# Patient Record
Sex: Male | Born: 1935 | Race: White | Hispanic: No | Marital: Married | Smoking: Never smoker
Health system: Southern US, Community
[De-identification: ages and names within clinical notes are randomized; demographics above are authoritative.]

## PROBLEM LIST (undated history)

## (undated) DIAGNOSIS — M1990 Unspecified osteoarthritis, unspecified site: Secondary | ICD-10-CM

## (undated) DIAGNOSIS — I10 Essential (primary) hypertension: Secondary | ICD-10-CM

## (undated) DIAGNOSIS — E785 Hyperlipidemia, unspecified: Secondary | ICD-10-CM

## (undated) DIAGNOSIS — I639 Cerebral infarction, unspecified: Secondary | ICD-10-CM

## (undated) DIAGNOSIS — E119 Type 2 diabetes mellitus without complications: Secondary | ICD-10-CM

## (undated) DIAGNOSIS — Z87898 Personal history of other specified conditions: Secondary | ICD-10-CM

## (undated) HISTORY — DX: Cerebral infarction, unspecified: I63.9

## (undated) HISTORY — PX: COLONOSCOPY: SHX174

## (undated) HISTORY — DX: Unspecified osteoarthritis, unspecified site: M19.90

## (undated) HISTORY — DX: Hyperlipidemia, unspecified: E78.5

## (undated) HISTORY — DX: Essential (primary) hypertension: I10

## (undated) HISTORY — DX: Personal history of other specified conditions: Z87.898

---

## 2001-12-04 ENCOUNTER — Ambulatory Visit (HOSPITAL_COMMUNITY): Admission: RE | Admit: 2001-12-04 | Discharge: 2001-12-04 | Payer: Self-pay | Admitting: Internal Medicine

## 2002-07-03 ENCOUNTER — Other Ambulatory Visit: Admission: RE | Admit: 2002-07-03 | Discharge: 2002-07-03 | Payer: Self-pay | Admitting: Urology

## 2003-05-14 ENCOUNTER — Ambulatory Visit (HOSPITAL_COMMUNITY): Admission: RE | Admit: 2003-05-14 | Discharge: 2003-05-14 | Payer: Self-pay | Admitting: Family Medicine

## 2003-05-17 ENCOUNTER — Ambulatory Visit (HOSPITAL_COMMUNITY): Admission: RE | Admit: 2003-05-17 | Discharge: 2003-05-17 | Payer: Self-pay | Admitting: Family Medicine

## 2005-01-11 ENCOUNTER — Ambulatory Visit: Payer: Self-pay | Admitting: Internal Medicine

## 2005-01-11 ENCOUNTER — Ambulatory Visit (HOSPITAL_COMMUNITY): Admission: RE | Admit: 2005-01-11 | Discharge: 2005-01-11 | Payer: Self-pay | Admitting: Internal Medicine

## 2005-02-23 ENCOUNTER — Ambulatory Visit: Payer: Self-pay | Admitting: Unknown Physician Specialty

## 2005-03-23 ENCOUNTER — Ambulatory Visit: Payer: Self-pay | Admitting: Unknown Physician Specialty

## 2005-03-30 ENCOUNTER — Emergency Department: Payer: Self-pay | Admitting: Emergency Medicine

## 2005-03-30 ENCOUNTER — Other Ambulatory Visit: Payer: Self-pay

## 2007-02-17 ENCOUNTER — Ambulatory Visit (HOSPITAL_COMMUNITY): Admission: RE | Admit: 2007-02-17 | Discharge: 2007-02-17 | Payer: Self-pay | Admitting: Family Medicine

## 2007-04-03 ENCOUNTER — Ambulatory Visit: Payer: Self-pay | Admitting: Cardiovascular Disease

## 2007-04-04 ENCOUNTER — Ambulatory Visit (HOSPITAL_COMMUNITY): Admission: RE | Admit: 2007-04-04 | Discharge: 2007-04-04 | Payer: Self-pay | Admitting: Cardiovascular Disease

## 2007-04-28 ENCOUNTER — Ambulatory Visit: Payer: Self-pay | Admitting: Cardiology

## 2007-04-28 ENCOUNTER — Encounter (HOSPITAL_COMMUNITY): Admission: RE | Admit: 2007-04-28 | Discharge: 2007-05-28 | Payer: Self-pay | Admitting: Cardiovascular Disease

## 2007-06-27 ENCOUNTER — Ambulatory Visit (HOSPITAL_COMMUNITY): Admission: RE | Admit: 2007-06-27 | Discharge: 2007-06-27 | Payer: Self-pay | Admitting: Family Medicine

## 2009-01-05 DIAGNOSIS — R079 Chest pain, unspecified: Secondary | ICD-10-CM | POA: Insufficient documentation

## 2010-06-01 ENCOUNTER — Observation Stay (HOSPITAL_COMMUNITY)
Admission: RE | Admit: 2010-06-01 | Discharge: 2010-06-02 | Disposition: A | Discharge status: Home / Self Care | Payer: Medicare Other | Source: Ambulatory Visit | Attending: Internal Medicine | Admitting: Internal Medicine

## 2010-06-01 DIAGNOSIS — R5383 Other fatigue: Secondary | ICD-10-CM | POA: Insufficient documentation

## 2010-06-01 DIAGNOSIS — M542 Cervicalgia: Secondary | ICD-10-CM | POA: Insufficient documentation

## 2010-06-01 DIAGNOSIS — Z79899 Other long term (current) drug therapy: Secondary | ICD-10-CM | POA: Insufficient documentation

## 2010-06-01 DIAGNOSIS — R209 Unspecified disturbances of skin sensation: Principal | ICD-10-CM | POA: Insufficient documentation

## 2010-06-01 DIAGNOSIS — K3189 Other diseases of stomach and duodenum: Secondary | ICD-10-CM | POA: Insufficient documentation

## 2010-06-01 DIAGNOSIS — Z8673 Personal history of transient ischemic attack (TIA), and cerebral infarction without residual deficits: Secondary | ICD-10-CM | POA: Insufficient documentation

## 2010-06-01 DIAGNOSIS — M1990 Unspecified osteoarthritis, unspecified site: Secondary | ICD-10-CM | POA: Insufficient documentation

## 2010-06-01 DIAGNOSIS — I10 Essential (primary) hypertension: Secondary | ICD-10-CM | POA: Insufficient documentation

## 2010-06-01 DIAGNOSIS — R51 Headache: Secondary | ICD-10-CM | POA: Insufficient documentation

## 2010-06-01 DIAGNOSIS — R5381 Other malaise: Secondary | ICD-10-CM | POA: Insufficient documentation

## 2010-06-02 ENCOUNTER — Observation Stay (HOSPITAL_COMMUNITY): Payer: Medicare Other

## 2010-06-02 ENCOUNTER — Emergency Department (HOSPITAL_COMMUNITY): Payer: Medicare Other

## 2010-06-02 DIAGNOSIS — I059 Rheumatic mitral valve disease, unspecified: Secondary | ICD-10-CM

## 2010-06-02 LAB — COMPREHENSIVE METABOLIC PANEL
ALT: 21 U/L (ref 0–53)
ALT: 22 U/L (ref 0–53)
AST: 18 U/L (ref 0–37)
AST: 19 U/L (ref 0–37)
Albumin: 3.7 g/dL (ref 3.5–5.2)
Albumin: 3.9 g/dL (ref 3.5–5.2)
Alkaline Phosphatase: 39 U/L (ref 39–117)
Alkaline Phosphatase: 41 U/L (ref 39–117)
BUN: 15 mg/dL (ref 6–23)
BUN: 17 mg/dL (ref 6–23)
CO2: 23 mEq/L (ref 19–32)
CO2: 27 mEq/L (ref 19–32)
Calcium: 9 mg/dL (ref 8.4–10.5)
Calcium: 9 mg/dL (ref 8.4–10.5)
Chloride: 104 mEq/L (ref 96–112)
Chloride: 105 mEq/L (ref 96–112)
Creatinine, Ser: 1.18 mg/dL (ref 0.4–1.5)
Creatinine, Ser: 1.24 mg/dL (ref 0.4–1.5)
GFR calc Af Amer: >60 mL/min (ref >60)
GFR calc non Af Amer: 56 mL/min — ABNORMAL LOW (ref >60)
GFR calc non Af Amer: 60 mL/min — ABNORMAL LOW (ref >60)
Glucose, Bld: 121 mg/dL — ABNORMAL HIGH (ref 70–99)
Glucose, Bld: 126 mg/dL — ABNORMAL HIGH (ref 70–99)
Potassium: 3.6 mEq/L (ref 3.5–5.1)
Potassium: 3.7 mEq/L (ref 3.5–5.1)
Sodium: 137 mEq/L (ref 135–145)
Sodium: 141 mEq/L (ref 135–145)
Total Bilirubin: 0.5 mg/dL (ref 0.3–1.2)
Total Bilirubin: 0.6 mg/dL (ref 0.3–1.2)
Total Protein: 6.6 g/dL (ref 6.0–8.3)
Total Protein: 6.9 g/dL (ref 6.0–8.3)

## 2010-06-02 LAB — DIFFERENTIAL
Basophils Absolute: 0 10*3/uL (ref 0.0–0.1)
Eosinophils Relative: 3 % (ref 0–5)
Lymphocytes Relative: 26 % (ref 12–46)
Monocytes Absolute: 0.6 10*3/uL (ref 0.1–1.0)
Monocytes Relative: 8 % (ref 3–12)
Neutro Abs: 5 10*3/uL (ref 1.7–7.7)

## 2010-06-02 LAB — CBC
HCT: 38.6 % — ABNORMAL LOW (ref 39.0–52.0)
HCT: 41.2 % (ref 39.0–52.0)
Hemoglobin: 13.6 g/dL (ref 13.0–17.0)
Hemoglobin: 14.1 g/dL (ref 13.0–17.0)
MCH: 31.1 pg (ref 26.0–34.0)
MCH: 31.8 pg (ref 26.0–34.0)
MCHC: 34.2 g/dL (ref 30.0–36.0)
MCHC: 35.2 g/dL (ref 30.0–36.0)
MCV: 90.2 fL (ref 78.0–100.0)
Platelets: 276 10*3/uL (ref 150–400)
RBC: 4.28 MIL/uL (ref 4.22–5.81)
RDW: 13 % (ref 11.5–15.5)
WBC: 8.1 10*3/uL (ref 4.0–10.5)

## 2010-06-02 LAB — PROTIME-INR
INR: 0.92 (ref 0.00–1.49)
INR: 0.9 (ref 0.00–1.49)
Prothrombin Time: 12.6 seconds (ref 11.6–15.2)
Prothrombin Time: 12.4 seconds (ref 11.6–15.2)

## 2010-06-02 LAB — POCT CARDIAC MARKERS
CKMB, poc: <1 ng/mL — ABNORMAL LOW (ref 1.0–8.0)
Myoglobin, poc: 69.1 ng/mL (ref 12–200)
Troponin i, poc: <0.05 ng/mL (ref 0.00–0.09)
Troponin i, poc: <0.05 ng/mL (ref 0.00–0.09)

## 2010-06-02 LAB — CARDIAC PANEL(CRET KIN+CKTOT+MB+TROPI)
CK, MB: 2.3 ng/mL (ref 0.3–4.0)
Total CK: 114 U/L (ref 7–232)
Troponin I: 0.01 ng/mL (ref 0.00–0.06)

## 2010-06-02 LAB — URINALYSIS, ROUTINE W REFLEX MICROSCOPIC
Hgb urine dipstick: NEGATIVE
Ketones, ur: NEGATIVE mg/dL
Nitrite: NEGATIVE
Protein, ur: NEGATIVE mg/dL
Specific Gravity, Urine: 1.01 (ref 1.005–1.030)

## 2010-06-02 LAB — LIPID PANEL
Cholesterol: 219 mg/dL — ABNORMAL HIGH (ref 0–200)
HDL: 56 mg/dL (ref >39)
LDL Cholesterol: 146 mg/dL — ABNORMAL HIGH (ref 0–99)
Total CHOL/HDL Ratio: 3.9 RATIO
Triglycerides: 84 mg/dL (ref <150)
VLDL: 17 mg/dL (ref 0–40)

## 2010-06-11 NOTE — Discharge Summary (Signed)
NAMEKANIEL, Mark Brady             ACCOUNT NO.:  [id number]  MEDICAL RECORD NO.:  [id number]           PATIENT TYPE:  I  LOCATION:  A338                          FACILITY:  [facility]  PHYSICIAN:  Alaysiah Browder L. Lendell [name], MDDATE OF BIRTH:  Feb 29, 1930  DATE OF ADMISSION:  06/01/2010 DATE OF DISCHARGE:  02/17/2012LH                              DISCHARGE SUMMARY   DISCHARGE DIAGNOSES: 1. Transient left arm numbness, possible transient ischemic attack. 2. Untreated hypertension. 3. 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 4. Osteoarthritis.  DISCHARGE MEDICATIONS: 1. Aspirin 325 mg a day. 2. Hydrochlorothiazide 12.5 mg a day. 3. Multivitamin a day. 4. Tylenol 650 mg p.o. q.4 h. p.r.n. pain. 5. Stop Aleve.  CONDITION:  Stable.  FOLLOWUP:  Follow up with Dr. Lubertha South in 1-2 weeks.  His fasting lipid panel and echocardiogram are pending.  Please check basic metabolic panel and blood pressure.  ACTIVITY:  Ad lib.  DIET:  Should be heart healthy.  CONSULTATIONS:  None.  PROCEDURES:  None.  LABORATORY DATA:  CBC normal.  PT/PTT normal.  Complete metabolic panel unremarkable.  Cardiac enzymes negative.  Urinalysis negative.  DIAGNOSTICS:  EKG shows possible left atrial enlargement. Echocardiogram pending.  CT of the head without contrast showed nothing acute, chronic microvascular ischemic change.  Chest x-ray showed nothing acute.  MRI of the brain without contrast showed nothing acute, old infarct of affecting the left basal ganglia and centrum semiovale. MRA of the brain showed no major vessel occlusion.  Distal right vertebral artery diminutive.  MRI of the C-spine showed small focal central disk protrusion at C2-3, bilateral foraminal stenosis, right greater than left in C3-4, mild-to-moderate left foraminal stenosis at C4-5, diffuse bulging annulus and osteophytic ridging at C5-6, mild right foraminal encroachment, shallow broad-based disk protrusion at C6- 7  with mild left foraminal encroachment.  Carotid Dopplers showed less than 50% carotid stenosis.  HISTORY AND HOSPITAL COURSE:  Please see H and P for details.  Mark Brady is a 77 year old white male with untreated hypertension, who presented with about a half an hour of left arm numbness and diaphoresis.  His symptoms had resolved by the time he was evaluated by the hospitalist.  He was worked up for TIA and echocardiogram and fasting lipids at this time pending.  His blood pressure was high when he arrived and remained 160-180 systolic.  He is very resistant to taking any medications but agrees to try an aspirin a day and he will think about taking hydrochlorothiazide but at this point is unsure whether he will fill the prescription.  He is said on getting his blood pressure down with vitamins.  I told him that this would increase his risk of stroke.  He does agree to follow up with Dr. Gerda Diss, next week. If he does in fact take hydrochlorothiazide he will need a basic metabolic check and blood pressure monitoring.  He reported that he has nosebleeds with aspirin but would try it again.  He had some osteoarthritis and mild-to-moderate foraminal stenosis on MRI of the neck, but nothing to explain his symptoms.  He had no recurrence of his symptoms  and had a normal neurologic exam.  He is requesting discharge at this time and his results may not be back for a day or more.  Since he has been stable, I will have him follow up these results with his primary care physician.     Mark Brady L. Lendell [name], MD     CLS/MEDQ  D:  06/02/2010  T:  06/03/2010  Job:  [id number]  cc:   [name], M.D. Fax: [phone]  Electronically Signed by Crista Curb MD on 06/11/2010 09:25:50 PM

## 2010-07-02 NOTE — H&P (Signed)
Mark Brady, Mark Brady             ACCOUNT NO.:  [id number]  MEDICAL RECORD NO.:  [id number]           PATIENT TYPE:  LOCATION:                                 FACILITY:  PHYSICIAN:  [name], MD     DATE OF BIRTH:  Feb 04, 1930  DATE OF ADMISSION:  06/02/2010 DATE OF DISCHARGE:  LH                             HISTORY & PHYSICAL   PRIMARY CARE PHYSICIAN:  [name], MD  ADMISSION DIAGNOSES: 1. Left arm tingling, numbness with diaphoresis, possible transient     ischemic attack. 2. High blood pressure, not on medications. 3. Indigestion, possible gastroesophageal reflux disease.  CHIEF COMPLAINT:  Left arm numbness tonight.  HISTORY OF PRESENT ILLNESS:  The patient is a 77 year old Caucasian male who has a history of hypertension, but is not on any medications, he is otherwise in good state of health, who was in his usual state of health until about 10:15 last night when he went to bed and at about 10:30, he woke up, feeling hot, diaphoretic.  According to his wife, he was clammy, he felt weak.  He woke up with some burping and more importantly had left arm tingling and shoulder pain.  The tingling was, he said from the hands and the fingers up to the mid upper arm.  He also had left shoulder pain.  He said he was in lying on that arm.  He was actually lying on the right side.  He does sometimes get pain in the neck and some left hand tingling, but this one was more intense compared to his previous episodes.  He did not have any focal weakness or any seizure activity, however, felt a generalized weakness.  He walked around.  He went outside, opened the door to get some fresh air.  He then also felt some indigestion.  Denies any headaches, speech difficulty, or vision difficulties.  All these symptoms resolved after about half an hour.  He mentioned that he did not have any chest pain or shortness of breath. For his indigestion, he has been taking Tums with which he  has achieved relief of the complaint.  MEDICATIONS AT HOME:  He does not take anything scheduled.  He takes Tums as needed.  He took Aleve a couple of days this week for arthritis, otherwise denies any other medication.  He also took aspirin tonight.  ALLERGIES:  No known drug allergies.  PAST MEDICAL HISTORY:  Just history of hypertension for which he is not on any treatment at this time.  PAST SURGICAL HISTORY:  Vasectomy.  SOCIAL HISTORY:  Lives in [place] with his wife.  He is semi-retired. He works in a [employer] developer.  He denies smoking, alcohol, or illicit drug use.  He is usually independent with his daily activities.  He walks every morning about 3/4 of a mile to a mile and does not get any current chest pain or shortness of breath.  FAMILY HISTORY:  Unremarkable per the patient.  REVIEW OF SYSTEMS:  GENERAL:  Positive for weakness and malaise.  HEENT: Unremarkable.  CARDIOVASCULAR:  As in HPI.  RESPIRATORY: As in HPI.  GI: Unremarkable.  GU: Unremarkable.  NEUROLOGICAL:  As in HPI. PSYCHIATRIC:  Unremarkable.  DERMATOLOGIC:  Unremarkable.  Other systems reviewed and found to be negative.  PHYSICAL EXAMINATION:  VITAL SIGNS:  Temperature of 98.8; blood pressure when he came in was 183/91, subsequently 160s/80s; heart rate 80; respiratory rate 18; and saturation 98% on room air. GENERAL:  An elderly thin white male, in no distress, slightly anxious. HEENT:  Head is normocephalic and atraumatic.  Pupils are equal and reacting.  No pallor.  No icterus.  Oral mucous membranes moist.  No oral lesions are noted. NECK:  Soft and supple.  No reproducibility of symptoms with flexion, extension, or rotation of the neck.  No thyromegaly is present.  No cervical, supraclavicular, or inguinal lymphadenopathy is present. LUNGS:  Clear to auscultation bilaterally with no wheezing, rales, or rhonchi. CARDIOVASCULAR:  S1 and S2 is normal and regular.  Peripheral pulses  are palpable.  No S3 or S4.  No JVD.  No pedal edema.  No bruits. ABDOMEN:  Soft, nontender, and nondistended.  Bowel sounds are present. No masses or organomegaly is appreciated. MUSCULOSKELETAL:  Normal muscle mass and tone. NEUROLOGIC:  He is alert and oriented x3.  Cranial nerves are intact. Motor strength equal bilaterally.  No pronator drift identified. Dysdiadochokinesis was about equal bilaterally.  Finger-to-nose was a little bit unsteady on the left side.  Sensory exam unremarkable. SKIN:  Does not reveal any rashes.  LABORATORY DATA:  His CBC was benign and unremarkable.  His CMET was also unremarkable.  UA was clear.  Cardiac enzymes were negative.  IMAGING STUDIES:  He had a CT head which showed chronic microvascular ischemic changes without any acute findings.  He had a chest x-ray which showed no acute disease.  He had an EKG which showed sinus rhythm at 77, appears to be a possible left axis deviation.  No Q-waves.  No concerning ST changes, possible RBBBs noted.  No older EKGs available for comparison.  There was a cardiology note from 2008, which said that the EKG was essentially normal with possible left atrial enlargement.  ASSESSMENT:  This is a 77 year old Caucasian male who has high blood pressure for which he does not seek treatment, presents with left arm numbness, tingling, and some diaphoresis.  The exact reason for his symptomatology is not entirely clear.  He could have definitely had a transient ischemic attack.  He could have some cervical disc problems, it could be causing his symptoms.  However, he tells me that he has never experienced the diaphoresis ever in the past.  Denies any chest pain.  I do not think this is any kind of coronary ischemia, although it is difficult to rule out.  His indigestion is probably acid reflux.  PLAN: 1. Left arm numbness, possible TIA.  We will go ahead and do a TIA     workup.  Put him on aspirin and see what the  workup shows.  We will     also get an MRI of his cervical spine to see if there is any     disease which could be causing symptomatology. 2. Diaphoresis and indigestion.  We will rule him out for acute     coronary syndrome.  We will get an echocardiogram as a part of the     TIA workup.  EKG will be repeated, although I suspect this is     probably not cardiac.  He has had 2 stress tests, one in 2005  and     the other in 2008, both of which were normal and his EF was normal     as well. 3. He will be put on Zantac for his indigestion. 4. High blood pressure.  We will monitor his blood pressures during     hospitalization.  I would not start him on anything at this point     depending on what his blood pressures do over the next 24 hours.     Consideration to treatment can be given.  At this point, there is no reason for any inpatient consultation on this patient unless symptoms get worse, recur, or any of his testings come abnormal.  I think once the testing comes back okay, he can be safely discharged to the care of his PCP who can arrange a referral to specialists as needed. DVT prophylaxis will be initiated.  Further management decisions will depend on results of further testing and patient's response to treatment.  [name], MD     GK/MEDQ  D:  06/02/2010  T:  06/02/2010  Job:  [id number]  cc:   [name], M.D. Fax: [phone]  Electronically Signed by [name] MD on 07/02/2010 06:29:26 AM

## 2010-08-29 NOTE — Assessment & Plan Note (Signed)
[facility]                       [facility] OFFICE NOTE   NAME:Mark Brady, Mark Brady                    MRN:          [id number]  DATE:04/03/2007                            DOB:          10-Apr-1930    The patient is a pleasant 77 year old patient of Dr. Gerda Diss.  He is  referred for chest pain.   The pain is atypical.  It has been going on for a few months.  It is  actually described more as left lower quadrant pain.  The pain is  clearly exacerbated by eating.  It was can sometimes be followed by a  lot of belching.  There is no previous history of reflux or peptic ulcer  disease.  No history of diverticulitis.  The patient recently has been  placed on Cipro for question prostatitis.   From a cardiac perspective, he has not had known coronary artery  disease, rheumatic disease, or valvular disease.  He does not recall  having any recent workup. He is generally healthy.  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  He has not had any significant palpitations or  syncope.  There has been no PND, orthopnea, and no diaphoresis.  The  left lower quadrant pain is actually somewhat worse over the last few  weeks.  He has not had any diarrhea or melena.  He had a previous  colonoscopy back in 2004, but no recent GI workup.   REVIEW OF SYSTEMS:  Otherwise negative.   PAST MEDICAL HISTORY:  Benign.  He has never been hospitalized.  He has  not had surgery.  He had a colonoscopy back in 2004.   ALLERGIES:  No known drug allergies.   MEDICATIONS:  1. Vitamin C.  2. Androxy.  3. Arthrozyme.  4. Omega vitamins.  5. Cipro 500 mg b.i.d. lately.   SOCIAL HISTORY:  The patient has been married three times.  Unfortunately one of his daughters died last year and he is helping to  raise two of his grandchildren.  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  He enjoys walking and fishing.   FAMILY HISTORY:  Unremarkable.  Father died at age 18 of  natural causes.  Mother died of a hemorrhage in her 8's.   REVIEW OF SYSTEMS:  Otherwise negative.   PHYSICAL EXAMINATION:  GENERAL:  Remarkable for a healthy appearing,  elderly white male in no distress.  VITAL SIGNS:  Weight is 185, blood pressure 150/80, pulse 73 and  regular.  HEENT:  Unremarkable.  Carotids normal without bruit.  No  lymphadenopathy, thyromegaly, or JVP elevation.  LUNGS:  Clear with good diaphragmatic motion.  No wheezing.  HEART:  S1 and S2 normal heart sounds.  PMI normal.  ABDOMEN:  Benign.  Bowel sounds positive.  I cannot elicit any pain in  the left lower quadrant.  There are no masses, no AAA, no bruit, and no  hepatosplenomegaly or hepatojugular reflux.  EXTREMITIES:  Distal pulses are intact.  No edema.  NEUROLOGY:  Nonfocal.  SKIN:  Warm and dry.  No muscular weakness.   EKG is essentially normal  with possible left atrial enlargement.   IMPRESSION:  1. Chest pain actually more left lower quadrant pain.  I doubt that      this is angina, however, given his age it may be an unusual      presentation.  I think it is reasonable to proceed with a stress      Myoview.  2. Left lower quadrant pain exacerbated by food, more likely to be      diverticulitis.  We will order a CT of the abdomen with oral      contrast.  Follow up with Dr. Gerda Diss.  3. Arthritis.  Continue Arthrozyme daily.  Continue walking program.  4. Prostatitis.  Currently on Cipro.  Consider addition of Flomax or      Avodart.  Follow up with urologist and primary M.D. for digital      exams. Overall, I think that the patient is doing well and I      suspect his stress test will be nonischemic as his pain clearly      seems more localized to the left lower quadrant and exacerbated by      food.     Noralyn Pick. Eden Emms, MD, [facility]  Electronically Signed    PCN/MedQ  DD: 04/03/2007  DT: 04/04/2007  Job #: [id number]   cc:   [name], M.D.

## 2010-09-01 NOTE — Procedures (Signed)
NAMEJERROL, Mark Brady NO.:  [id number]   MEDICAL RECORD NO.:  [id number]                  PATIENT TYPE:   LOCATION:                                       FACILITY:   PHYSICIAN:  [name], M.D.             DATE OF BIRTH:   DATE OF PROCEDURE:  05/14/2003  DATE OF DISCHARGE:                                    STRESS TEST   INDICATION FOR TEST:  This patient is a 77 year old white male with atypical  chest discomfort.   STRESS TEST:  Stress test was performed at standard protocol.  Resting EKG  revealed normal sinus rhythm with no significant ST-T changes.  The patient  tolerated the first stage well.  Into the second stage, he began to develop  some shortness of breath.  He had an occasional PVC during the second stage  also.  The patient experienced no chest discomfort.  At the start of the  third stage, the patient had reached a heart rate of 129 which surpassed his  __________ max predicted heart rate of 127.  At this point, there was quite  a bit of artifact.  Most useful was the EKG obtained immediately after  cessation of the test.  This EKG revealed no significant ST changes.  At  0.08 seconds past the J point, the slope was sharply ascending.  Review of  the last EKG obtained before stopping the stress test showed the computer  reading some significant ST segment changes; however, this appears to be  artifact and not actual.  The accurate tracing shows a sharply ascending ST  segment at 0.08 second past the J point, and, therefore, a within normal  limit test.   IMPRESSION:  Negative adequate stress test.   PLAN:  The patient was advised to continue with other therapies as  recommended.      ___________________________________________                                            [name], M.D.   WSL/MEDQ  D:  05/25/2003  T:  05/26/2003  Job:  [id number]

## 2010-09-01 NOTE — Op Note (Signed)
Mark Brady, Mark Brady                       ACCOUNT NO.:  [id number]   MEDICAL RECORD NO.:  [id number]                   PATIENT TYPE:  AMB   LOCATION:  DAY                                  FACILITY:  [facility]   PHYSICIAN:  Gerrit Friends. Rourk, M.D.               DATE OF BIRTH:  June 25, 1929   DATE OF PROCEDURE:  12/04/2001  DATE OF DISCHARGE:                                 OPERATIVE REPORT   PROCEDURE:  Screening colonoscopy.   ENDOSCOPIST:  Gerrit Friends. Rourk, M.D.   INDICATIONS FOR PROCEDURE:  The patient is a 77 year old gentleman devoid of  any lower GI tract symptoms.  No family history of colorectal neoplasia.  He  has never had his lower GI tract imaged.  Not referred for screening  colonoscopy.  The colonoscopy is now being done as a standard screening  maneuver.  This approach has been discussed with the patient at length.  The  potential risks, benefits, and alternatives have been requested; and  questions answered.  Please see my handwritten H&P for more information.  I  feel that he is low risk for conscious sedation in the way of Versed and  Demerol.   DESCRIPTION OF PROCEDURE:  O2 saturation, blood pressure, pulse and  respirations were monitored throughout the entire procedure.   CONSCIOUS SEDATION:  Versed 2 mg IV, Demerol 50 mg IV.   INSTRUMENT:  Olympus video chip adult colonoscope.   FINDINGS:  Digital rectal examination revealed no abnormalities.   ENDOSCOPIC FINDINGS:  The prep was good.   RECTUM:  Examination of the rectal mucosa including the retroflex view of  the anal verge revealed no abnormalities.   COLON:  The colonic mucosa was surveyed from the rectosigmoid junction  through the left transverse and right colon to the area of the appendiceal  orifice, ileocecal valve, and cecum.  These structures were well seen and  photographed for the record.  The patient had scant left-sided diverticula;  and a 3 mm polyp on a fold in the cecum.  The cecal polyp  was cold-  biopsied/removed.   From the level of the cecum and ileocecal valve the scope was slowly  withdrawn.  All previously mentioned mucosal surfaces were again seen; and,  again, no other abnormalities were observed.  The patient tolerated the  procedure well and was reacted in endoscopy.   IMPRESSION:  1. Normal rectum.  2. Left-sided diverticular diminutive polyp in the cecum, removed with cold     biopsy forceps  The remainder of the colonic mucosa appeared normal.   RECOMMENDATIONS:  1. Diverticulosis literature provided to the patient.  2. Follow up on path.  3. Further recommendations to follow.  Gerrit Friends. Rourk, M.D.    RMR/MEDQ  D:  12/04/2001  T:  12/05/2001  Job:  [id number]   cc:   Dr. Gerda Diss

## 2010-09-01 NOTE — Op Note (Signed)
Brady, Mark             ACCOUNT NO.:  [id number]   MEDICAL RECORD NO.:  [id number]          PATIENT TYPE:  AMB   LOCATION:  DAY                           FACILITY:  [facility]   PHYSICIAN:  R. Roetta Sessions, M.D. DATE OF BIRTH:  September 24, 1929   DATE OF PROCEDURE:  01/11/2005  DATE OF DISCHARGE:                                 OPERATIVE REPORT   PROCEDURE:  Surveillance colonoscopy with cold biopsy ablation of lesion.   INDICATIONS FOR PROCEDURE:  The patient is a 77 year old gentleman with a  history of cecal adenoma removed in 2003. He has done well. No GI symptoms  _____________ medical problems since that time. He is here for surveillance.  This approach has been discussed with the patient at length. Potential  risks, benefits, and alternatives have been reviewed and questions answered.  He is agreeable. Please see documentation in the medical record.   PROCEDURE NOTE:  O2 saturation, blood pressure, pulse, and respirations were  monitored throughout the entire procedure. Conscious sedation with Versed 3  mg IV and Demerol 50 mg IV in divided doses.   INSTRUMENT:  Olympus video chip system.   FINDINGS:  Digital rectal exam revealed no abnormalities.   ENDOSCOPIC FINDINGS:  Prep was good.   Rectum:  Examination of the rectal mucosa including retroflexed view of the  anal verge revealed no abnormalities.   Colon:  Colonic mucosa was surveyed from the rectosigmoid junction through  the left, transverse, and right colon to the area of the appendiceal  orifice, ileocecal valve, and cecum. These structures were well seen and  photographed for the record. From this level, the scope was slowly  withdrawn, and all previously mentioned mucosal surfaces were again seen.  The patient was noted to have pancolonic diverticula. The only other  abnormality was a 3-mm polyp at the splenic flexure which was cold biopsied  ablated with this maneuver. The remainder of the colonic mucosa  appeared  normal. The patient tolerated the procedure well and was reactive to  endoscopy.   IMPRESSION:  1.  Normal rectum.  2.  Pancolonic diverticula with diminutive polyp in the splenic flexure      ablated with cold biopsy technique. The remainder of the colonic mucosa      appeared normal.   RECOMMENDATIONS:  1.  Diverticulosis literature provided to Mr. Abello.  2.  Recommend repeat colonoscopy in five years.      Jonathon Bellows, M.D.  Electronically Signed     RMR/MEDQ  D:  01/11/2005  T:  01/11/2005  Job:  [id number]   cc:   [facility]

## 2011-06-05 ENCOUNTER — Encounter (HOSPITAL_COMMUNITY): Payer: Self-pay | Admitting: Dietician

## 2011-06-05 NOTE — Progress Notes (Signed)
[facility] Diabetes Class Completion  Date:June 05, 2011  Time: 10:00 AM  Pt attended [facility]'s Diabetes Class on June 05, 2011.   Patient was educated on the following topics: carbohydrate metabolism in relation to diabetes, sources of carbohydrate, carbohydrate counting, meal planning strategies, food label reading, and portion control.   Chamya Hunton A. Kayan, RD, LDN Date:June 05, 2011 Time: 10:00 AM 

## 2012-01-25 ENCOUNTER — Ambulatory Visit (HOSPITAL_COMMUNITY)
Admission: RE | Admit: 2012-01-25 | Discharge: 2012-01-25 | Disposition: A | Discharge status: Home / Self Care | Payer: Medicare Other | Source: Ambulatory Visit | Attending: Family Medicine | Admitting: Family Medicine

## 2012-01-25 ENCOUNTER — Other Ambulatory Visit: Payer: Self-pay | Admitting: Family Medicine

## 2012-01-25 DIAGNOSIS — M79651 Pain in right thigh: Secondary | ICD-10-CM

## 2012-01-25 DIAGNOSIS — M1710 Unilateral primary osteoarthritis, unspecified knee: Secondary | ICD-10-CM | POA: Insufficient documentation

## 2012-01-25 DIAGNOSIS — M25469 Effusion, unspecified knee: Secondary | ICD-10-CM | POA: Insufficient documentation

## 2012-03-25 ENCOUNTER — Ambulatory Visit (INDEPENDENT_AMBULATORY_CARE_PROVIDER_SITE_OTHER): Payer: Medicare Other | Admitting: Orthopedic Surgery

## 2012-03-25 ENCOUNTER — Encounter: Payer: Self-pay | Admitting: Orthopedic Surgery

## 2012-03-25 ENCOUNTER — Ambulatory Visit (INDEPENDENT_AMBULATORY_CARE_PROVIDER_SITE_OTHER): Payer: Medicare Other

## 2012-03-25 VITALS — BP 120/76 | Ht 67.0 in | Wt 178.0 lb

## 2012-03-25 DIAGNOSIS — M79609 Pain in unspecified limb: Secondary | ICD-10-CM

## 2012-03-25 DIAGNOSIS — M79606 Pain in leg, unspecified: Secondary | ICD-10-CM | POA: Insufficient documentation

## 2012-03-25 NOTE — Patient Instructions (Signed)
activities as tolerated 

## 2012-03-25 NOTE — Progress Notes (Signed)
Patient ID: Mark Brady, male   DOB: 08/12/1929, 78 y.o.   MRN: [id number] Chief Complaint  Patient presents with  . Leg Pain    Right thigh pain x 3 months no injury ; Dr. 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 He complains of throbbing 8/10 intermittent pain especially after being on his feet for approximately 2 hours. He definitely temporally related to 2 hours. The muscle behind her right eye became tight hip pain in the back of his knee and pain radiating up the back of his leg. He denies numbness tingling locking catching, back pain. He denies anterior thigh pain or groin pain.  He had x-rays of his knee and hip mild arthritis was noted.  Review of systems history of heartburn  All other systems are normal  Medical history reviewed and recorded  Past Medical History  Diagnosis Date  . Hypertension     Physical Exam(12)  Vital signs:   GENERAL: normal development   CDV: pulses are normal   Skin: normal  Lymph: nodes were not palpable/normal  Psychiatric: awake, alert and oriented  Neuro: normal sensation  MSK  Gait: Normal ambulation 1 Inspection on inspection he has no tenderness pain or swelling in the hip knee or posterior thigh or posterior knee at this time 2 Range of Motion his knee and hip range of motion remained within normal limits he does have some restriction in internal rotation of the hip but no pain. We noticed tight popliteal angles bilaterally 3 Motor normal 4 Stability normal stability in the hip and the knee  Other side:  5 normal range of motion strength stability and alignment of the hip, except for decreased internal rotation which is pain-free. 6 again popliteal angle tightness is noted  Negative straight leg raises bilaterally  Lumbar spine nontender  Imaging imaging  includes a hip film and the film and they were both fairly unremarkable except for some mild degenerative changes  Assessment: Difficult to tell at this point we took an x-ray of his lumbar spine he has some mild degenerative changes there is well. He says is getting better I think it was most likely nerve irritation in the back of the leg related to what sounds like some early spinal stenosis-like symptoms as he can related to 2 hours of being up on his feet. I think the nerve irritation causing muscle spasms creating than not they felt in the back of his thigh. I do not feel that his knee needs any treatment and his hip looks good.      Plan: If this flares up again he is to call the office. We can prescribe some muscle relaxers and Neurontin and prednisone if needed.

## 2012-08-21 ENCOUNTER — Emergency Department (HOSPITAL_COMMUNITY)
Admission: EM | Admit: 2012-08-21 | Discharge: 2012-08-21 | Disposition: A | Discharge status: Home / Self Care | Payer: Medicare Other | Attending: Emergency Medicine | Admitting: Emergency Medicine

## 2012-08-21 ENCOUNTER — Encounter (HOSPITAL_COMMUNITY): Payer: Self-pay

## 2012-08-21 DIAGNOSIS — Z7982 Long term (current) use of aspirin: Secondary | ICD-10-CM | POA: Insufficient documentation

## 2012-08-21 DIAGNOSIS — R202 Paresthesia of skin: Secondary | ICD-10-CM

## 2012-08-21 DIAGNOSIS — I10 Essential (primary) hypertension: Secondary | ICD-10-CM | POA: Insufficient documentation

## 2012-08-21 DIAGNOSIS — R209 Unspecified disturbances of skin sensation: Secondary | ICD-10-CM | POA: Insufficient documentation

## 2012-08-21 DIAGNOSIS — K30 Functional dyspepsia: Secondary | ICD-10-CM

## 2012-08-21 DIAGNOSIS — K3189 Other diseases of stomach and duodenum: Secondary | ICD-10-CM | POA: Insufficient documentation

## 2012-08-21 MED ORDER — IBUPROFEN 400 MG PO TABS
200.0000 mg | ORAL_TABLET | Freq: Once | ORAL | Status: AC
  Administered 2012-08-21: 200 mg via ORAL
  Filled 2012-08-21: qty 1

## 2012-08-21 NOTE — ED Provider Notes (Signed)
History     CSN: [id number]  Arrival date & time 08/21/12  0508   First MD Initiated Contact with Patient 08/21/12 [phone]      Chief Complaint  Patient presents with  . Arm Pain    (Consider location/radiation/quality/duration/timing/severity/associated sxs/prior treatment) HPI HPI Comments: Mark Brady is a 79 y.o. male who presents to the Emergency Department complaining of left arm tingling that has been present for several years and is worse over the last week. It is worse at night. Tingling like its ready to go to sleep. He has a h/o bone spurs to his neck. He has taken no medicines. He denies dropping any item, inability to use his hand or lift his arm, pain in the arm. He also has had an increase in heartburn over the last few days that responds to OTC antacid. Denies fever, chills, chest pain, shortness of breath.  PCP Dr. Gerda Diss  Past Medical History  Diagnosis Date  . Hypertension     History reviewed. No pertinent past surgical history.  Family History  Problem Relation Age of Onset  . Arthritis      History  Substance Use Topics  . Smoking status: Never Smoker   . Smokeless tobacco: Not on file  . Alcohol Use: No      Review of Systems  Constitutional: Negative for fever.       10 Systems reviewed and are negative for acute change except as noted in the HPI.  HENT: Negative for congestion.   Eyes: Negative for discharge and redness.  Respiratory: Negative for cough and shortness of breath.   Cardiovascular: Negative for chest pain.  Gastrointestinal: Negative for vomiting and abdominal pain.  Musculoskeletal: Negative for back pain.       Left arm tingling  Skin: Negative for rash.  Neurological: Negative for syncope, numbness and headaches.  Psychiatric/Behavioral:       No behavior change.    Allergies  Review of patient's allergies indicates no known allergies.  Home Medications   Current Outpatient Rx  Name  Route  Sig  Dispense   Refill  . aspirin 81 MG chewable tablet   Oral   Chew 81 mg by mouth daily.         Marland Kitchen triamterene-hydrochlorothiazide (DYAZIDE) 37.5-25 MG per capsule                 BP 165/83  Pulse 70  Temp(Src) 98.3 F (36.8 C) (Oral)  Resp 18  Ht 5\' 7"  (1.702 m)  Wt 175 lb (79.379 kg)  BMI 27.4 kg/m2  SpO2 97%  Physical Exam  Nursing note and vitals reviewed. Constitutional: He appears well-developed and well-nourished.  Awake, alert, nontoxic appearance.  HENT:  Head: Normocephalic and atraumatic.  Eyes: EOM are normal. Pupils are equal, round, and reactive to light.  Neck: Normal range of motion. Neck supple.  Cardiovascular: Normal rate and intact distal pulses.   Pulmonary/Chest: Effort normal and breath sounds normal. He exhibits no tenderness.  Abdominal: Soft. Bowel sounds are normal. There is no tenderness. There is no rebound.  Musculoskeletal: He exhibits no tenderness.  Baseline ROM, no obvious new focal weakness.Right handed. No pronator drift. Normal sensation to light touch in both arms and hands. Pulses 2+ left and right.  Neurological:  Mental status and motor strength appears baseline for patient and situation.  Skin: No rash noted.  Psychiatric: He has a normal mood and affect.    ED Course  Procedures (including critical care  time)   Date: 08/21/2012   0520  Rate: 71  Rhythm: normal sinus rhythm and with sinus arrhythmia  QRS Axis: left  Intervals: normal  ST/T Wave abnormalities: normal  Conduction Disutrbances:none  Narrative Interpretation: cannot rule out anterior infarct, age undetermined  Old EKG Reviewed: none available    MDM  Patient here with left arm tingling that has been more pronounced in the last few days. He has had similar tingling over the years. He is also here for indigestion that has been worse over the past two days and responds to OTC antacid. PE is normal. Discussed use of a single ibuprofen at bedtime for his arm tingling.  Continued use of the antacid for a week. Follow up with his PCP. Pt stable in ED with no significant deterioration in condition.The patient appears reasonably screened and/or stabilized for discharge and I doubt any other medical condition or other [facility] requiring further screening, evaluation, or treatment in the ED at this time prior to discharge.  MDM Reviewed: nursing note and vitals           Nicoletta Dress. Colon Branch, MD 08/21/12 [phone]

## 2012-08-21 NOTE — ED Notes (Signed)
Pt arrives with c/o pain and numbness to left arm.  Pt states he has been dealing with this for several years but is worse recently

## 2012-09-22 ENCOUNTER — Encounter: Payer: Self-pay | Admitting: *Deleted

## 2012-09-22 ENCOUNTER — Telehealth: Payer: Self-pay | Admitting: Family Medicine

## 2012-09-22 DIAGNOSIS — Z79899 Other long term (current) drug therapy: Secondary | ICD-10-CM

## 2012-09-22 DIAGNOSIS — I10 Essential (primary) hypertension: Secondary | ICD-10-CM

## 2012-09-22 NOTE — Telephone Encounter (Signed)
Pt would like bw papers, please mail to patients home

## 2012-09-22 NOTE — Telephone Encounter (Signed)
Lip liv met7 plus ov

## 2012-09-23 NOTE — Telephone Encounter (Signed)
bloodwork papers put in the mail for patient

## 2012-09-26 ENCOUNTER — Ambulatory Visit (INDEPENDENT_AMBULATORY_CARE_PROVIDER_SITE_OTHER): Payer: Medicare Other | Admitting: Family Medicine

## 2012-09-26 ENCOUNTER — Encounter: Payer: Self-pay | Admitting: Family Medicine

## 2012-09-26 VITALS — BP 130/80 | HR 70 | Ht 67.0 in | Wt 178.2 lb

## 2012-09-26 DIAGNOSIS — R7301 Impaired fasting glucose: Secondary | ICD-10-CM

## 2012-09-26 DIAGNOSIS — E785 Hyperlipidemia, unspecified: Secondary | ICD-10-CM

## 2012-09-26 DIAGNOSIS — I6350 Cerebral infarction due to unspecified occlusion or stenosis of unspecified cerebral artery: Secondary | ICD-10-CM

## 2012-09-26 DIAGNOSIS — I639 Cerebral infarction, unspecified: Secondary | ICD-10-CM

## 2012-09-26 DIAGNOSIS — I10 Essential (primary) hypertension: Secondary | ICD-10-CM

## 2012-09-26 MED ORDER — TRIAMTERENE-HCTZ 37.5-25 MG PO CAPS: 1.0000 | ORAL_CAPSULE | Freq: Every day | ORAL | Status: DC

## 2012-09-26 NOTE — Progress Notes (Signed)
  Subjective:    Patient ID: Mark Brady, male    DOB: [birth year], 79 y.o.   MRN: [id number]  Hypertension The current episode started more than 1 year ago. The problem is unchanged. The problem is controlled. Pertinent negatives include no anxiety or chest pain. There are no associated agents to hypertension. There are no known risk factors for coronary artery disease. Past treatments include diuretics. The current treatment provides moderate improvement. Compliance problems include diet and exercise.    Patient has had no further spells associated with stroke. No TIA symptoms.  Patient claims he is trying to watch his fat intake. In addition he has also work hard on maintaining good control of sugar in his diet with his history.   Review of Systems  Cardiovascular: Negative for chest pain.   No headache no abdominal pain no change in bowel or urinary habits. Otherwise negative.    Objective:   Physical Exam Alert no acute distress talkative. HEENT normal. Lungs clear. Heart regular in rhythm. Neuro exam intact.       Assessment & Plan:  Impression 1 hypertension good control. #2 hyperlipidemia status uncertain. #3 impaired fasting glucose status uncertain. #4 stroke. No new symptoms. Plan maintain same meds. Including aspirin. Diet exercise discussed in encourage. Press on with blood work to evaluate status of other health issues. WSL

## 2012-09-28 DIAGNOSIS — I10 Essential (primary) hypertension: Secondary | ICD-10-CM | POA: Insufficient documentation

## 2012-09-28 DIAGNOSIS — E785 Hyperlipidemia, unspecified: Secondary | ICD-10-CM | POA: Insufficient documentation

## 2012-09-28 DIAGNOSIS — R7301 Impaired fasting glucose: Secondary | ICD-10-CM | POA: Insufficient documentation

## 2012-09-28 DIAGNOSIS — I639 Cerebral infarction, unspecified: Secondary | ICD-10-CM | POA: Insufficient documentation

## 2012-09-29 LAB — BASIC METABOLIC PANEL
Chloride: 102 mEq/L (ref 96–112)
Creat: 1.26 mg/dL (ref 0.50–1.35)
Potassium: 4.9 mEq/L (ref 3.5–5.3)
Sodium: 139 mEq/L (ref 135–145)

## 2012-09-29 LAB — HEPATIC FUNCTION PANEL
Albumin: 4.1 g/dL (ref 3.5–5.2)
Alkaline Phosphatase: 51 U/L (ref 39–117)
Total Bilirubin: 0.4 mg/dL (ref 0.3–1.2)
Total Protein: 6.8 g/dL (ref 6.0–8.3)

## 2012-09-29 LAB — LIPID PANEL
HDL: 50 mg/dL (ref >39)
LDL Cholesterol: 144 mg/dL — ABNORMAL HIGH (ref 0–99)
Total CHOL/HDL Ratio: 4.3 Ratio
Triglycerides: 107 mg/dL (ref <150)
VLDL: 21 mg/dL (ref 0–40)

## 2012-10-13 ENCOUNTER — Ambulatory Visit: Payer: Medicare Other | Admitting: Family Medicine

## 2012-10-14 ENCOUNTER — Ambulatory Visit (INDEPENDENT_AMBULATORY_CARE_PROVIDER_SITE_OTHER): Payer: Medicare Other | Admitting: Family Medicine

## 2012-10-14 ENCOUNTER — Encounter: Payer: Self-pay | Admitting: Family Medicine

## 2012-10-14 VITALS — BP 170/80 | HR 80 | Wt 177.0 lb

## 2012-10-14 DIAGNOSIS — R7301 Impaired fasting glucose: Secondary | ICD-10-CM

## 2012-10-14 DIAGNOSIS — E119 Type 2 diabetes mellitus without complications: Secondary | ICD-10-CM

## 2012-10-14 NOTE — Progress Notes (Signed)
  Subjective:    Patient ID: Mark Brady, male    DOB: 11-11-1929, 79 y.o.   MRN: [id number]  Diabetes He presents for his initial diabetic visit. He has type 2 diabetes mellitus. His disease course has been stable. Symptoms are improving. Current diabetic treatment includes diet. His home blood glucose trend is increasing steadily. His breakfast blood glucose is taken between 7-8 am. His breakfast blood glucose range is generally 130-140 mg/dl.    Results for orders placed in visit on 10/14/12  POCT GLYCOSYLATED HEMOGLOBIN (HGB A1C)      Result Value Range   Hemoglobin A1C 7.1       Review of Systems Some increased urination really. No chest pain no headache no weight loss no nocturia no abdominal pain    Objective:   Physical Exam  Alert no acute distress. HEENT normal. Lungs clear. Heart regular in rhythm. Ankles pulses good. Sensation intact. No significant edema.      Assessment & Plan:  Impression #1 type 2 diabetes new onset discussed at length. Plan diet exercise discussed. Check a couple fasting sugars per week. Educational session encourage. Glucometer prescribed. Recheck in several months. Many questions answered. WSL

## 2012-10-14 NOTE — Patient Instructions (Addendum)
Need to see an eye doctor every year, and let them know you have diabetes  We will prescribe a monitor for you to check a couple sugars per week.  You need to go to educational sessio at the hospital to learn more about your condition.  Please read the educational info given you today.  Your HgbA1C is 7.1%.  Long term goal for A1C is between 6 and 7.0     Ideal range for fasting numbers in the morning are between 70 and 130.Type 2 Diabetes Mellitus, Adult Type 2 diabetes mellitus, often simply referred to as type 2 diabetes, is a long-lasting (chronic) disease. In type 2 diabetes, the pancreas does not make enough insulin (a hormone), the cells are less responsive to the insulin that is made (insulin resistance), or both. Normally, insulin moves sugars from food into the tissue cells. The tissue cells use the sugars for energy. The lack of insulin or the lack of normal response to insulin causes excess sugars to build up in the blood instead of going into the tissue cells. As a result, high blood sugar (hyperglycemia) develops. The effect of high sugar (glucose) levels can cause many complications. Type 2 diabetes was also previously called adult-onset diabetes but it can occur at any age.  RISK FACTORS  A person is predisposed to developing type 2 diabetes if someone in the family has the disease and also has one or more of the following primary risk factors:  Overweight.  An inactive lifestyle.  A history of consistently eating high-calorie foods. Maintaining a normal weight and regular physical activity can reduce the chance of developing type 2 diabetes. SYMPTOMS  A person with type 2 diabetes may not show symptoms initially. The symptoms of type 2 diabetes appear slowly. The symptoms include:  Increased thirst (polydipsia).  Increased urination (polyuria).  Increased urination during the night (nocturia).  Weight loss. This weight loss may be rapid.  Frequent, recurring  infections.  Tiredness (fatigue).  Weakness.  Vision changes, such as blurred vision.  Fruity smell to your breath.  Abdominal pain.  Nausea or vomiting.  Cuts or bruises which are slow to heal.  Tingling or numbness in the hands or feet. DIAGNOSIS Type 2 diabetes is frequently not diagnosed until complications of diabetes are present. Type 2 diabetes is diagnosed when symptoms or complications are present and when blood glucose levels are increased. Your blood glucose level may be checked by one or more of the following blood tests:  A fasting blood glucose test. You will not be allowed to eat for at least 8 hours before a blood sample is taken.  A random blood glucose test. Your blood glucose is checked at any time of the day regardless of when you ate.  A hemoglobin A1c blood glucose test. A hemoglobin A1c test provides information about blood glucose control over the previous 3 months.  An oral glucose tolerance test (OGTT). Your blood glucose is measured after you have not eaten (fasted) for 2 hours and then after you drink a glucose-containing beverage. TREATMENT   You may need to take insulin or diabetes medicine daily to keep blood glucose levels in the desired range.  You will need to match insulin dosing with exercise and healthy food choices. The treatment goal is to maintain the before meal blood sugar (preprandial glucose) level at 70 130 mg/dL. HOME CARE INSTRUCTIONS   Have your hemoglobin A1c level checked twice a year.  Perform daily blood glucose monitoring as directed  by your caregiver.  Monitor urine ketones when you are ill and as directed by your caregiver.  Take your diabetes medicine or insulin as directed by your caregiver to maintain your blood glucose levels in the desired range.  Never run out of diabetes medicine or insulin. It is needed every day.  Adjust insulin based on your intake of carbohydrates. Carbohydrates can raise blood glucose  levels but need to be included in your diet. Carbohydrates provide vitamins, minerals, and fiber which are an essential part of a healthy diet. Carbohydrates are found in fruits, vegetables, whole grains, dairy products, legumes, and foods containing added sugars.    Eat healthy foods. Alternate 3 meals with 3 snacks.  Lose weight if overweight.  Carry a medical alert card or wear your medical alert jewelry.  Carry a 15 gram carbohydrate snack with you at all times to treat low blood glucose (hypoglycemia). Some examples of 15 gram carbohydrate snacks include:  Glucose tablets, 3 or 4   Glucose gel, 15 gram tube  Raisins, 2 tablespoons (24 grams)  Jelly beans, 6  Animal crackers, 8  Regular pop, 4 ounces (120 mL)  Gummy treats, 9  Recognize hypoglycemia. Hypoglycemia occurs with blood glucose levels of 70 mg/dL and below. The risk for hypoglycemia increases when fasting or skipping meals, during or after intense exercise, and during sleep. Hypoglycemia symptoms can include:  Tremors or shakes.  Decreased ability to concentrate.  Sweating.  Increased heart rate.  Headache.  Dry mouth.  Hunger.  Irritability.  Anxiety.  Restless sleep.  Altered speech or coordination.  Confusion.  Treat hypoglycemia promptly. If you are alert and able to safely swallow, follow the 15:15 rule:  Take 15 20 grams of rapid-acting glucose or carbohydrate. Rapid-acting options include glucose gel, glucose tablets, or 4 ounces (120 mL) of fruit juice, regular soda, or low fat milk.  Check your blood glucose level 15 minutes after taking the glucose.  Take 15 20 grams more of glucose if the repeat blood glucose level is still 70 mg/dL or below.  Eat a meal or snack within 1 hour once blood glucose levels return to normal.    Be alert to polyuria and polydipsia which are early signs of hyperglycemia. An early awareness of hyperglycemia allows for prompt treatment. Treat  hyperglycemia as directed by your caregiver.  Engage in at least 150 minutes of moderate-intensity physical activity a week, spread over at least 3 days of the week or as directed by your caregiver. In addition, you should engage in resistance exercise at least 2 times a week or as directed by your caregiver.  Adjust your medicine and food intake as needed if you start a new exercise or sport.  Follow your sick day plan at any time you are unable to eat or drink as usual.  Avoid tobacco use.  Limit alcohol intake to no more than 1 drink per day for nonpregnant women and 2 drinks per day for men. You should drink alcohol only when you are also eating food. Talk with your caregiver whether alcohol is safe for you. Tell your caregiver if you drink alcohol several times a week.  Follow up with your caregiver regularly.  Schedule an eye exam soon after the diagnosis of type 2 diabetes and then annually.  Perform daily skin and foot care. Examine your skin and feet daily for cuts, bruises, redness, nail problems, bleeding, blisters, or sores. A foot exam by a caregiver should be done annually.  Brush  your teeth and gums at least twice a day and floss at least once a day. Follow up with your dentist regularly.  Share your diabetes management plan with your workplace or school.  Stay up-to-date with immunizations.  Learn to manage stress.  Obtain ongoing diabetes education and support as needed.  Participate in, or seek rehabilitation as needed to maintain or improve independence and quality of life. Request a physical or occupational therapy referral if you are having foot or hand numbness or difficulties with grooming, dressing, eating, or physical activity. SEEK MEDICAL CARE IF:   You are unable to eat food or drink fluids for more than 6 hours.  You have nausea and vomiting for more than 6 hours.  Your blood glucose level is over 240 mg/dL.  There is a change in mental  status.  You develop an additional serious illness.  You have diarrhea for more than 6 hours.  You have been sick or have had a fever for a couple of days and are not getting better.  You have pain during any physical activity.  SEEK IMMEDIATE MEDICAL CARE IF:  You have difficulty breathing.  You have moderate to large ketone levels. MAKE SURE YOU:  Understand these instructions.  Will watch your condition.  Will get help right away if you are not doing well or get worse. Document Released: 04/02/2005 Document Revised: 12/26/2011 Document Reviewed: 10/30/2011 [facility] Patient Information 2014 [place], [place].

## 2012-10-16 ENCOUNTER — Telehealth (HOSPITAL_COMMUNITY): Payer: Self-pay | Admitting: Dietician

## 2012-10-16 NOTE — Telephone Encounter (Signed)
Received voicemail from pt wife at [phone]. Pt and wife are desiring to attend group DM class on 10/28/12. Requested a call back to confirm.

## 2012-10-16 NOTE — Telephone Encounter (Signed)
Called back at [phone] and left message on pt voicemail. Confirmed registration for 10/28/12 group DM class.

## 2012-10-19 DIAGNOSIS — E119 Type 2 diabetes mellitus without complications: Secondary | ICD-10-CM | POA: Insufficient documentation

## 2012-10-19 DIAGNOSIS — E1169 Type 2 diabetes mellitus with other specified complication: Secondary | ICD-10-CM | POA: Insufficient documentation

## 2012-10-28 ENCOUNTER — Encounter (HOSPITAL_COMMUNITY): Payer: Self-pay | Admitting: Dietician

## 2012-10-28 NOTE — Progress Notes (Signed)
[facility] Diabetes Class Completion  Date:October 28, 2012  Time: 1000  Pt attended [facility]'s Diabetes Group Education Class on October 28, 2012.   Patient was educated on the following topics:   -Survival skills (signs and symptoms of hyperglycemia and hypoglycemia, treatment for hypoglycemia, ideal levels for fasting and postprandial blood sugars, goal Hgb A1c level, foot care basics)  -Recommendations for physical activity   -Carbohydrate metabolism in relation to diabetes   -Meal planning (sources of carbohydrate, carbohydrate counting, meal planning strategies, food label reading, and portion control).  Handouts provided:  -"Diabetes and You: Taking Charge of Your Health"  -"Carbohydrate Counting and Meal Planning"  -"Blood Sugar Diary"  -"Your Guide to Better Office Visits"   Hazelyn Kallen A. Kayan, RD, LDN   

## 2013-01-14 ENCOUNTER — Ambulatory Visit (INDEPENDENT_AMBULATORY_CARE_PROVIDER_SITE_OTHER): Payer: Medicare Other | Admitting: Family Medicine

## 2013-01-14 ENCOUNTER — Encounter: Payer: Self-pay | Admitting: Family Medicine

## 2013-01-14 VITALS — BP 152/78 | Ht 67.0 in | Wt 167.0 lb

## 2013-01-14 DIAGNOSIS — E785 Hyperlipidemia, unspecified: Secondary | ICD-10-CM

## 2013-01-14 DIAGNOSIS — I639 Cerebral infarction, unspecified: Secondary | ICD-10-CM

## 2013-01-14 DIAGNOSIS — I6350 Cerebral infarction due to unspecified occlusion or stenosis of unspecified cerebral artery: Secondary | ICD-10-CM

## 2013-01-14 DIAGNOSIS — E119 Type 2 diabetes mellitus without complications: Secondary | ICD-10-CM

## 2013-01-14 LAB — POCT GLYCOSYLATED HEMOGLOBIN (HGB A1C): Hemoglobin A1C: 6.1

## 2013-01-14 NOTE — Progress Notes (Signed)
  Subjective:    Patient ID: Mark Brady, male    DOB: Nov 12, 1929, 79 y.o.   MRN: [id number]  Diabetes He presents for his follow-up diabetic visit. He has type 2 diabetes mellitus. His disease course has been improving. Pertinent negatives for hypoglycemia include no confusion or dizziness. Pertinent negatives for diabetes include no blurred vision, no chest pain and no fatigue. Pertinent negatives for hypoglycemia complications include no hospitalization. Symptoms are improving. Pertinent negatives for diabetic complications include no autonomic neuropathy. Risk factors for coronary artery disease include diabetes mellitus, hypertension and male sex. He is following a diabetic diet. Meal planning includes avoidance of concentrated sweets. He participates in exercise three times a week. His breakfast blood glucose is taken between 7-8 am. His lunch blood glucose range is generally 90-110 mg/dl.    Here for a diabetic check up.   No concerns.   A1c 6.1%  Patient claims compliance with his blood pressure medications. Generally when he checks them numbers elsewhere good control. No obvious side effects from the medicine. Trying to watch his salt intake.  Trying to cut down fats in his diet. Realizes his LDL was too high when last checked.  No further history of stroke or TIA  Review of Systems  Constitutional: Negative for fatigue.  Eyes: Negative for blurred vision.  Cardiovascular: Negative for chest pain.  Neurological: Negative for dizziness.  Psychiatric/Behavioral: Negative for confusion.   Otherwise negative    Objective:   Physical Exam  Alert no acute distress. HEENT normal. Lungs clear. Heart regular rate and rhythm.      Assessment & Plan:  Impression #1 type 2 diabetes good control. A1c 6.1%. #2 hypertension good control. #3 hyperlipidemia and discuss. #4 history of stroke clinically stable. Plan patient declines flu shot. Diet exercise discussed. Maintain same  medications. Recheck in 6 months. WSL

## 2013-02-25 ENCOUNTER — Telehealth: Payer: Self-pay | Admitting: Family Medicine

## 2013-02-25 NOTE — Telephone Encounter (Signed)
Rx written and left up front for patient pick up. Patient  Notified.

## 2013-02-25 NOTE — Telephone Encounter (Signed)
Patient had to switch pharmacy due to closing and need new prescriptions called into [facility] [place]- test strips and lantis.

## 2013-05-28 ENCOUNTER — Telehealth: Payer: Self-pay | Admitting: Family Medicine

## 2013-05-28 MED ORDER — TRIAMTERENE-HCTZ 37.5-25 MG PO CAPS: 1.0000 | ORAL_CAPSULE | Freq: Every day | ORAL | Status: DC

## 2013-05-28 NOTE — Telephone Encounter (Signed)
Notified wife that medication was sent in through Epic and script for testing strips was faxed over to pharmacy.

## 2013-05-28 NOTE — Addendum Note (Signed)
Addended by: Jesusita Oka on: 05/28/2013 10:13 AM   Modules accepted: Orders

## 2013-05-28 NOTE — Telephone Encounter (Signed)
Patient needs refill on HCTZ-37.525mg  and diabetic strips. He has appointment march 1 for his 39mth check. Call into [facility]

## 2013-05-29 ENCOUNTER — Telehealth: Payer: Self-pay | Admitting: Family Medicine

## 2013-05-29 ENCOUNTER — Other Ambulatory Visit: Payer: Self-pay | Admitting: Family Medicine

## 2013-05-29 NOTE — Telephone Encounter (Signed)
Patient says that pharmacy did not receive triamterene-hydrochlorothiazide (DYAZIDE) or his test strips yesterday. Please advise.   [facility] 

## 2013-05-29 NOTE — Telephone Encounter (Signed)
Notified patient that pharmacy says they do have the prescription.

## 2013-06-01 ENCOUNTER — Telehealth: Payer: Self-pay | Admitting: Family Medicine

## 2013-06-01 MED ORDER — TRIAMTERENE-HCTZ 37.5-25 MG PO CAPS: 1.0000 | ORAL_CAPSULE | Freq: Every day | ORAL | Status: DC

## 2013-06-01 NOTE — Telephone Encounter (Signed)
triamterene-hydrochlorothiazide (DYAZIDE) 37.5-25 MG per capsule  Please give patient a hand written or hard copy of this script so they can  Go find a new pharmacy to use.  Mark Brady still hasn't filled this from a week  Ago. Patients are going to switch pharmacies

## 2013-06-01 NOTE — Telephone Encounter (Signed)
Patient notified

## 2013-06-01 NOTE — Telephone Encounter (Signed)
Pt is out of this med as of now due to the delay in the refill   Try to do today if possible

## 2013-06-15 ENCOUNTER — Encounter: Payer: Self-pay | Admitting: Family Medicine

## 2013-06-15 ENCOUNTER — Ambulatory Visit (INDEPENDENT_AMBULATORY_CARE_PROVIDER_SITE_OTHER): Payer: Medicare Other | Admitting: Family Medicine

## 2013-06-15 VITALS — BP 130/80 | Ht 67.0 in | Wt 168.0 lb

## 2013-06-15 DIAGNOSIS — I6350 Cerebral infarction due to unspecified occlusion or stenosis of unspecified cerebral artery: Secondary | ICD-10-CM

## 2013-06-15 DIAGNOSIS — E119 Type 2 diabetes mellitus without complications: Secondary | ICD-10-CM

## 2013-06-15 DIAGNOSIS — R7301 Impaired fasting glucose: Secondary | ICD-10-CM

## 2013-06-15 DIAGNOSIS — E785 Hyperlipidemia, unspecified: Secondary | ICD-10-CM

## 2013-06-15 DIAGNOSIS — I639 Cerebral infarction, unspecified: Secondary | ICD-10-CM

## 2013-06-15 DIAGNOSIS — I10 Essential (primary) hypertension: Secondary | ICD-10-CM

## 2013-06-15 LAB — POCT GLYCOSYLATED HEMOGLOBIN (HGB A1C): Hemoglobin A1C: 6

## 2013-06-15 NOTE — Progress Notes (Signed)
   Subjective:    Patient ID: Mark Brady, male    DOB: 22-May-1929, 80 y.o.   MRN: [id number]  Diabetes He presents for his follow-up diabetic visit. He has type 2 diabetes mellitus. His disease course has been stable. There are no hypoglycemic associated symptoms. There are no diabetic associated symptoms. There are no hypoglycemic complications. Symptoms are stable. There are no diabetic complications. There are no known risk factors for coronary artery disease. When asked about current treatments, none were reported. He is compliant with treatment all of the time.  Patient states that he has no concerns at this time. A1C today is 6.0.   Results for orders placed in visit on 06/15/13  POCT GLYCOSYLATED HEMOGLOBIN (HGB A1C)      Result Value Ref Range   Hemoglobin A1C 6.0     numbersare good fasting less than 120, using splenda for tea  Walking some in the house, watching fdiet closely  BP good, taking the med faithfully  No salt in food No further symptoms of stroke or TIA  Generally blood pressure good when checked elsewhere.  Saw eye doctor one year ago. Review of Systems No back pain no chest pain no headache no weight loss no weight gain decent appetite no blood in stools no rash ROS otherwise negative    Objective:   Physical Exam  Alert no apparent distress. Vitals reviewed. HEENT normal. Lungs clear. Heart regular in rhythm. Ankles without edema.  Feet pulses good bilateral good sensation no edema    Assessment & Plan:  Impression 1 type 2 diabetes control good. #2 hypertension good control. #3 hyperlipidemia subo disinclined to start medicine. #4 history of stroke likely stable plan diet exercise discussed. Check in several months for a wellness exam. Check in 6 months for diabetes visit. WSLptimum discuss patient  No

## 2013-08-27 ENCOUNTER — Other Ambulatory Visit: Payer: Self-pay | Admitting: Family Medicine

## 2013-09-17 ENCOUNTER — Telehealth: Payer: Self-pay | Admitting: Family Medicine

## 2013-09-17 DIAGNOSIS — I10 Essential (primary) hypertension: Secondary | ICD-10-CM

## 2013-09-17 DIAGNOSIS — Z79899 Other long term (current) drug therapy: Secondary | ICD-10-CM

## 2013-09-17 DIAGNOSIS — E119 Type 2 diabetes mellitus without complications: Secondary | ICD-10-CM

## 2013-09-17 NOTE — Telephone Encounter (Signed)
Lipid, Liver 6/14 and patient had A1c 3/15

## 2013-09-17 NOTE — Telephone Encounter (Signed)
Patient has physical on 6/17 and needing lab papers so he can get it done before appointment.

## 2013-09-17 NOTE — Telephone Encounter (Signed)
Lip liv m7 A1c  

## 2013-09-18 NOTE — Addendum Note (Signed)
Addended by: Jesusita Oka on: 09/18/2013 11:09 AM   Modules accepted: Orders

## 2013-09-18 NOTE — Telephone Encounter (Signed)
Left message on voicemail notifying patient bloodwork has been ordered.  

## 2013-09-21 LAB — HEMOGLOBIN A1C
HEMOGLOBIN A1C: 6.3 % — AB (ref <5.7)
MEAN PLASMA GLUCOSE: 134 mg/dL — AB (ref <117)

## 2013-09-21 LAB — HEPATIC FUNCTION PANEL
ALT: 13 U/L (ref 0–53)
AST: 11 U/L (ref 0–37)
Albumin: 4 g/dL (ref 3.5–5.2)
Alkaline Phosphatase: 48 U/L (ref 39–117)
Bilirubin, Direct: 0.1 mg/dL (ref 0.0–0.3)
Indirect Bilirubin: 0.4 mg/dL (ref 0.2–1.2)
Total Bilirubin: 0.5 mg/dL (ref 0.2–1.2)
Total Protein: 6.4 g/dL (ref 6.0–8.3)

## 2013-09-21 LAB — BASIC METABOLIC PANEL
BUN: 21 mg/dL (ref 6–23)
CHLORIDE: 102 meq/L (ref 96–112)
CO2: 27 mEq/L (ref 19–32)
Calcium: 9.2 mg/dL (ref 8.4–10.5)
Creat: 1.15 mg/dL (ref 0.50–1.35)
GLUCOSE: 124 mg/dL — AB (ref 70–99)
Potassium: 4.6 mEq/L (ref 3.5–5.3)
SODIUM: 139 meq/L (ref 135–145)

## 2013-09-21 LAB — LIPID PANEL
CHOL/HDL RATIO: 3.7 ratio
CHOLESTEROL: 226 mg/dL — AB (ref 0–200)
HDL: 61 mg/dL (ref >39)
LDL Cholesterol: 142 mg/dL — ABNORMAL HIGH (ref 0–99)
Triglycerides: 116 mg/dL (ref <150)
VLDL: 23 mg/dL (ref 0–40)

## 2013-09-30 ENCOUNTER — Encounter: Payer: Self-pay | Admitting: Family Medicine

## 2013-09-30 ENCOUNTER — Ambulatory Visit (INDEPENDENT_AMBULATORY_CARE_PROVIDER_SITE_OTHER): Payer: Medicare Other | Admitting: Family Medicine

## 2013-09-30 VITALS — BP 158/80 | Ht 67.0 in | Wt 168.0 lb

## 2013-09-30 DIAGNOSIS — Z0000 Encounter for general adult medical examination without abnormal findings: Secondary | ICD-10-CM

## 2013-09-30 NOTE — Progress Notes (Signed)
Subjective:    Patient ID: Mark Brady, male    DOB: [birth year], 79 y.o.   MRN: [id number]  HPI AWV- Annual Wellness Visit  The patient was seen for their annual wellness visit. The patient's past medical history, surgical history, and family history were reviewed. Pertinent vaccines were reviewed ( tetanus, pneumonia, shingles, flu) The patient's medication list was reviewed and updated.  The height and weight were entered. The patient's current BMI is: 26  Cognitive screening was completed. Outcome of Mini - Cog: Remember 3 words out of 3  Falls within the past 6 months:NO  Current tobacco usage: NO (All patients who use tobacco were given written and verbal information on quitting)  Recent listing of emergency department/hospitalizations over the past year were reviewed.  current specialist the patient sees on a regular basis: Needs a new doctor for his prostate issue.    Medicare annual wellness visit patient questionnaire was reviewed.  A written screening schedule for the patient for the next 5-10 years was given. Appropriate discussion of followup regarding next visit was discussed.   Results for orders placed in visit on 09/17/13  LIPID PANEL      Result Value Ref Range   Cholesterol 226 (*) 0 - 200 mg/dL   Triglycerides 116  <150 mg/dL   HDL 61  >39 mg/dL   Total CHOL/HDL Ratio 3.7     VLDL 23  0 - 40 mg/dL   LDL Cholesterol 142 (*) 0 - 99 mg/dL  HEPATIC FUNCTION PANEL      Result Value Ref Range   Total Bilirubin 0.5  0.2 - 1.2 mg/dL   Bilirubin, Direct 0.1  0.0 - 0.3 mg/dL   Indirect Bilirubin 0.4  0.2 - 1.2 mg/dL   Alkaline Phosphatase 48  39 - 117 U/L   AST 11  0 - 37 U/L   ALT 13  0 - 53 U/L   Total Protein 6.4  6.0 - 8.3 g/dL   Albumin 4.0  3.5 - 5.2 g/dL  BASIC METABOLIC PANEL      Result Value Ref Range   Sodium 139  135 - 145 mEq/L   Potassium 4.6  3.5 - 5.3 mEq/L   Chloride 102  96 - 112 mEq/L   CO2 27  19 - 32 mEq/L   Glucose, Bld 124  (*) 70 - 99 mg/dL   BUN 21  6 - 23 mg/dL   Creat 1.15  0.50 - 1.35 mg/dL   Calcium 9.2  8.4 - 10.5 mg/dL  HEMOGLOBIN A1C      Result Value Ref Range   Hemoglobin A1C 6.3 (*) <5.7 %   Mean Plasma Glucose 134 (*) <117 mg/dL   Patient claims compliance with diabetes medication. Watching sugar in his diet.     Review of Systems  Constitutional: Negative for fever, activity change and appetite change.  HENT: Negative for congestion and rhinorrhea.   Eyes: Negative for discharge.  Respiratory: Negative for cough and wheezing.   Cardiovascular: Negative for chest pain.  Gastrointestinal: Negative for vomiting, abdominal pain and blood in stool.  Genitourinary: Negative for frequency and difficulty urinating.  Musculoskeletal: Negative for neck pain.  Skin: Negative for rash.  Allergic/Immunologic: Negative for environmental allergies and food allergies.  Neurological: Negative for weakness and headaches.  Psychiatric/Behavioral: Negative for agitation.  All other systems reviewed and are negative.      Objective:   Physical Exam  Vitals reviewed. Constitutional: He appears well-developed and well-nourished.  HENT:  Head: Normocephalic and atraumatic.  Right Ear: External ear normal.  Left Ear: External ear normal.  Nose: Nose normal.  Mouth/Throat: Oropharynx is clear and moist.  Eyes: EOM are normal. Pupils are equal, round, and reactive to light.  Neck: Normal range of motion. Neck supple. No thyromegaly present.  Cardiovascular: Normal rate, regular rhythm and normal heart sounds.   No murmur heard. Pulmonary/Chest: Effort normal and breath sounds normal. No respiratory distress. He has no wheezes.  Abdominal: Soft. Bowel sounds are normal. He exhibits no distension and no mass. There is no tenderness.  Genitourinary: Penis normal.  Musculoskeletal: Normal range of motion. He exhibits no edema.  Lymphadenopathy:    He has no cervical adenopathy.  Neurological: He is  alert. He exhibits normal muscle tone.  Skin: Skin is warm and dry. No erythema.  Psychiatric: He has a normal mood and affect. His behavior is normal. Judgment normal.          Assessment & Plan:  Impression #1 wellness exam. #2 status post stroke clinically stable no neurological difficulties at this time. #3 #3 type 2 diabetes clinically stable. #4 hyperlipidemia discussed plan diet exercise discussed. Medications refilled. Followup as scheduled. WSL

## 2013-11-10 ENCOUNTER — Other Ambulatory Visit: Payer: Self-pay | Admitting: Family Medicine

## 2013-11-25 ENCOUNTER — Other Ambulatory Visit: Payer: Self-pay | Admitting: Family Medicine

## 2013-12-22 ENCOUNTER — Encounter: Payer: Self-pay | Admitting: Family Medicine

## 2014-01-12 LAB — HM DIABETES EYE EXAM

## 2014-01-13 ENCOUNTER — Encounter (HOSPITAL_COMMUNITY): Payer: Self-pay | Admitting: Pharmacy Technician

## 2014-01-14 ENCOUNTER — Telehealth: Payer: Self-pay | Admitting: Family Medicine

## 2014-01-14 DIAGNOSIS — Z79899 Other long term (current) drug therapy: Secondary | ICD-10-CM

## 2014-01-14 DIAGNOSIS — E785 Hyperlipidemia, unspecified: Secondary | ICD-10-CM

## 2014-01-14 DIAGNOSIS — E119 Type 2 diabetes mellitus without complications: Secondary | ICD-10-CM

## 2014-01-14 NOTE — Telephone Encounter (Signed)
Patient has appt. On 10/16 and want lab work done before appt.

## 2014-01-14 NOTE — Telephone Encounter (Signed)
Last bw 09/21/13 lipid, liver, bmp, a1c

## 2014-01-15 NOTE — Telephone Encounter (Signed)
Lip liv A1c 

## 2014-01-15 NOTE — Addendum Note (Signed)
Addended by: Jesusita Oka on: 01/15/2014 10:32 AM   Modules accepted: Orders

## 2014-01-15 NOTE — Telephone Encounter (Signed)
Left message on voicemail notifying patient that blood work has been ordered.  

## 2014-01-20 NOTE — Patient Instructions (Signed)
Mark Brady  01/20/2014   Your procedure is scheduled on:  01/26/2014  Report to [facility] at 6:30 AM.  Call this number if you have problems the morning of surgery: [phone]   Remember:   Do not eat food or drink liquids after midnight.   Take these medicines the morning of surgery with A SIP OF WATER: Dyazide   Do not wear jewelry, make-up or nail polish.  Do not wear lotions, powders, or perfumes. You may wear deodorant.  Do not shave 48 hours prior to surgery. Men may shave face and neck.  Do not bring valuables to the hospital.  [facility] is not responsible for any belongings or  valuables.               Contacts, dentures or bridgework may not be worn into surgery.  Leave suitcase in the car. After surgery it may be brought to your room.  For patients admitted to the hospital, discharge time is determined by your treatment team.               Patients discharged the day of surgery will not be allowed to drive home.  Name and phone number of your driver:   Special Instructions: N/A   Please read over the following fact sheets that you were given: Anesthesia Post-op Instructions   PATIENT INSTRUCTIONS POST-ANESTHESIA  IMMEDIATELY FOLLOWING SURGERY:  Do not drive or operate machinery for the first twenty four hours after surgery.  Do not make any important decisions for twenty four hours after surgery or while taking narcotic pain medications or sedatives.  If you develop intractable nausea and vomiting or a severe headache please notify your doctor immediately.  FOLLOW-UP:  Please make an appointment with your surgeon as instructed. You do not need to follow up with anesthesia unless specifically instructed to do so.  WOUND CARE INSTRUCTIONS (if applicable):  Keep a dry clean dressing on the anesthesia/puncture wound site if there is drainage.  Once the wound has quit draining you may leave it open to air.  Generally you should leave the bandage intact for twenty  four hours unless there is drainage.  If the epidural site drains for more than 36-48 hours please call the anesthesia department.  QUESTIONS?:  Please feel free to call your physician or the hospital operator if you have any questions, and they will be happy to assist you.      Cataract A cataract is a clouding of the lens of the eye. When a lens becomes cloudy, vision is reduced based on the degree and nature of the clouding. Many cataracts reduce vision to some degree. Some cataracts make people more near-sighted as they develop. Other cataracts increase glare. Cataracts that are ignored and become worse can sometimes look white. The white color can be seen through the pupil. CAUSES   Aging. However, cataracts may occur at any age, even in newborns.  Certain drugs.  Trauma to the eye.  Certain diseases such as diabetes.  Specific eye diseases such as chronic inflammation inside the eye or a sudden attack of a rare form of glaucoma.  Inherited or acquired medical problems. SYMPTOMS   Gradual, progressive drop in vision in the affected eye.  Severe, rapid visual loss. This most often happens when trauma is the cause. DIAGNOSIS  To detect a cataract, an eye doctor examines the lens. Cataracts are best diagnosed with an exam of the eyes with the pupils enlarged (dilated) by drops.  TREATMENT  For an early cataract, vision may improve by using different eyeglasses or stronger lighting. If that does not help your vision, surgery is the only effective treatment. A cataract needs to be surgically removed when vision loss interferes with your everyday activities, such as driving, reading, or watching TV. A cataract may also have to be removed if it prevents examination or treatment of another eye problem. Surgery removes the cloudy lens and usually replaces it with a substitute lens (intraocular lens, IOL).  At a time when both you and your doctor agree, the cataract will be surgically  removed. If you have cataracts in both eyes, only one is usually removed at a time. This allows the operated eye to heal and be out of danger from any possible problems after surgery (such as infection or poor wound healing). In rare cases, a cataract may be doing damage to your eye. In these cases, your caregiver may advise surgical removal right away. The vast majority of people who have cataract surgery have better vision afterward. HOME CARE INSTRUCTIONS  If you are not planning surgery, you may be asked to do the following:  Use different eyeglasses.  Use stronger or brighter lighting.  Ask your eye doctor about reducing your medicine dose or changing medicines if it is thought that a medicine caused your cataract. Changing medicines does not make the cataract go away on its own.  Become familiar with your surroundings. Poor vision can lead to injury. Avoid bumping into things on the affected side. You are at a higher risk for tripping or falling.  Exercise extreme care when driving or operating machinery.  Wear sunglasses if you are sensitive to bright light or experiencing problems with glare. SEEK IMMEDIATE MEDICAL CARE IF:   You have a worsening or sudden vision loss.  You notice redness, swelling, or increasing pain in the eye.  You have a fever. Document Released: 04/02/2005 Document Revised: 06/25/2011 Document Reviewed: 11/24/2010 [facility] Patient Information 2015 [place], [place]. This information is not intended to replace advice given to you by your health care provider. Make sure you discuss any questions you have with your health care provider.

## 2014-01-21 ENCOUNTER — Encounter (HOSPITAL_COMMUNITY): Payer: Self-pay

## 2014-01-21 ENCOUNTER — Encounter (HOSPITAL_COMMUNITY)
Admission: RE | Admit: 2014-01-21 | Discharge: 2014-01-21 | Disposition: A | Discharge status: Home / Self Care | Payer: Medicare Other | Source: Ambulatory Visit | Attending: Ophthalmology | Admitting: Ophthalmology

## 2014-01-21 ENCOUNTER — Other Ambulatory Visit: Payer: Self-pay

## 2014-01-21 DIAGNOSIS — H268 Other specified cataract: Secondary | ICD-10-CM | POA: Insufficient documentation

## 2014-01-21 DIAGNOSIS — E785 Hyperlipidemia, unspecified: Secondary | ICD-10-CM | POA: Insufficient documentation

## 2014-01-21 DIAGNOSIS — Z0000 Encounter for general adult medical examination without abnormal findings: Secondary | ICD-10-CM | POA: Diagnosis present

## 2014-01-21 DIAGNOSIS — I10 Essential (primary) hypertension: Secondary | ICD-10-CM | POA: Insufficient documentation

## 2014-01-21 DIAGNOSIS — Z8673 Personal history of transient ischemic attack (TIA), and cerebral infarction without residual deficits: Secondary | ICD-10-CM | POA: Diagnosis not present

## 2014-01-21 DIAGNOSIS — E119 Type 2 diabetes mellitus without complications: Secondary | ICD-10-CM | POA: Insufficient documentation

## 2014-01-21 DIAGNOSIS — R079 Chest pain, unspecified: Secondary | ICD-10-CM | POA: Insufficient documentation

## 2014-01-21 HISTORY — DX: Type 2 diabetes mellitus without complications: E11.9

## 2014-01-21 LAB — BASIC METABOLIC PANEL
Anion gap: 15 (ref 5–15)
BUN: 22 mg/dL (ref 6–23)
CO2: 25 mEq/L (ref 19–32)
Calcium: 9.1 mg/dL (ref 8.4–10.5)
Chloride: 101 mEq/L (ref 96–112)
Creatinine, Ser: 1.16 mg/dL (ref 0.50–1.35)
GFR calc Af Amer: 66 mL/min — ABNORMAL LOW (ref >90)
GFR calc non Af Amer: 57 mL/min — ABNORMAL LOW (ref >90)
GLUCOSE: 167 mg/dL — AB (ref 70–99)
POTASSIUM: 3.9 meq/L (ref 3.7–5.3)
Sodium: 141 mEq/L (ref 137–147)

## 2014-01-21 LAB — HEPATIC FUNCTION PANEL
ALT: 14 U/L (ref 0–53)
AST: 15 U/L (ref 0–37)
Albumin: 4 g/dL (ref 3.5–5.2)
Alkaline Phosphatase: 47 U/L (ref 39–117)
BILIRUBIN DIRECT: 0.1 mg/dL (ref 0.0–0.3)
Indirect Bilirubin: 0.5 mg/dL (ref 0.2–1.2)
Total Bilirubin: 0.6 mg/dL (ref 0.2–1.2)
Total Protein: 6.5 g/dL (ref 6.0–8.3)

## 2014-01-21 LAB — CBC
HCT: 40.6 % (ref 39.0–52.0)
HEMOGLOBIN: 14.3 g/dL (ref 13.0–17.0)
MCH: 32.1 pg (ref 26.0–34.0)
MCHC: 35.2 g/dL (ref 30.0–36.0)
MCV: 91.2 fL (ref 78.0–100.0)
PLATELETS: 276 10*3/uL (ref 150–400)
RBC: 4.45 MIL/uL (ref 4.22–5.81)
RDW: 13 % (ref 11.5–15.5)
WBC: 8.9 10*3/uL (ref 4.0–10.5)

## 2014-01-21 LAB — DIFFERENTIAL
BASOS PCT: 0 % (ref 0–1)
Basophils Absolute: 0 10*3/uL (ref 0.0–0.1)
Eosinophils Absolute: 0.2 10*3/uL (ref 0.0–0.7)
Eosinophils Relative: 2 % (ref 0–5)
LYMPHS ABS: 2.3 10*3/uL (ref 0.7–4.0)
Lymphocytes Relative: 26 % (ref 12–46)
MONOS PCT: 7 % (ref 3–12)
Monocytes Absolute: 0.6 10*3/uL (ref 0.1–1.0)
NEUTROS ABS: 5.8 10*3/uL (ref 1.7–7.7)
Neutrophils Relative %: 65 % (ref 43–77)

## 2014-01-21 LAB — LIPID PANEL
CHOL/HDL RATIO: 3.5 ratio
CHOLESTEROL: 217 mg/dL — AB (ref 0–200)
HDL: 62 mg/dL (ref >39)
LDL Cholesterol: 136 mg/dL — ABNORMAL HIGH (ref 0–99)
Triglycerides: 97 mg/dL (ref <150)
VLDL: 19 mg/dL (ref 0–40)

## 2014-01-21 LAB — HEMOGLOBIN A1C
HEMOGLOBIN A1C: 6.4 % — AB (ref <5.7)
MEAN PLASMA GLUCOSE: 137 mg/dL — AB (ref <117)

## 2014-01-21 NOTE — Pre-Procedure Instructions (Signed)
Patient given onformation top sign up for my chart at home.

## 2014-01-25 MED ORDER — KETOROLAC TROMETHAMINE 0.5 % OP SOLN
OPHTHALMIC | Status: AC
  Filled 2014-01-25: qty 5

## 2014-01-25 MED ORDER — CYCLOPENTOLATE-PHENYLEPHRINE OP SOLN OPTIME - NO CHARGE
OPHTHALMIC | Status: AC
  Filled 2014-01-25: qty 2

## 2014-01-25 MED ORDER — TETRACAINE HCL 0.5 % OP SOLN: 1.0000 [drp] | OPHTHALMIC | Status: AC

## 2014-01-25 MED ORDER — TETRACAINE HCL 0.5 % OP SOLN
OPHTHALMIC | Status: AC
  Filled 2014-01-25: qty 2

## 2014-01-25 MED ORDER — PHENYLEPHRINE HCL 2.5 % OP SOLN
OPHTHALMIC | Status: AC
  Filled 2014-01-25: qty 15

## 2014-01-26 ENCOUNTER — Encounter (HOSPITAL_COMMUNITY)
Admission: RE | Disposition: A | Discharge status: Home / Self Care | Payer: Self-pay | Source: Ambulatory Visit | Attending: Ophthalmology

## 2014-01-26 ENCOUNTER — Encounter (HOSPITAL_COMMUNITY): Payer: Medicare Other | Admitting: Anesthesiology

## 2014-01-26 ENCOUNTER — Ambulatory Visit (HOSPITAL_COMMUNITY)
Admission: RE | Admit: 2014-01-26 | Discharge: 2014-01-26 | Disposition: A | Discharge status: Home / Self Care | Payer: Medicare Other | Source: Ambulatory Visit | Attending: Ophthalmology | Admitting: Ophthalmology

## 2014-01-26 ENCOUNTER — Ambulatory Visit (HOSPITAL_COMMUNITY): Payer: Medicare Other | Admitting: Anesthesiology

## 2014-01-26 ENCOUNTER — Encounter (HOSPITAL_COMMUNITY): Payer: Self-pay | Admitting: *Deleted

## 2014-01-26 DIAGNOSIS — Z8673 Personal history of transient ischemic attack (TIA), and cerebral infarction without residual deficits: Secondary | ICD-10-CM | POA: Insufficient documentation

## 2014-01-26 DIAGNOSIS — H2511 Age-related nuclear cataract, right eye: Secondary | ICD-10-CM | POA: Insufficient documentation

## 2014-01-26 DIAGNOSIS — I10 Essential (primary) hypertension: Secondary | ICD-10-CM | POA: Insufficient documentation

## 2014-01-26 DIAGNOSIS — E785 Hyperlipidemia, unspecified: Secondary | ICD-10-CM | POA: Insufficient documentation

## 2014-01-26 DIAGNOSIS — E119 Type 2 diabetes mellitus without complications: Secondary | ICD-10-CM | POA: Diagnosis not present

## 2014-01-26 HISTORY — PX: CATARACT EXTRACTION W/PHACO: SHX586

## 2014-01-26 SURGERY — PHACOEMULSIFICATION, CATARACT, WITH IOL INSERTION
Anesthesia: Monitor Anesthesia Care | Laterality: Right

## 2014-01-26 MED ORDER — BSS IO SOLN
INTRAOCULAR | Status: DC | PRN
  Administered 2014-01-26: 15 mL

## 2014-01-26 MED ORDER — FENTANYL CITRATE 0.05 MG/ML IJ SOLN
INTRAMUSCULAR | Status: AC
Start: 2014-01-26 — End: 2014-01-26
  Filled 2014-01-26: qty 2

## 2014-01-26 MED ORDER — POVIDONE-IODINE 5 % OP SOLN
OPHTHALMIC | Status: DC | PRN
  Administered 2014-01-26: 1 via OPHTHALMIC

## 2014-01-26 MED ORDER — LACTATED RINGERS IV SOLN
INTRAVENOUS | Status: DC
Start: 2014-01-26 — End: 2014-01-26
  Administered 2014-01-26: 08:00:00 via INTRAVENOUS

## 2014-01-26 MED ORDER — TETRACAINE HCL 0.5 % OP SOLN
1.0000 [drp] | OPHTHALMIC | Status: AC
  Administered 2014-01-26 (×3): 1 [drp] via OPHTHALMIC

## 2014-01-26 MED ORDER — FENTANYL CITRATE 0.05 MG/ML IJ SOLN
25.0000 ug | INTRAMUSCULAR | Status: AC
  Administered 2014-01-26: 25 ug via INTRAVENOUS

## 2014-01-26 MED ORDER — CYCLOPENTOLATE-PHENYLEPHRINE 0.2-1 % OP SOLN
1.0000 [drp] | OPHTHALMIC | Status: AC
  Administered 2014-01-26 (×3): 1 [drp] via OPHTHALMIC

## 2014-01-26 MED ORDER — PROVISC 10 MG/ML IO SOLN
INTRAOCULAR | Status: DC | PRN
  Administered 2014-01-26 (×2): 0.85 mL via INTRAOCULAR

## 2014-01-26 MED ORDER — MIDAZOLAM HCL 2 MG/2ML IJ SOLN
INTRAMUSCULAR | Status: AC
  Filled 2014-01-26: qty 2

## 2014-01-26 MED ORDER — EPINEPHRINE HCL 1 MG/ML IJ SOLN
INTRAMUSCULAR | Status: AC
  Filled 2014-01-26: qty 1

## 2014-01-26 MED ORDER — EPINEPHRINE HCL 1 MG/ML IJ SOLN
INTRAOCULAR | Status: DC | PRN
  Administered 2014-01-26: 08:00:00

## 2014-01-26 MED ORDER — PHENYLEPHRINE HCL 2.5 % OP SOLN
1.0000 [drp] | OPHTHALMIC | Status: AC
  Administered 2014-01-26 (×3): 1 [drp] via OPHTHALMIC

## 2014-01-26 MED ORDER — MIDAZOLAM HCL 2 MG/2ML IJ SOLN
1.0000 mg | INTRAMUSCULAR | Status: DC | PRN
  Administered 2014-01-26: 2 mg via INTRAVENOUS

## 2014-01-26 SURGICAL SUPPLY — 25 items
CAPSULAR TENSION RING-AMO (OPHTHALMIC RELATED) IMPLANT
CLOTH BEACON ORANGE TIMEOUT ST (SAFETY) ×2 IMPLANT
EYE SHIELD UNIVERSAL CLEAR (GAUZE/BANDAGES/DRESSINGS) ×2 IMPLANT
GLOVE BIO SURGEON STRL SZ 6.5 (GLOVE) IMPLANT
GLOVE BIO SURGEONS STRL SZ 6.5 (GLOVE)
GLOVE ECLIPSE 6.5 STRL STRAW (GLOVE) IMPLANT
GLOVE ECLIPSE 7.0 STRL STRAW (GLOVE) ×4 IMPLANT
GLOVE EXAM NITRILE LRG STRL (GLOVE) IMPLANT
GLOVE EXAM NITRILE MD LF STRL (GLOVE) IMPLANT
GLOVE SKINSENSE NS SZ6.5 (GLOVE)
GLOVE SKINSENSE STRL SZ6.5 (GLOVE) IMPLANT
HEALON 5 0.6 ML (INTRAOCULAR LENS) IMPLANT
KIT VITRECTOMY (OPHTHALMIC RELATED) IMPLANT
PAD ARMBOARD 7.5X6 YLW CONV (MISCELLANEOUS) ×2 IMPLANT
PROC W NO LENS (INTRAOCULAR LENS)
PROC W SPEC LENS (INTRAOCULAR LENS)
PROCESS W NO LENS (INTRAOCULAR LENS) IMPLANT
PROCESS W SPEC LENS (INTRAOCULAR LENS) IMPLANT
RETRACTOR IRIS SIGHTPATH (OPHTHALMIC RELATED) IMPLANT
RING MALYGIN (MISCELLANEOUS) IMPLANT
SIGHTPATH CAT PROC W REG LENS (Ophthalmic Related) ×3 IMPLANT
TAPE SURG TRANSPORE 1 IN (GAUZE/BANDAGES/DRESSINGS) IMPLANT
TAPE SURGICAL TRANSPORE 1 IN (GAUZE/BANDAGES/DRESSINGS) ×2
VISCOELASTIC ADDITIONAL (OPHTHALMIC RELATED) ×2 IMPLANT
WATER STERILE IRR 250ML POUR (IV SOLUTION) ×2 IMPLANT

## 2014-01-26 NOTE — Transfer of Care (Signed)
Immediate Anesthesia Transfer of Care Note  Patient: Mark Brady  Procedure(s) Performed: Procedure(s) (LRB): CATARACT EXTRACTION PHACO AND INTRAOCULAR LENS PLACEMENT (IOC) (Right)  Patient Location: Shortstay  Anesthesia Type: MAC  Level of Consciousness: awake  Airway & Oxygen Therapy: Patient Spontanous Breathing   Post-op Assessment: Report given to PACU RN, Post -op Vital signs reviewed and stable and Patient moving all extremities  Post vital signs: Reviewed and stable  Complications: No apparent anesthesia complications

## 2014-01-26 NOTE — Anesthesia Postprocedure Evaluation (Signed)
  Anesthesia Post-op Note  Patient: Mark Brady  Procedure(s) Performed: Procedure(s) (LRB): CATARACT EXTRACTION PHACO AND INTRAOCULAR LENS PLACEMENT (IOC) (Right)  Patient Location:  Short Stay  Anesthesia Type: MAC  Level of Consciousness: awake  Airway and Oxygen Therapy: Patient Spontanous Breathing  Post-op Pain: none  Post-op Assessment: Post-op Vital signs reviewed, Patient's Cardiovascular Status Stable, Respiratory Function Stable, Patent Airway, No signs of Nausea or vomiting and Pain level controlled  Post-op Vital Signs: Reviewed and stable  Complications: No apparent anesthesia complications

## 2014-01-26 NOTE — Op Note (Signed)
Patient brought to the operating room and prepped and draped in the usual manner.  Lid speculum inserted in right eye.  Stab incision made at the twelve o'clock position.  Provisc instilled in the anterior chamber.   A 2.4 mm. Stab incision was made temporally.  An anterior capsulotomy was done with a bent 25 gauge needle.  The nucleus was hydrodissected.  The Phaco tip was inserted in the anterior chamber and the nucleus was emulsified.  CDE was 6.53.  The cortical material was then removed with the I and A tip.  Posterior capsule was the polished.  The anterior chamber was deepened with Provisc.  A 19.5 Alcon SN60WF IOL was then inserted in the capsular bag.  Provisc was then removed with the I and A tip.  The wound was then hydrated.  Patient sent to the Recovery Room in good condition with follow up in my office.  Preoperative Diagnosis:  Nuclear Cataract OD Postoperative Diagnosis:  Same Procedure name: Kelman Phacoemulsification OD with IOL

## 2014-01-26 NOTE — H&P (Signed)
The patient was re examined and there is no change in the patients condition since the original H and P. 

## 2014-01-26 NOTE — Anesthesia Procedure Notes (Signed)
Procedure Name: MAC Date/Time: 01/26/2014 7:57 AM Performed by: Vista Deck Pre-anesthesia Checklist: Patient identified, Emergency Drugs available, Suction available, Timeout performed and Patient being monitored Patient Re-evaluated:Patient Re-evaluated prior to inductionOxygen Delivery Method: Nasal Cannula

## 2014-01-26 NOTE — Anesthesia Preprocedure Evaluation (Signed)
Anesthesia Evaluation  Patient identified by MRN, date of birth, ID band Patient awake    Reviewed: Allergy & Precautions, H&P , NPO status , Patient's Chart, lab work & pertinent test results  Airway Mallampati: II TM Distance: >3 FB     Dental  (+) Teeth Intact   Pulmonary neg pulmonary ROS,  breath sounds clear to auscultation        Cardiovascular hypertension, Pt. on medications Rhythm:Regular Rate:Normal     Neuro/Psych Seizures -, Well Controlled,  CVA    GI/Hepatic negative GI ROS,   Endo/Other  diabetes (diet control), Type 2  Renal/GU      Musculoskeletal  (+) Arthritis -,   Abdominal   Peds  Hematology   Anesthesia Other Findings   Reproductive/Obstetrics                           Anesthesia Physical Anesthesia Plan  ASA: III  Anesthesia Plan: MAC   Post-op Pain Management:    Induction: Intravenous  Airway Management Planned: Nasal Cannula  Additional Equipment:   Intra-op Plan:   Post-operative Plan:   Informed Consent: I have reviewed the patients History and Physical, chart, labs and discussed the procedure including the risks, benefits and alternatives for the proposed anesthesia with the patient or authorized representative who has indicated his/her understanding and acceptance.     Plan Discussed with:   Anesthesia Plan Comments:         Anesthesia Quick Evaluation

## 2014-01-26 NOTE — Discharge Instructions (Signed)
Mark Brady  01/26/2014           [facility] Instructions [place] [address]      1. Avoid closing eyes tightly. One often closes the eye tightly when laughing, talking, sneezing, coughing or if they feel irritated. At these times, you should be careful not to close your eyes tightly.  2. Instill eye drops as instructed. To instill drops in your eye, open it, look up and have someone gently pull the lower lid down and instill a couple of drops inside the lower lid.  3. Do not touch upper lid.  4. Take Advil or Tylenol for pain.  5. You may use either eye for near work, such as reading or sewing and you may watch television.  6. You may have your hair done at the beauty parlor at any time.  7. Wear dark glasses with or without your own glasses if you are in bright light.  8. Call our office at [phone] or [phone] if you have sharp pain in your eye or unusual symptoms.  9. Do not be concerned because vision in the operative eye is not good. It will not be good, no matter how successful the operation, until you get a special lens for it. Your old glasses will not be suited to the new eye that was operated on and you will not be ready for a new lens for about a month.  10. Follow up at the [facility] office.    I have received a copy of the above instructions and will follow them.

## 2014-01-27 ENCOUNTER — Encounter (HOSPITAL_COMMUNITY): Payer: Self-pay | Admitting: Ophthalmology

## 2014-01-27 LAB — GLUCOSE, CAPILLARY: GLUCOSE-CAPILLARY: 115 mg/dL — AB (ref 70–99)

## 2014-01-29 ENCOUNTER — Encounter: Payer: Self-pay | Admitting: Family Medicine

## 2014-01-29 ENCOUNTER — Ambulatory Visit (INDEPENDENT_AMBULATORY_CARE_PROVIDER_SITE_OTHER): Payer: Medicare Other | Admitting: Family Medicine

## 2014-01-29 VITALS — BP 140/78 | Ht 67.0 in | Wt 168.2 lb

## 2014-01-29 DIAGNOSIS — E785 Hyperlipidemia, unspecified: Secondary | ICD-10-CM

## 2014-01-29 DIAGNOSIS — E119 Type 2 diabetes mellitus without complications: Secondary | ICD-10-CM

## 2014-01-29 DIAGNOSIS — I10 Essential (primary) hypertension: Secondary | ICD-10-CM

## 2014-01-29 NOTE — Progress Notes (Signed)
   Subjective:    Patient ID: Mark Brady, male    DOB: 07/03/1929, 80 y.o.   MRN: [id number]  Diabetes He presents for his follow-up diabetic visit. He has type 2 diabetes mellitus. His disease course has been stable. There are no hypoglycemic associated symptoms. There are no diabetic associated symptoms. There are no hypoglycemic complications. Symptoms are stable. There are no diabetic complications. There are no known risk factors for coronary artery disease. Current diabetic treatment includes diet.   Patient has had bloodwork completed and results are in the system. Patient states he has no concerns at this time.  Results for orders placed during the hospital encounter of 01/26/14  GLUCOSE, CAPILLARY      Result Value Ref Range   Glucose-Capillary 115 (*) 70 - 99 mg/dL   Highest sugar 125 , mostly between 110 and 12  Exercising regularly  Patient claims compliance with his medication. No obvious side effects.  Mostly watching his diet.  No symptoms of stroke or mini stroke or neurological concern.  Review of Systems No headache no chest pain no back pain no abdominal pain no change in bowel habits no blood in stool ROS otherwise negative    Objective:   Physical Exam  Alert blood pressure good on repeat HEENT normal. Lungs clear. Heart regular in rhythm. No focal neurological deficits. Ankles without edema.      Assessment & Plan:  Impression 1 hypertension good control #2 diabetes good control. #3 hyperlipidemia suboptimum but patient very resistant to lipid medications. Plan diet exercise discussed. Vaccines discussed in encourage. Maintain same medications. Followup in 6 months. WSL

## 2014-02-04 ENCOUNTER — Encounter (HOSPITAL_COMMUNITY): Payer: Self-pay

## 2014-02-04 ENCOUNTER — Encounter (HOSPITAL_COMMUNITY)
Admission: RE | Admit: 2014-02-04 | Discharge: 2014-02-04 | Disposition: A | Discharge status: Home / Self Care | Payer: Medicare Other | Source: Ambulatory Visit | Attending: Ophthalmology | Admitting: Ophthalmology

## 2014-02-04 MED ORDER — ONDANSETRON HCL 4 MG/2ML IJ SOLN: 4.0000 mg | Freq: Once | INTRAMUSCULAR | Status: AC | PRN

## 2014-02-04 MED ORDER — FENTANYL CITRATE 0.05 MG/ML IJ SOLN: 25.0000 ug | INTRAMUSCULAR | Status: DC | PRN

## 2014-02-08 MED ORDER — PHENYLEPHRINE HCL 2.5 % OP SOLN
OPHTHALMIC | Status: AC
  Filled 2014-02-08: qty 15

## 2014-02-08 MED ORDER — CYCLOPENTOLATE-PHENYLEPHRINE OP SOLN OPTIME - NO CHARGE
OPHTHALMIC | Status: AC
  Filled 2014-02-08: qty 2

## 2014-02-08 MED ORDER — KETOROLAC TROMETHAMINE 0.5 % OP SOLN
OPHTHALMIC | Status: AC
  Filled 2014-02-08: qty 5

## 2014-02-08 MED ORDER — TETRACAINE HCL 0.5 % OP SOLN
OPHTHALMIC | Status: AC
  Filled 2014-02-08: qty 2

## 2014-02-09 ENCOUNTER — Ambulatory Visit (HOSPITAL_COMMUNITY): Payer: Medicare Other | Admitting: Anesthesiology

## 2014-02-09 ENCOUNTER — Encounter (HOSPITAL_COMMUNITY): Payer: Self-pay

## 2014-02-09 ENCOUNTER — Encounter (HOSPITAL_COMMUNITY)
Admission: RE | Disposition: A | Discharge status: Home / Self Care | Payer: Self-pay | Source: Ambulatory Visit | Attending: Ophthalmology

## 2014-02-09 ENCOUNTER — Ambulatory Visit (HOSPITAL_COMMUNITY)
Admission: RE | Admit: 2014-02-09 | Discharge: 2014-02-09 | Disposition: A | Discharge status: Home / Self Care | Payer: Medicare Other | Source: Ambulatory Visit | Attending: Ophthalmology | Admitting: Ophthalmology

## 2014-02-09 ENCOUNTER — Encounter (HOSPITAL_COMMUNITY): Payer: Medicare Other | Admitting: Anesthesiology

## 2014-02-09 DIAGNOSIS — Z7982 Long term (current) use of aspirin: Secondary | ICD-10-CM | POA: Insufficient documentation

## 2014-02-09 DIAGNOSIS — Z79899 Other long term (current) drug therapy: Secondary | ICD-10-CM | POA: Diagnosis not present

## 2014-02-09 DIAGNOSIS — E118 Type 2 diabetes mellitus with unspecified complications: Secondary | ICD-10-CM | POA: Diagnosis not present

## 2014-02-09 DIAGNOSIS — H2512 Age-related nuclear cataract, left eye: Secondary | ICD-10-CM | POA: Diagnosis present

## 2014-02-09 DIAGNOSIS — M1990 Unspecified osteoarthritis, unspecified site: Secondary | ICD-10-CM | POA: Insufficient documentation

## 2014-02-09 DIAGNOSIS — I10 Essential (primary) hypertension: Secondary | ICD-10-CM | POA: Insufficient documentation

## 2014-02-09 HISTORY — PX: CATARACT EXTRACTION W/PHACO: SHX586

## 2014-02-09 LAB — GLUCOSE, CAPILLARY: GLUCOSE-CAPILLARY: 126 mg/dL — AB (ref 70–99)

## 2014-02-09 SURGERY — PHACOEMULSIFICATION, CATARACT, WITH IOL INSERTION
Anesthesia: Monitor Anesthesia Care | Site: Eye | Laterality: Left

## 2014-02-09 MED ORDER — FENTANYL CITRATE 0.05 MG/ML IJ SOLN
25.0000 ug | INTRAMUSCULAR | Status: AC
  Administered 2014-02-09: 25 ug via INTRAVENOUS

## 2014-02-09 MED ORDER — LIDOCAINE HCL (PF) 1 % IJ SOLN
INTRAMUSCULAR | Status: AC
  Filled 2014-02-09: qty 30

## 2014-02-09 MED ORDER — BSS IO SOLN
INTRAOCULAR | Status: DC | PRN
  Administered 2014-02-09: 15 mL via INTRAOCULAR

## 2014-02-09 MED ORDER — EPINEPHRINE HCL 1 MG/ML IJ SOLN
INTRAMUSCULAR | Status: DC | PRN
  Administered 2014-02-09: 08:00:00

## 2014-02-09 MED ORDER — PROVISC 10 MG/ML IO SOLN
INTRAOCULAR | Status: DC | PRN
  Administered 2014-02-09: 0.85 mL via INTRAOCULAR

## 2014-02-09 MED ORDER — PHENYLEPHRINE HCL 2.5 % OP SOLN
1.0000 [drp] | OPHTHALMIC | Status: AC
  Administered 2014-02-09 (×3): 1 [drp] via OPHTHALMIC

## 2014-02-09 MED ORDER — FENTANYL CITRATE 0.05 MG/ML IJ SOLN
INTRAMUSCULAR | Status: AC
  Filled 2014-02-09: qty 2

## 2014-02-09 MED ORDER — TETRACAINE HCL 0.5 % OP SOLN
1.0000 [drp] | OPHTHALMIC | Status: AC
  Administered 2014-02-09 (×3): 1 [drp] via OPHTHALMIC

## 2014-02-09 MED ORDER — MIDAZOLAM HCL 2 MG/2ML IJ SOLN
1.0000 mg | INTRAMUSCULAR | Status: DC | PRN
  Administered 2014-02-09: 2 mg via INTRAVENOUS

## 2014-02-09 MED ORDER — MIDAZOLAM HCL 2 MG/2ML IJ SOLN
INTRAMUSCULAR | Status: AC
Start: 2014-02-09 — End: 2014-02-09
  Filled 2014-02-09: qty 2

## 2014-02-09 MED ORDER — LACTATED RINGERS IV SOLN
INTRAVENOUS | Status: DC | PRN
  Administered 2014-02-09: 07:00:00 via INTRAVENOUS

## 2014-02-09 MED ORDER — LACTATED RINGERS IV SOLN
INTRAVENOUS | Status: DC
  Administered 2014-02-09: 08:00:00 via INTRAVENOUS

## 2014-02-09 MED ORDER — KETOROLAC TROMETHAMINE 0.5 % OP SOLN
1.0000 [drp] | OPHTHALMIC | Status: AC
  Administered 2014-02-09 (×3): 1 [drp] via OPHTHALMIC

## 2014-02-09 MED ORDER — PROPOFOL 10 MG/ML IV BOLUS
INTRAVENOUS | Status: DC | PRN
  Administered 2014-02-09: 20 mg via INTRAVENOUS

## 2014-02-09 MED ORDER — PROPOFOL 10 MG/ML IV EMUL
INTRAVENOUS | Status: AC
  Filled 2014-02-09: qty 20

## 2014-02-09 MED ORDER — EPINEPHRINE HCL 1 MG/ML IJ SOLN
INTRAMUSCULAR | Status: AC
  Filled 2014-02-09: qty 1

## 2014-02-09 MED ORDER — CYCLOPENTOLATE-PHENYLEPHRINE 0.2-1 % OP SOLN
1.0000 [drp] | OPHTHALMIC | Status: AC
  Administered 2014-02-09 (×3): 1 [drp] via OPHTHALMIC

## 2014-02-09 MED ORDER — BUPIVACAINE HCL (PF) 0.5 % IJ SOLN
INTRAMUSCULAR | Status: AC
  Filled 2014-02-09: qty 30

## 2014-02-09 MED ORDER — BUPIVACAINE HCL (PF) 0.5 % IJ SOLN
INTRAMUSCULAR | Status: DC | PRN
  Administered 2014-02-09: 8 mL

## 2014-02-09 SURGICAL SUPPLY — 25 items
CAPSULAR TENSION RING-AMO (OPHTHALMIC RELATED) IMPLANT
CLOTH BEACON ORANGE TIMEOUT ST (SAFETY) ×2 IMPLANT
EYE SHIELD UNIVERSAL CLEAR (GAUZE/BANDAGES/DRESSINGS) ×2 IMPLANT
GLOVE BIO SURGEON STRL SZ 6.5 (GLOVE) ×1 IMPLANT
GLOVE BIO SURGEONS STRL SZ 6.5 (GLOVE) ×1
GLOVE ECLIPSE 6.5 STRL STRAW (GLOVE) IMPLANT
GLOVE ECLIPSE 7.0 STRL STRAW (GLOVE) IMPLANT
GLOVE EXAM NITRILE LRG STRL (GLOVE) IMPLANT
GLOVE EXAM NITRILE MD LF STRL (GLOVE) IMPLANT
GLOVE SKINSENSE NS SZ6.5 (GLOVE)
GLOVE SKINSENSE STRL SZ6.5 (GLOVE) IMPLANT
HEALON 5 0.6 ML (INTRAOCULAR LENS) IMPLANT
KIT VITRECTOMY (OPHTHALMIC RELATED) IMPLANT
PAD ARMBOARD 7.5X6 YLW CONV (MISCELLANEOUS) ×2 IMPLANT
PROC W NO LENS (INTRAOCULAR LENS)
PROC W SPEC LENS (INTRAOCULAR LENS)
PROCESS W NO LENS (INTRAOCULAR LENS) IMPLANT
PROCESS W SPEC LENS (INTRAOCULAR LENS) IMPLANT
RETRACTOR IRIS SIGHTPATH (OPHTHALMIC RELATED) IMPLANT
RING MALYGIN (MISCELLANEOUS) IMPLANT
SIGHTPATH CAT PROC W REG LENS (Ophthalmic Related) ×3 IMPLANT
TAPE SURG TRANSPARENT 2IN (GAUZE/BANDAGES/DRESSINGS) IMPLANT
TAPE TRANSPARENT 2IN (GAUZE/BANDAGES/DRESSINGS) ×2
VISCOELASTIC ADDITIONAL (OPHTHALMIC RELATED) IMPLANT
WATER STERILE IRR 250ML POUR (IV SOLUTION) ×2 IMPLANT

## 2014-02-09 NOTE — Transfer of Care (Signed)
Immediate Anesthesia Transfer of Care Note  Patient: [place]  Procedure(s) Performed: Procedure(s) with comments: CATARACT EXTRACTION PHACO AND INTRAOCULAR LENS PLACEMENT (IOC) (Left) - CDE:5.58  Patient Location: Short Stay  Anesthesia Type:MAC  Level of Consciousness: awake, alert , oriented and patient cooperative  Airway & Oxygen Therapy: Patient Spontanous Breathing  Post-op Assessment: Report given to PACU RN and Post -op Vital signs reviewed and stable  Post vital signs: Reviewed and stable  Complications: No apparent anesthesia complications

## 2014-02-09 NOTE — Anesthesia Preprocedure Evaluation (Signed)
Anesthesia Evaluation  Patient identified by MRN, date of birth, ID band Patient awake    Reviewed: Allergy & Precautions, H&P , NPO status , Patient's Chart, lab work & pertinent test results  Airway Mallampati: II  TM Distance: >3 FB     Dental  (+) Teeth Intact   Pulmonary neg pulmonary ROS,  breath sounds clear to auscultation        Cardiovascular hypertension, Pt. on medications Rhythm:Regular Rate:Normal     Neuro/Psych Seizures -,  CVA    GI/Hepatic negative GI ROS,   Endo/Other  diabetes, Type 2, Oral Hypoglycemic Agents  Renal/GU      Musculoskeletal  (+) Arthritis -,   Abdominal   Peds  Hematology   Anesthesia Other Findings   Reproductive/Obstetrics                             Anesthesia Physical Anesthesia Plan  ASA: III  Anesthesia Plan: MAC   Post-op Pain Management:    Induction: Intravenous  Airway Management Planned: Nasal Cannula  Additional Equipment:   Intra-op Plan:   Post-operative Plan:   Informed Consent: I have reviewed the patients History and Physical, chart, labs and discussed the procedure including the risks, benefits and alternatives for the proposed anesthesia with the patient or authorized representative who has indicated his/her understanding and acceptance.     Plan Discussed with:   Anesthesia Plan Comments:         Anesthesia Quick Evaluation

## 2014-02-09 NOTE — Op Note (Signed)
Patient brought to the operating room and prepped and draped in the usual manner. A seventh nerve block was given with Marcaine and Xylocaine. Lid speculum inserted in left eye.  Stab incision made at the twelve o'clock position.  Provisc instilled in the anterior chamber.   A 2.4 mm. Stab incision was made temporally.  An anterior capsulotomy was done with a bent 25 gauge needle.  The nucleus was hydrodissected.  The Phaco tip was inserted in the anterior chamber and the nucleus was emulsified.  CDE was 5.58.  The cortical material was then removed with the I and A tip.  Posterior capsule was the polished.  The anterior chamber was deepened with Provisc.  A 19.5 Alcon SN60WF IOL was then inserted in the capsular bag.  Provisc was then removed with the I and A tip.  The wound was then hydrated.  Patient sent to the Recovery Room in good condition with follow up in my office.  Preoperative Diagnosis:  Nuclear Cataract OS Postoperative Diagnosis:  Same Procedure name: Kelman Phacoemulsification OS with IOL

## 2014-02-09 NOTE — Anesthesia Postprocedure Evaluation (Signed)
  Anesthesia Post-op Note  Patient: Mark Brady  Procedure(s) Performed: Procedure(s) with comments: CATARACT EXTRACTION PHACO AND INTRAOCULAR LENS PLACEMENT (IOC) (Left) - CDE:5.58  Patient Location: Short Stay  Anesthesia Type:MAC  Level of Consciousness: awake, alert , oriented and patient cooperative  Airway and Oxygen Therapy: Patient Spontanous Breathing  Post-op Pain: none  Post-op Assessment: Post-op Vital signs reviewed, Patient's Cardiovascular Status Stable, Respiratory Function Stable, Patent Airway, No signs of Nausea or vomiting and Pain level controlled  Post-op Vital Signs: Reviewed and stable  Last Vitals:  Filed Vitals:   02/09/14 0715  BP: 165/89  Pulse:   Temp:   Resp: 21    Complications: No apparent anesthesia complications

## 2014-02-09 NOTE — H&P (Signed)
The patient was re examined and there is no change in the patients condition since the original H and P. 

## 2014-02-09 NOTE — Discharge Instructions (Signed)
Mark Brady  02/09/2014           [facility] Instructions [place] [address]      1. Avoid closing eyes tightly. One often closes the eye tightly when laughing, talking, sneezing, coughing or if they feel irritated. At these times, you should be careful not to close your eyes tightly.  2. Instill eye drops as instructed. To instill drops in your eye, open it, look up and have someone gently pull the lower lid down and instill a couple of drops inside the lower lid.  3. Do not touch upper lid.  4. Take Advil or Tylenol for pain.  5. You may use either eye for near work, such as reading or sewing and you may watch television.  6. You may have your hair done at the beauty parlor at any time.  7. Wear dark glasses with or without your own glasses if you are in bright light.  8. Call our office at [phone] or [phone] if you have sharp pain in your eye or unusual symptoms.  9. Do not be concerned because vision in the operative eye is not good. It will not be good, no matter how successful the operation, until you get a special lens for it. Your old glasses will not be suited to the new eye that was operated on and you will not be ready for a new lens for about a month.  10. Follow up at the [facility] office.    I have received a copy of the above instructions and will follow them.      PATIENT INSTRUCTIONS POST-ANESTHESIA  IMMEDIATELY FOLLOWING SURGERY:  Do not drive or operate machinery for the first twenty four hours after surgery.  Do not make any important decisions for twenty four hours after surgery or while taking narcotic pain medications or sedatives.  If you develop intractable nausea and vomiting or a severe headache please notify your doctor immediately.  FOLLOW-UP:  Please make an appointment with your surgeon as instructed. You do not need to follow up with anesthesia unless specifically instructed to do  so.  WOUND CARE INSTRUCTIONS (if applicable):  Keep a dry clean dressing on the anesthesia/puncture wound site if there is drainage.  Once the wound has quit draining you may leave it open to air.  Generally you should leave the bandage intact for twenty four hours unless there is drainage.  If the epidural site drains for more than 36-48 hours please call the anesthesia department.  QUESTIONS?:  Please feel free to call your physician or the hospital operator if you have any questions, and they will be happy to assist you.      Follow up today with Dr. Gershon Crane between 2-3 pm

## 2014-02-09 NOTE — Anesthesia Procedure Notes (Signed)
Procedure Name: MAC Date/Time: 02/09/2014 7:51 AM Performed by: Andree Elk, AMY A Pre-anesthesia Checklist: Patient identified, Timeout performed, Emergency Drugs available, Suction available and Patient being monitored Oxygen Delivery Method: Nasal cannula

## 2014-02-10 ENCOUNTER — Encounter (HOSPITAL_COMMUNITY): Payer: Self-pay | Admitting: Ophthalmology

## 2014-05-28 ENCOUNTER — Telehealth: Payer: Self-pay | Admitting: Family Medicine

## 2014-05-28 MED ORDER — TRIAMTERENE-HCTZ 37.5-25 MG PO CAPS: ORAL_CAPSULE | ORAL | Status: DC

## 2014-05-28 NOTE — Telephone Encounter (Signed)
Patient is not due to come back into the office until March-April time frame.  He only has 6 pills left of his triamterene-hydrochlorothiazide (DYAZIDE) 37.5-25 MG per capsule.  They want to know if he needs to come in sooner or if we can refill this med?   [facility]

## 2014-05-28 NOTE — Telephone Encounter (Signed)
Med sent. Pt notified.  

## 2014-08-02 ENCOUNTER — Ambulatory Visit (INDEPENDENT_AMBULATORY_CARE_PROVIDER_SITE_OTHER): Payer: Medicare Other | Admitting: Family Medicine

## 2014-08-02 ENCOUNTER — Encounter: Payer: Self-pay | Admitting: Family Medicine

## 2014-08-02 VITALS — BP 138/76 | Ht 67.0 in | Wt 172.1 lb

## 2014-08-02 DIAGNOSIS — I10 Essential (primary) hypertension: Secondary | ICD-10-CM

## 2014-08-02 DIAGNOSIS — I639 Cerebral infarction, unspecified: Secondary | ICD-10-CM

## 2014-08-02 DIAGNOSIS — Z23 Encounter for immunization: Secondary | ICD-10-CM | POA: Diagnosis not present

## 2014-08-02 DIAGNOSIS — E119 Type 2 diabetes mellitus without complications: Secondary | ICD-10-CM

## 2014-08-02 DIAGNOSIS — E785 Hyperlipidemia, unspecified: Secondary | ICD-10-CM | POA: Diagnosis not present

## 2014-08-02 LAB — POCT GLYCOSYLATED HEMOGLOBIN (HGB A1C): HEMOGLOBIN A1C: 6

## 2014-08-02 MED ORDER — TRIAMTERENE-HCTZ 37.5-25 MG PO CAPS: ORAL_CAPSULE | ORAL | Status: DC

## 2014-08-02 MED ORDER — CLOTRIMAZOLE-BETAMETHASONE 1-0.05 % EX CREA: 1.0000 "application " | TOPICAL_CREAM | Freq: Two times a day (BID) | CUTANEOUS | Status: DC

## 2014-08-02 NOTE — Progress Notes (Signed)
   Subjective:    Patient ID: Mark Brady, male    DOB: 05-19-1929, 81 y.o.   MRN: [id number]  Diabetes He presents for his follow-up diabetic visit. He has type 2 diabetes mellitus. His disease course has been stable. There are no hypoglycemic associated symptoms. There are no diabetic associated symptoms. There are no hypoglycemic complications. Symptoms are stable. There are no diabetic complications. There are no known risk factors for coronary artery disease. Current diabetic treatment includes diet. He is compliant with treatment all of the time.   Patient states he has no concerns at this time.   Patient compliant with his diet as far as lipids and cholesterol. Trying to cut fats down. Sticking with red yeast rice extract.  Has had no further stroke symptoms.  Has an intertrigo rash. Pruritic in nature. Lotrisone has helped in the past.   History of stroke. No further TIA or stroke symptoms.  Exercising a fair amount Results for orders placed or performed in visit on 08/02/14  POCT glycosylated hemoglobin (Hb A1C)  Result Value Ref Range   Hemoglobin A1C 6.0     Review of Systems No headache no chest pain no back pain no abdominal pain no change in bowel habits no blood in stool    Objective:   Physical Exam  Alert vital stable blood pressure good on repeat. HEENT normal. No focal neurological deficits. Lungs clear heart regular in rhythm ankles without edema skin intertrigo rash groin      Assessment & Plan:  Impression 1 hypertension good control #2 type 2 diabetes good control #3 history of stroke clinically stable #4 intertrigo rash discussed #5 hyperlipidemia LDL suboptimum patient actually resistant to meds. Plan maintain same medications. Add Lotrisone. Diet exercise discussed. Prevnar injections reduced pneumonia. Questions answered. WSL

## 2014-08-04 ENCOUNTER — Other Ambulatory Visit: Payer: Self-pay | Admitting: *Deleted

## 2014-08-04 MED ORDER — CLOTRIMAZOLE-BETAMETHASONE 1-0.05 % EX CREA: 1.0000 "application " | TOPICAL_CREAM | Freq: Two times a day (BID) | CUTANEOUS | Status: DC

## 2014-08-05 ENCOUNTER — Telehealth: Payer: Self-pay | Admitting: *Deleted

## 2014-08-05 MED ORDER — KETOCONAZOLE 2 % EX CREA: 1.0000 "application " | TOPICAL_CREAM | Freq: Two times a day (BID) | CUTANEOUS | Status: DC

## 2014-08-05 MED ORDER — TRIAMCINOLONE ACETONIDE 0.1 % EX CREA: 1.0000 "application " | TOPICAL_CREAM | Freq: Two times a day (BID) | CUTANEOUS | Status: DC

## 2014-08-05 NOTE — Telephone Encounter (Signed)
Rx sent electronically to pharmacy. 

## 2014-08-05 NOTE — Telephone Encounter (Signed)
Fax from [facility] for drug change request. Clotrimazole-betamethasone cream 15 gm apply bid. Plan does not cover medication prescribed. Per rx benefit plan alternative medications include the 2 individual ingredients rather than the combo.

## 2014-08-05 NOTE — Telephone Encounter (Signed)
Ok prescribe triamcin.1  Plus ketoconazole cr, two sepe ingred plus three ref ea

## 2014-08-11 ENCOUNTER — Other Ambulatory Visit: Payer: Self-pay | Admitting: Family Medicine

## 2014-08-13 ENCOUNTER — Other Ambulatory Visit: Payer: Self-pay | Admitting: Family Medicine

## 2014-08-13 ENCOUNTER — Other Ambulatory Visit: Payer: Self-pay | Admitting: *Deleted

## 2014-08-13 MED ORDER — ACCU-CHEK SOFTCLIX LANCETS MISC: Status: DC

## 2014-09-05 ENCOUNTER — Encounter (HOSPITAL_COMMUNITY): Payer: Self-pay

## 2014-09-05 ENCOUNTER — Observation Stay (HOSPITAL_COMMUNITY)
Admission: EM | Admit: 2014-09-05 | Discharge: 2014-09-06 | Disposition: A | Discharge status: Home / Self Care | Payer: Medicare Other | Attending: Internal Medicine | Admitting: Internal Medicine

## 2014-09-05 ENCOUNTER — Emergency Department (HOSPITAL_COMMUNITY): Payer: Medicare Other

## 2014-09-05 DIAGNOSIS — E119 Type 2 diabetes mellitus without complications: Secondary | ICD-10-CM

## 2014-09-05 DIAGNOSIS — E1169 Type 2 diabetes mellitus with other specified complication: Secondary | ICD-10-CM

## 2014-09-05 DIAGNOSIS — Z79899 Other long term (current) drug therapy: Secondary | ICD-10-CM | POA: Insufficient documentation

## 2014-09-05 DIAGNOSIS — R0602 Shortness of breath: Secondary | ICD-10-CM | POA: Diagnosis not present

## 2014-09-05 DIAGNOSIS — I10 Essential (primary) hypertension: Secondary | ICD-10-CM | POA: Diagnosis present

## 2014-09-05 DIAGNOSIS — M1990 Unspecified osteoarthritis, unspecified site: Secondary | ICD-10-CM | POA: Insufficient documentation

## 2014-09-05 DIAGNOSIS — Z8673 Personal history of transient ischemic attack (TIA), and cerebral infarction without residual deficits: Secondary | ICD-10-CM | POA: Insufficient documentation

## 2014-09-05 DIAGNOSIS — Z7982 Long term (current) use of aspirin: Secondary | ICD-10-CM | POA: Diagnosis not present

## 2014-09-05 DIAGNOSIS — E785 Hyperlipidemia, unspecified: Secondary | ICD-10-CM | POA: Diagnosis present

## 2014-09-05 DIAGNOSIS — R079 Chest pain, unspecified: Principal | ICD-10-CM | POA: Diagnosis present

## 2014-09-05 LAB — COMPREHENSIVE METABOLIC PANEL
ALT: 15 U/L — AB (ref 17–63)
ANION GAP: 9 (ref 5–15)
AST: 18 U/L (ref 15–41)
Albumin: 4 g/dL (ref 3.5–5.0)
Alkaline Phosphatase: 39 U/L (ref 38–126)
BUN: 19 mg/dL (ref 6–20)
CHLORIDE: 103 mmol/L (ref 101–111)
CO2: 26 mmol/L (ref 22–32)
Calcium: 9.1 mg/dL (ref 8.9–10.3)
Creatinine, Ser: 1.25 mg/dL — ABNORMAL HIGH (ref 0.61–1.24)
GFR calc Af Amer: >60 mL/min (ref >60)
GFR calc non Af Amer: 52 mL/min — ABNORMAL LOW (ref >60)
GLUCOSE: 165 mg/dL — AB (ref 65–99)
Potassium: 3.8 mmol/L (ref 3.5–5.1)
Sodium: 138 mmol/L (ref 135–145)
Total Bilirubin: 0.7 mg/dL (ref 0.3–1.2)
Total Protein: 7 g/dL (ref 6.5–8.1)

## 2014-09-05 LAB — URINALYSIS, ROUTINE W REFLEX MICROSCOPIC
Bilirubin Urine: NEGATIVE
GLUCOSE, UA: NEGATIVE mg/dL
HGB URINE DIPSTICK: NEGATIVE
KETONES UR: NEGATIVE mg/dL
Leukocytes, UA: NEGATIVE
Nitrite: NEGATIVE
Protein, ur: NEGATIVE mg/dL
SPECIFIC GRAVITY, URINE: 1.01 (ref 1.005–1.030)
Urobilinogen, UA: 0.2 mg/dL (ref 0.0–1.0)
pH: 7 (ref 5.0–8.0)

## 2014-09-05 LAB — TROPONIN I: Troponin I: <0.03 ng/mL (ref <0.031)

## 2014-09-05 LAB — CBC
HEMATOCRIT: 41 % (ref 39.0–52.0)
Hemoglobin: 13.8 g/dL (ref 13.0–17.0)
MCH: 31.7 pg (ref 26.0–34.0)
MCHC: 33.7 g/dL (ref 30.0–36.0)
MCV: 94 fL (ref 78.0–100.0)
PLATELETS: 273 10*3/uL (ref 150–400)
RBC: 4.36 MIL/uL (ref 4.22–5.81)
RDW: 12.9 % (ref 11.5–15.5)
WBC: 8.6 10*3/uL (ref 4.0–10.5)

## 2014-09-05 LAB — TSH: TSH: 3.819 u[IU]/mL (ref 0.350–4.500)

## 2014-09-05 LAB — GLUCOSE, CAPILLARY
GLUCOSE-CAPILLARY: 89 mg/dL (ref 65–99)
Glucose-Capillary: 137 mg/dL — ABNORMAL HIGH (ref 65–99)

## 2014-09-05 MED ORDER — ONDANSETRON HCL 4 MG/2ML IJ SOLN: 4.0000 mg | Freq: Four times a day (QID) | INTRAMUSCULAR | Status: DC | PRN

## 2014-09-05 MED ORDER — ASPIRIN 81 MG PO CHEW
324.0000 mg | CHEWABLE_TABLET | Freq: Once | ORAL | Status: DC
  Filled 2014-09-05: qty 4

## 2014-09-05 MED ORDER — ONDANSETRON HCL 4 MG PO TABS: 4.0000 mg | ORAL_TABLET | Freq: Four times a day (QID) | ORAL | Status: DC | PRN

## 2014-09-05 MED ORDER — ACETAMINOPHEN 325 MG PO TABS: 650.0000 mg | ORAL_TABLET | Freq: Four times a day (QID) | ORAL | Status: DC | PRN

## 2014-09-05 MED ORDER — POTASSIUM CHLORIDE IN NACL 20-0.9 MEQ/L-% IV SOLN
INTRAVENOUS | Status: DC
  Administered 2014-09-05 – 2014-09-06 (×2): via INTRAVENOUS

## 2014-09-05 MED ORDER — ASPIRIN 81 MG PO CHEW
243.0000 mg | CHEWABLE_TABLET | Freq: Once | ORAL | Status: AC
  Administered 2014-09-05: 243 mg via ORAL
  Filled 2014-09-05: qty 3

## 2014-09-05 MED ORDER — HEPARIN SODIUM (PORCINE) 5000 UNIT/ML IJ SOLN
5000.0000 [IU] | Freq: Three times a day (TID) | INTRAMUSCULAR | Status: DC
  Administered 2014-09-05 – 2014-09-06 (×3): 5000 [IU] via SUBCUTANEOUS
  Filled 2014-09-05 (×2): qty 1

## 2014-09-05 MED ORDER — INSULIN ASPART 100 UNIT/ML ~~LOC~~ SOLN
0.0000 [IU] | Freq: Three times a day (TID) | SUBCUTANEOUS | Status: DC
  Administered 2014-09-05: 1 [IU] via SUBCUTANEOUS

## 2014-09-05 MED ORDER — SODIUM CHLORIDE 0.9 % IV SOLN
INTRAVENOUS | Status: DC
  Administered 2014-09-05: 1000 mL via INTRAVENOUS

## 2014-09-05 MED ORDER — INSULIN ASPART 100 UNIT/ML ~~LOC~~ SOLN: 0.0000 [IU] | Freq: Every day | SUBCUTANEOUS | Status: DC

## 2014-09-05 MED ORDER — ACETAMINOPHEN 650 MG RE SUPP: 650.0000 mg | Freq: Four times a day (QID) | RECTAL | Status: DC | PRN

## 2014-09-05 MED ORDER — ASPIRIN 81 MG PO CHEW
81.0000 mg | CHEWABLE_TABLET | Freq: Every day | ORAL | Status: DC
  Administered 2014-09-05 – 2014-09-06 (×2): 81 mg via ORAL
  Filled 2014-09-05 (×2): qty 1

## 2014-09-05 MED ORDER — NITROGLYCERIN 0.4 MG SL SUBL
0.4000 mg | SUBLINGUAL_TABLET | SUBLINGUAL | Status: DC | PRN
  Filled 2014-09-05: qty 1

## 2014-09-05 NOTE — H&P (Signed)
[facility] History and Physical  Mark Brady HQP:[id number] DOB: Jun 05, 1929 DOA: 09/05/2014  Referring physician: Rogene Houston, ED PCP: Mickie Hillier, MD   Chief Complaint: chest pain  HPI: Mark Brady is a 81 y.o. male who presents to the hospital with complaints of chest pain. Patient is a poor historian and therefore details of history somewhat limited. He reports having left-sided chest pain intermittently for the last several months. Reports having episodes approximately 2-3 times a week. Pain is described as left-sided chest pain, pressure type that occurs while the patient is sitting. This improves when the patient starts walking. He may have some mild associated shortness of breath, no nausea, no vomiting, no diaphoresis, no lightheadedness or dizziness. This morning, his pain began while he was in the kitchen. He has not had any recent fever, cough. He was evaluated in the emergency room and referred for admission for observation.   Review of Systems:  Pertinent positives as per HPI, otherwise negative   Past Medical History  Diagnosis Date  . Hypertension   . Arthritis   . History of elevated PSA   . Hyperlipidemia   . Stroke   . Diabetes mellitus without complication     diet controlled   Past Surgical History  Procedure Laterality Date  . Colonoscopy    . Cataract extraction w/phaco Right 01/26/2014    Procedure: CATARACT EXTRACTION PHACO AND INTRAOCULAR LENS PLACEMENT (IOC);  Surgeon: Elta Guadeloupe T. Gershon Crane, MD;  Location: [facility];  Service: Ophthalmology;  Laterality: Right;  CDE 6.53  . Cataract extraction w/phaco Left 02/09/2014    Procedure: CATARACT EXTRACTION PHACO AND INTRAOCULAR LENS PLACEMENT (IOC);  Surgeon: Elta Guadeloupe T. Gershon Crane, MD;  Location: [facility];  Service: Ophthalmology;  Laterality: Left;  CDE:5.58   Social History:  reports that he has never smoked. He does not have any smokeless tobacco history on file. He reports that he does not drink alcohol or  use illicit drugs.  No Known Allergies  Family History  Problem Relation Age of Onset  . Arthritis      Prior to Admission medications   Medication Sig Start Date End Date Taking? Authorizing Provider  Alum & Mag Hydroxide-Simeth (ANTACID ANTI-GAS PO) Take 1 tablet by mouth daily as needed (gas pain).    Yes Historical Provider, MD  aspirin 81 MG chewable tablet Chew 81 mg by mouth daily.   Yes Historical Provider, MD  clotrimazole-betamethasone (LOTRISONE) cream Apply 1 application topically 2 (two) times daily. Patient taking differently: Apply 1 application topically 2 (two) times daily as needed (rash).  08/04/14  Yes Mikey Kirschner, MD  Coconut Oil 1000 MG CAPS Take 1 capsule by mouth daily.   Yes Historical Provider, MD  ketoconazole (NIZORAL) 2 % cream Apply 1 application topically 2 (two) times daily. Patient taking differently: Apply 1 application topically 2 (two) times daily as needed (rash).  08/05/14  Yes Mikey Kirschner, MD  Misc Natural Products (COLON CARE PO) Take 1 capsule by mouth daily.    Yes Historical Provider, MD  OVER THE COUNTER MEDICATION Take 2 capsules by mouth daily. Omega Q Plus Softgel capsules   Yes Historical Provider, MD  OVER THE COUNTER MEDICATION Take 1 tablet by mouth daily. ProstatD3   Yes Historical Provider, MD  Red Yeast Rice Extract 600 MG CAPS Take 1,200 mg by mouth daily.   Yes Historical Provider, MD  triamcinolone cream (KENALOG) 0.1 % Apply 1 application topically 2 (two) times daily. Patient taking differently: Apply 1  application topically 2 (two) times daily as needed (rash).  08/05/14  Yes Mikey Kirschner, MD  triamterene-hydrochlorothiazide (DYAZIDE) 37.5-25 MG per capsule TAKE 1 CAPSULE BY MOUTH EVERY DAY 08/02/14  Yes Mikey Kirschner, MD  vitamin C (ASCORBIC ACID) 500 MG tablet Take 1,000 mg by mouth daily.   Yes Historical Provider, MD  ACCU-CHEK AVIVA PLUS test strip USE ONE STRIP TO CHECK GLUCOSE ONCE DAILY 08/13/14   Mikey Kirschner, MD  ACCU-CHEK SOFTCLIX LANCETS lancets USE ONE LANCET ONCE DAILY 08/13/14   Mikey Kirschner, MD   Physical Exam: Filed Vitals:   09/05/14 1005 09/05/14 1111 09/05/14 1140 09/05/14 1323  BP: 127/56 120/77 143/81 144/78  Pulse: 86 60 56 64  Temp:  97.7 F (36.5 C) 97.8 F (36.6 C) 97.6 F (36.4 C)  TempSrc:  Oral    Resp: 16 16 16 16   Height:   5\' 7"  (1.702 m)   Weight:   81.647 kg (180 lb)   SpO2: 98% 98% 98% 98%    Wt Readings from Last 3 Encounters:  09/05/14 81.647 kg (180 lb)  08/02/14 78.075 kg (172 lb 2 oz)  01/29/14 76.318 kg (168 lb 4 oz)    General:  Appears calm and comfortable Eyes: PERRL, normal lids, irises & conjunctiva ENT: grossly normal hearing, lips & tongue Neck: no LAD, masses or thyromegaly Cardiovascular: RRR, no m/r/g. No LE edema. Telemetry: SR, no arrhythmias  Respiratory: CTA bilaterally, no w/r/r. Normal respiratory effort. Abdomen: soft, ntnd Skin: no rash or induration seen on limited exam Musculoskeletal: grossly normal tone BUE/BLE, some mild tenderness over left chest Psychiatric: grossly normal mood and affect, speech fluent and appropriate Neurologic: grossly non-focal.          Labs on Admission:  Basic Metabolic Panel:  Recent Labs Lab 09/05/14 0922  NA 138  K 3.8  CL 103  CO2 26  GLUCOSE 165*  BUN 19  CREATININE 1.25*  CALCIUM 9.1   Liver Function Tests:  Recent Labs Lab 09/05/14 0922  AST 18  ALT 15*  ALKPHOS 39  BILITOT 0.7  PROT 7.0  ALBUMIN 4.0   No results for input(s): LIPASE, AMYLASE in the last 168 hours. No results for input(s): AMMONIA in the last 168 hours. CBC:  Recent Labs Lab 09/05/14 0922  WBC 8.6  HGB 13.8  HCT 41.0  MCV 94.0  PLT 273   Cardiac Enzymes:  Recent Labs Lab 09/05/14 0922  TROPONINI <0.03    BNP (last 3 results) No results for input(s): BNP in the last 8760 hours.  ProBNP (last 3 results) No results for input(s): PROBNP in the last 8760 hours.  CBG: No  results for input(s): GLUCAP in the last 168 hours.  Radiological Exams on Admission: Dg Chest Portable 1 View  09/05/2014   CLINICAL DATA:  Acute onset chest pain. Current history of hypertension, diabetes and hyperlipidemia.  EXAM: PORTABLE CHEST - 1 VIEW  COMPARISON:  06/02/2010 and earlier.  FINDINGS: Cardiac silhouette upper normal in size to slightly enlarged for AP portable technique, unchanged. Prominent paracardiac fat pads. Lungs clear. Bronchovascular markings normal. Pulmonary vascularity normal. No visible pleural effusions. No pneumothorax.  IMPRESSION: No acute cardiopulmonary disease.   Electronically Signed   By: Evangeline Dakin M.D.   On: 09/05/2014 09:34    EKG: Independently reviewed. Ventricular bigeminy  Assessment/Plan Principal Problem:   Chest pain Active Problems:   Essential hypertension, benign   Hyperlipidemia LDL goal <100   Diabetes  1. Chest pain. Appears to be fairly atypical. Cardiac enzymes are negative on admission and EKG does not show any acute findings. We will cycle cardiac markers, check echocardiogram and repeat EKG in the morning. If workup is negative, I anticipate he should be able to discharge tomorrow and follow-up with cardiology for further management. 2. Diabetes. Appears to be diet controlled. Continue on sliding scale insulin for now. 3. Hypertension. Continue outpatient regimen. 4. Hyperlipidemia. Patient manages with diet.   Code Status: full code DVT Prophylaxis: heparin Family Communication: discussed with wife at the bedside Disposition Plan: discharge home once improved               Time spent: 57mins  Leshae Mcclay [facility] Pager [phone]

## 2014-09-05 NOTE — ED Provider Notes (Signed)
CSN: [id number]     Arrival date & time 09/05/14  0845 History  This chart was scribed for Fredia Sorrow, MD by Dellis Filbert, ED Scribe. The patient was seen in [facility] and the patient's care was started at 9:23 AM.  Chief Complaint  Patient presents with  . Chest Pain   Patient is a 81 y.o. male presenting with chest pain. The history is provided by the patient, a relative and the spouse. No language interpreter was used.  Chest Pain Pain location:  L chest Pain quality: pressure   Pain radiates to:  Does not radiate Pain radiates to the back: no   Pain severity:  Mild Duration:  6 months Timing:  Intermittent Progression:  Worsening Chronicity:  Recurrent Relieved by:  Nothing Worsened by:  Nothing tried Ineffective treatments:  None tried Associated symptoms: shortness of breath   Associated symptoms: no abdominal pain, no back pain, no cough, no fever, no headache, no nausea and not vomiting     HPI Comments: Friend L Mccuistion is a 81 y.o. male who presents to the Emergency Department complaining of left sided chest pain onset 6 months ago pain worsened yesterday afternoon and this morning. He describes the pain as intermittent and a pressure rated 1-2/10. Pt's wife states he had gotten up in the evening around midnight due to difficulty breathing. His wife and son noticed he rubs his chest frequently. He does take an 81 mg aspirin daily. No history of cardiac disease.  Past Medical History  Diagnosis Date  . Hypertension   . Arthritis   . History of elevated PSA   . Hyperlipidemia   . Stroke   . Diabetes mellitus without complication     diet controlled   Past Surgical History  Procedure Laterality Date  . Colonoscopy    . Cataract extraction w/phaco Right 01/26/2014    Procedure: CATARACT EXTRACTION PHACO AND INTRAOCULAR LENS PLACEMENT (IOC);  Surgeon: Elta Guadeloupe T. Gershon Crane, MD;  Location: [facility];  Service: Ophthalmology;  Laterality: Right;  CDE 6.53  . Cataract  extraction w/phaco Left 02/09/2014    Procedure: CATARACT EXTRACTION PHACO AND INTRAOCULAR LENS PLACEMENT (IOC);  Surgeon: Elta Guadeloupe T. Gershon Crane, MD;  Location: [facility];  Service: Ophthalmology;  Laterality: Left;  CDE:5.58   Family History  Problem Relation Age of Onset  . Arthritis     History  Substance Use Topics  . Smoking status: Never Smoker   . Smokeless tobacco: Not on file  . Alcohol Use: No    Review of Systems  Constitutional: Negative for fever and chills.  HENT: Negative for rhinorrhea and sore throat.   Eyes: Negative for visual disturbance.  Respiratory: Positive for shortness of breath. Negative for cough.   Cardiovascular: Positive for chest pain. Negative for leg swelling.  Gastrointestinal: Negative for nausea, vomiting, abdominal pain and diarrhea.  Genitourinary: Negative for dysuria and hematuria.  Musculoskeletal: Negative for back pain.  Skin: Negative for rash.  Neurological: Negative for headaches.  Hematological: Does not bruise/bleed easily.   Allergies  Review of patient's allergies indicates no known allergies.  Home Medications   Prior to Admission medications   Medication Sig Start Date End Date Taking? Authorizing Provider  Alum & Mag Hydroxide-Simeth (ANTACID ANTI-GAS PO) Take 1 tablet by mouth daily as needed (gas pain).    Yes Historical Provider, MD  aspirin 81 MG chewable tablet Chew 81 mg by mouth daily.   Yes Historical Provider, MD  clotrimazole-betamethasone (LOTRISONE) cream Apply 1 application topically  2 (two) times daily. Patient taking differently: Apply 1 application topically 2 (two) times daily as needed (rash).  08/04/14  Yes Mikey Kirschner, MD  Coconut Oil 1000 MG CAPS Take 1 capsule by mouth daily.   Yes Historical Provider, MD  ketoconazole (NIZORAL) 2 % cream Apply 1 application topically 2 (two) times daily. Patient taking differently: Apply 1 application topically 2 (two) times daily as needed (rash).  08/05/14  Yes Mikey Kirschner, MD  Misc Natural Products (COLON CARE PO) Take 1 capsule by mouth daily.    Yes Historical Provider, MD  OVER THE COUNTER MEDICATION Take 2 capsules by mouth daily. Omega Q Plus Softgel capsules   Yes Historical Provider, MD  OVER THE COUNTER MEDICATION Take 1 tablet by mouth daily. ProstatD3   Yes Historical Provider, MD  Red Yeast Rice Extract 600 MG CAPS Take 1,200 mg by mouth daily.   Yes Historical Provider, MD  triamcinolone cream (KENALOG) 0.1 % Apply 1 application topically 2 (two) times daily. Patient taking differently: Apply 1 application topically 2 (two) times daily as needed (rash).  08/05/14  Yes Mikey Kirschner, MD  triamterene-hydrochlorothiazide (DYAZIDE) 37.5-25 MG per capsule TAKE 1 CAPSULE BY MOUTH EVERY DAY 08/02/14  Yes Mikey Kirschner, MD  vitamin C (ASCORBIC ACID) 500 MG tablet Take 1,000 mg by mouth daily.   Yes Historical Provider, MD  ACCU-CHEK AVIVA PLUS test strip USE ONE STRIP TO CHECK GLUCOSE ONCE DAILY 08/13/14   Mikey Kirschner, MD  ACCU-CHEK SOFTCLIX LANCETS lancets USE ONE LANCET ONCE DAILY 08/13/14   Mikey Kirschner, MD   BP 127/56 mmHg  Pulse 86  Temp(Src) 97.6 F (36.4 C) (Oral)  Resp 16  Ht 5\' 7"  (1.702 m)  Wt 180 lb (81.647 kg)  BMI 28.19 kg/m2  SpO2 98% Physical Exam  Constitutional: He is oriented to person, place, and time. He appears well-developed and well-nourished.  HENT:  Head: Normocephalic and atraumatic.  Eyes: Conjunctivae and EOM are normal. Pupils are equal, round, and reactive to light. No scleral icterus.  Neck: Normal range of motion.  Cardiovascular: Normal rate, regular rhythm and normal heart sounds.   Pulmonary/Chest: Effort normal and breath sounds normal.  Musculoskeletal: Normal range of motion.  Neurological: He is alert and oriented to person, place, and time. No cranial nerve deficit.  Skin: Skin is warm and dry.  Psychiatric: He has a normal mood and affect. His behavior is normal.  Nursing note and vitals  reviewed.   ED Course  Procedures  DIAGNOSTIC STUDIES: Oxygen Saturation is 98% on room air, normal by my interpretation.    COORDINATION OF CARE: 9:37 AM Discussed treatment plan with pt at bedside and pt agreed to plan.  Labs Review Labs Reviewed  COMPREHENSIVE METABOLIC PANEL - Abnormal; Notable for the following:    Glucose, Bld 165 (*)    Creatinine, Ser 1.25 (*)    ALT 15 (*)    GFR calc non Af Amer 52 (*)    All other components within normal limits  CBC  TROPONIN I   Results for orders placed or performed during the hospital encounter of 09/05/14  CBC  Result Value Ref Range   WBC 8.6 4.0 - 10.5 K/uL   RBC 4.36 4.22 - 5.81 MIL/uL   Hemoglobin 13.8 13.0 - 17.0 g/dL   HCT 41.0 39.0 - 52.0 %   MCV 94.0 78.0 - 100.0 fL   MCH 31.7 26.0 - 34.0 pg   MCHC  33.7 30.0 - 36.0 g/dL   RDW 12.9 11.5 - 15.5 %   Platelets 273 150 - 400 K/uL  Comprehensive metabolic panel  Result Value Ref Range   Sodium 138 135 - 145 mmol/L   Potassium 3.8 3.5 - 5.1 mmol/L   Chloride 103 101 - 111 mmol/L   CO2 26 22 - 32 mmol/L   Glucose, Bld 165 (H) 65 - 99 mg/dL   BUN 19 6 - 20 mg/dL   Creatinine, Ser 1.25 (H) 0.61 - 1.24 mg/dL   Calcium 9.1 8.9 - 10.3 mg/dL   Total Protein 7.0 6.5 - 8.1 g/dL   Albumin 4.0 3.5 - 5.0 g/dL   AST 18 15 - 41 U/L   ALT 15 (L) 17 - 63 U/L   Alkaline Phosphatase 39 38 - 126 U/L   Total Bilirubin 0.7 0.3 - 1.2 mg/dL   GFR calc non Af Amer 52 (L) >60 mL/min   GFR calc Af Amer >60 >60 mL/min   Anion gap 9 5 - 15  Troponin I  Result Value Ref Range   Troponin I <0.03 <0.031 ng/mL   Dg Chest Portable 1 View  09/05/2014   CLINICAL DATA:  Acute onset chest pain. Current history of hypertension, diabetes and hyperlipidemia.  EXAM: PORTABLE CHEST - 1 VIEW  COMPARISON:  06/02/2010 and earlier.  FINDINGS: Cardiac silhouette upper normal in size to slightly enlarged for AP portable technique, unchanged. Prominent paracardiac fat pads. Lungs clear. Bronchovascular  markings normal. Pulmonary vascularity normal. No visible pleural effusions. No pneumothorax.  IMPRESSION: No acute cardiopulmonary disease.   Electronically Signed   By: Evangeline Dakin M.D.   On: 09/05/2014 09:34    Medications  nitroGLYCERIN (NITROSTAT) SL tablet 0.4 mg (not administered)  0.9 %  sodium chloride infusion (1,000 mLs Intravenous New Bag/Given 09/05/14 0958)  aspirin chewable tablet 243 mg (243 mg Oral Given 09/05/14 1003)     MDM   Final diagnoses:  Chest pain, unspecified chest pain type   Patient with a history of chest discomfort on and off over the past several months. Worse yesterday afternoon a lot worse this morning associated with shortness of breath. Did not have any distinct pain during the night. Patient got concerned and came in was concerned that maybe something was wrong with his heart. Also was worried about shortness of breath. Patient's saturations are upper ED percent no tachycardia. EKG has some subtle ST segment depressions laterally which do appear to be new. Patient's first troponin was negative. Patient given aspirin and nitroglycerin without any real improvement in the chest pressure discomfort which is rated as a 1 or 2 out of 10.  Chest x-ray is negative for any acute problems. First troponin was negative electrolytes without significant abnormalities other than elevation slightly and the creatinine of 1.25 Anemia no leukocytosis. Patient due to the worsening pain needs chest pain rule out admission and serial troponins.  I personally performed the services described in this documentation, which was scribed in my presence. The recorded information has been reviewed and is accurate.       Fredia Sorrow, MD 09/05/14 1046

## 2014-09-06 ENCOUNTER — Observation Stay (HOSPITAL_BASED_OUTPATIENT_CLINIC_OR_DEPARTMENT_OTHER): Payer: Medicare Other

## 2014-09-06 DIAGNOSIS — I10 Essential (primary) hypertension: Secondary | ICD-10-CM | POA: Diagnosis not present

## 2014-09-06 DIAGNOSIS — R079 Chest pain, unspecified: Secondary | ICD-10-CM

## 2014-09-06 DIAGNOSIS — R0602 Shortness of breath: Secondary | ICD-10-CM | POA: Diagnosis not present

## 2014-09-06 DIAGNOSIS — M1990 Unspecified osteoarthritis, unspecified site: Secondary | ICD-10-CM | POA: Diagnosis not present

## 2014-09-06 DIAGNOSIS — E785 Hyperlipidemia, unspecified: Secondary | ICD-10-CM | POA: Diagnosis not present

## 2014-09-06 DIAGNOSIS — E119 Type 2 diabetes mellitus without complications: Secondary | ICD-10-CM | POA: Diagnosis not present

## 2014-09-06 LAB — HEMOGLOBIN A1C
Hgb A1c MFr Bld: 6.5 % — ABNORMAL HIGH (ref 4.8–5.6)
Mean Plasma Glucose: 140 mg/dL

## 2014-09-06 LAB — GLUCOSE, CAPILLARY
Glucose-Capillary: 97 mg/dL (ref 65–99)
Glucose-Capillary: 92 mg/dL (ref 65–99)

## 2014-09-06 LAB — TROPONIN I: Troponin I: <0.03 ng/mL (ref <0.031)

## 2014-09-06 MED ORDER — PANTOPRAZOLE SODIUM 40 MG PO TBEC
40.0000 mg | DELAYED_RELEASE_TABLET | Freq: Every day | ORAL | Status: DC
  Administered 2014-09-06: 40 mg via ORAL
  Filled 2014-09-06: qty 1

## 2014-09-06 NOTE — Progress Notes (Signed)
Pt states he is unable to use the restroom with staff standing in doorway. Educated pt that staff must be arms length of pts that are a high fall risk. Pt was not accepting of answer and appeared to be upset. Will notify first shift.

## 2014-09-06 NOTE — Plan of Care (Signed)
Problem: Phase I Progression Outcomes Goal: Hemodynamically stable Outcome: Progressing Pt's vitals remain stable.  Goal: Anginal pain relieved Outcome: Progressing Pt reports absence of chest pain. Goal: Other Phase I Outcomes/Goals Outcome: Not Progressing Pt had fall occur.

## 2014-09-06 NOTE — Progress Notes (Signed)
Notified MD pt had fell while transferring from the bed to the chair. PT states "the chair rolled and I fell on my bottom". Pt states he hit his head but was without injury. Upon assessment pt was found to have no injuries. Vitals remain stable. Pt states he is absent of pain. Patient and family educated. Chair alarm is on, yellow hospital socks, and arm band are on.

## 2014-09-06 NOTE — Progress Notes (Signed)
Pt's IV catheter removed and intact. Pt's IV site clean dry and intact. Discharge instructions and medications reviewed and discussed with patient. Follow up appointments reviewed and discussed with patient. All questions answered and no further questions at this time. Pt in stable condition and in no acute distress at time of discharge. Pt escorted by nurse tech.

## 2014-09-06 NOTE — Care Management Note (Signed)
Case Management Note  Patient Details  Name: Mark Brady MRN: [id number] Date of Birth: Jan 18, 1930  Subjective/Objective:                  Pt admitted from home with CP. Pt lives with his wife and will return home at discharge. Pt is independent with ADl's.  Action/Plan: Pt for discharge home today. No CM needs noted.  Expected Discharge Date:  09/06/14               Expected Discharge Plan:  Home/Self Care  In-House Referral:  NA  Discharge planning Services  CM Consult  Post Acute Care Choice:  NA Choice offered to:  NA  DME Arranged:    DME Agency:     HH Arranged:    HH Agency:     Status of Service:  Completed, signed off  Medicare Important Message Given:    Date Medicare IM Given:    Medicare IM give by:    Date Additional Medicare IM Given:    Additional Medicare Important Message give by:     If discussed at [place] of Stay Meetings, dates discussed:    Additional Comments:  Joylene Draft, RN 09/06/2014, 2:02 PM

## 2014-09-06 NOTE — Discharge Summary (Signed)
Physician Discharge Summary  Mark Brady OAC:[id number] DOB: 10/03/1929 DOA: 09/05/2014  PCP: Mickie Hillier, MD  Admit date: 09/05/2014 Discharge date: 09/06/2014  Time spent: 40 minutes  Recommendations for Outpatient Follow-up:  1. PCP 2 weeks evaluation of gerd  Discharge Diagnoses:  Principal Problem:   Chest pain Active Problems:   Essential hypertension, benign   Hyperlipidemia LDL goal <100   Diabetes   Discharge Condition: stable  Diet recommendation: heart healthy  Filed Weights   09/05/14 0858 09/05/14 1140  Weight: 81.647 kg (180 lb) 81.647 kg (180 lb)    History of present illness:  Mark Brady is a 81 y.o. male who presented to the hospital on 09/05/14 with complaints of chest pain. Patient  a poor historian and therefore details of history somewhat limited. He reported having left-sided chest pain intermittently for the  several months. Reported having episodes approximately 2-3 times a week. Pain described as left-sided chest pain, pressure type that occured while the patient is sitting. This improved when the patient started walking. He may have had some mild associated shortness of breath, no nausea, no vomiting, no diaphoresis, no lightheadedness or dizziness. That morning, his pain began while he was in the kitchen. He had not had any recent fever, cough. He was evaluated in the emergency room and referred for admission for observation.  Hospital Course:  1. Chest pain. Atypical. Cardiac enzymes  Negative x3 and EKG did not show any acute findings x2. Echocardiogram with EF 60% and grade 1 diastolic dysfunction. No events on tele. No further episodes. VSS. Patient reported experiencing reflux last several months. Provided PPI. Recommended follow up with PCP 1-2 weeks.  2. Diabetes. Diet controlled. A1c 6.5. 3. Hypertension. Controlled. . 4. Hyperlipidemia. Patient manages with diet.   Procedures:  Echo 08/2014: Wall thickness was normal. Systolic  function was normal. The estimated ejection fraction was in the range of 60% to 65%. Wall motion was normal; there were no regional wall motion abnormalities. Doppler parameters are consistent with abnormal left ventricular relaxation (grade 1 diastolic dysfunction).  Consultations:  none  Discharge Exam: Filed Vitals:   09/06/14 0430  BP: 127/72  Pulse: 60  Temp: 97.6 F (36.4 C)  Resp: 17    General: well nourished appears comfortable Cardiovascular: rrr no mg/r/ no LE edema Respiratory: normal effort BS clear bilaterally no wheezse  Discharge Instructions   Discharge Instructions    Diet - low sodium heart healthy    Complete by:  As directed      Increase activity slowly    Complete by:  As directed           Current Discharge Medication List    CONTINUE these medications which have NOT CHANGED   Details  Alum & Mag Hydroxide-Simeth (ANTACID ANTI-GAS PO) Take 1 tablet by mouth daily as needed (gas pain).     aspirin 81 MG chewable tablet Chew 81 mg by mouth daily.    clotrimazole-betamethasone (LOTRISONE) cream Apply 1 application topically 2 (two) times daily. Qty: 30 g, Refills: 5    Coconut Oil 1000 MG CAPS Take 1 capsule by mouth daily.    ketoconazole (NIZORAL) 2 % cream Apply 1 application topically 2 (two) times daily. Qty: 30 g, Refills: 3    Misc Natural Products (COLON CARE PO) Take 1 capsule by mouth daily.     !! OVER THE COUNTER MEDICATION Take 2 capsules by mouth daily. Omega Q Plus Softgel capsules    !! OVER THE  COUNTER MEDICATION Take 1 tablet by mouth daily. ProstatD3    Red Yeast Rice Extract 600 MG CAPS Take 1,200 mg by mouth daily.    triamcinolone cream (KENALOG) 0.1 % Apply 1 application topically 2 (two) times daily. Qty: 30 g, Refills: 3    triamterene-hydrochlorothiazide (DYAZIDE) 37.5-25 MG per capsule TAKE 1 CAPSULE BY MOUTH EVERY DAY Qty: 90 capsule, Refills: 1    vitamin C (ASCORBIC ACID) 500 MG tablet Take  1,000 mg by mouth daily.    ACCU-CHEK AVIVA PLUS test strip USE ONE STRIP TO CHECK GLUCOSE ONCE DAILY Qty: 100 each, Refills: 0    ACCU-CHEK SOFTCLIX LANCETS lancets USE ONE LANCET ONCE DAILY Qty: 100 each, Refills: 1     !! - Potential duplicate medications found. Please discuss with provider.     No Known Allergies Follow-up Information    Follow up with Mickie Hillier, MD. Schedule an appointment as soon as possible for a visit in 2 weeks.   Specialty:  Family Medicine   Why:  follow up on gerd.   Contact information:   [address] [place] [address] [phone]        The results of significant diagnostics from this hospitalization (including imaging, microbiology, ancillary and laboratory) are listed below for reference.    Significant Diagnostic Studies: Dg Chest Portable 1 View  09/05/2014   CLINICAL DATA:  Acute onset chest pain. Current history of hypertension, diabetes and hyperlipidemia.  EXAM: PORTABLE CHEST - 1 VIEW  COMPARISON:  06/02/2010 and earlier.  FINDINGS: Cardiac silhouette upper normal in size to slightly enlarged for AP portable technique, unchanged. Prominent paracardiac fat pads. Lungs clear. Bronchovascular markings normal. Pulmonary vascularity normal. No visible pleural effusions. No pneumothorax.  IMPRESSION: No acute cardiopulmonary disease.   Electronically Signed   By: Evangeline Dakin M.D.   On: 09/05/2014 09:34    Microbiology: No results found for this or any previous visit (from the past 240 hour(s)).   Labs: Basic Metabolic Panel:  Recent Labs Lab 09/05/14 0922  NA 138  K 3.8  CL 103  CO2 26  GLUCOSE 165*  BUN 19  CREATININE 1.25*  CALCIUM 9.1   Liver Function Tests:  Recent Labs Lab 09/05/14 0922  AST 18  ALT 15*  ALKPHOS 39  BILITOT 0.7  PROT 7.0  ALBUMIN 4.0   No results for input(s): LIPASE, AMYLASE in the last 168 hours. No results for input(s): AMMONIA in the last 168 hours. CBC:  Recent  Labs Lab 09/05/14 0922  WBC 8.6  HGB 13.8  HCT 41.0  MCV 94.0  PLT 273   Cardiac Enzymes:  Recent Labs Lab 09/05/14 0922 09/05/14 1646 09/05/14 2323 09/06/14 0422  TROPONINI <0.03 <0.03 <0.03 <0.03   BNP: BNP (last 3 results) No results for input(s): BNP in the last 8760 hours.  ProBNP (last 3 results) No results for input(s): PROBNP in the last 8760 hours.  CBG:  Recent Labs Lab 09/05/14 1717 09/05/14 2121 09/06/14 0737 09/06/14 1157  GLUCAP 137* 89 97 92       Signed:  BLACK,KAREN M  [facility] 09/06/2014, 2:02 PM

## 2014-12-01 LAB — HM DIABETES EYE EXAM

## 2014-12-07 ENCOUNTER — Ambulatory Visit (HOSPITAL_COMMUNITY)
Admission: RE | Admit: 2014-12-07 | Discharge: 2014-12-07 | Disposition: A | Discharge status: Home / Self Care | Payer: Medicare Other | Source: Ambulatory Visit | Attending: Ophthalmology | Admitting: Ophthalmology

## 2014-12-07 ENCOUNTER — Encounter (HOSPITAL_COMMUNITY)
Admission: RE | Disposition: A | Discharge status: Home / Self Care | Payer: Self-pay | Source: Ambulatory Visit | Attending: Ophthalmology

## 2014-12-07 ENCOUNTER — Encounter (HOSPITAL_COMMUNITY): Payer: Self-pay

## 2014-12-07 DIAGNOSIS — I10 Essential (primary) hypertension: Secondary | ICD-10-CM | POA: Diagnosis not present

## 2014-12-07 DIAGNOSIS — Z7982 Long term (current) use of aspirin: Secondary | ICD-10-CM | POA: Diagnosis not present

## 2014-12-07 DIAGNOSIS — Z79899 Other long term (current) drug therapy: Secondary | ICD-10-CM | POA: Diagnosis not present

## 2014-12-07 DIAGNOSIS — E119 Type 2 diabetes mellitus without complications: Secondary | ICD-10-CM | POA: Diagnosis not present

## 2014-12-07 DIAGNOSIS — H2640 Unspecified secondary cataract: Secondary | ICD-10-CM | POA: Insufficient documentation

## 2014-12-07 HISTORY — PX: YAG LASER APPLICATION: SHX6189

## 2014-12-07 SURGERY — TREATMENT, USING YAG LASER
Anesthesia: LOCAL | Laterality: Right

## 2014-12-07 MED ORDER — TROPICAMIDE 1 % OP SOLN
OPHTHALMIC | Status: AC
  Filled 2014-12-07: qty 3

## 2014-12-07 MED ORDER — TROPICAMIDE 1 % OP SOLN
1.0000 [drp] | OPHTHALMIC | Status: AC
  Administered 2014-12-07 (×2): 1 [drp] via OPHTHALMIC

## 2014-12-07 NOTE — Op Note (Signed)
Mark Brady T. Gershon Crane, MD  Procedure: Yag Capsulotomy  Yag Laser Self Test Completedyes. Procedure: Posterior Capsulotomy, Eye Protection Worn by Staff yes. Laser In Use Sign on Door yes.  Laser: Nd:YAG Spot Size: Fixed Burst Mode: III Power Setting: 3.4 mJ/burst Number of shots: 21 Total energy delivered: 66.37 mJ   The patient tolerated the procedure without difficulty. No complications were encountered.   The patient was discharged home with the instructions to continue all her current glaucoma medications, if any.   Patient instructed to go to office at 0100 for intraocular pressure check.  Patient verbalizes understanding of discharge instructions Yes.  .    Pre-Operative Diagnosis: After-Cataract, obscuring vision, 366.53 OD Post-Operative Diagnosis: After-Cataract, obscuring vision, 366.53 OD

## 2014-12-07 NOTE — Discharge Instructions (Signed)
Mark Brady  12/07/2014     Instructions    Activity: No Restrictions.   Diet: Resume Diet you were on at home.   Pain Medication: Tylenol if Needed.   CONTACT YOUR DOCTOR IF YOU HAVE PAIN, REDNESS IN YOUR EYE, OR DECREASED VISION.   Follow-up:in the next few days with Rutherford Guys, MD.   Dr. Gershon Crane: [phone]  Dr. Iona Hansen: [phone]  Dr. Geoffry Paradise: [phone]   If you find that you cannot contact your physician, but feel that your signs and   Symptoms warrant a physician's attention, call the Emergency Room at   [phone] ext.[phone].   Othern/a.

## 2014-12-07 NOTE — H&P (Signed)
The patient was re examined and there is no change in the patients condition since the original H and P. 

## 2014-12-09 ENCOUNTER — Encounter (HOSPITAL_COMMUNITY): Payer: Self-pay | Admitting: Ophthalmology

## 2015-01-21 ENCOUNTER — Telehealth: Payer: Self-pay | Admitting: Family Medicine

## 2015-01-21 DIAGNOSIS — E119 Type 2 diabetes mellitus without complications: Secondary | ICD-10-CM

## 2015-01-21 DIAGNOSIS — I10 Essential (primary) hypertension: Secondary | ICD-10-CM

## 2015-01-21 DIAGNOSIS — Z79899 Other long term (current) drug therapy: Secondary | ICD-10-CM

## 2015-01-21 DIAGNOSIS — E785 Hyperlipidemia, unspecified: Secondary | ICD-10-CM

## 2015-01-21 NOTE — Telephone Encounter (Signed)
Lip liv m7 A1c  

## 2015-01-21 NOTE — Telephone Encounter (Signed)
Patient has appointment on 10/17 an would like lab work done.

## 2015-01-21 NOTE — Telephone Encounter (Signed)
Last labs ordered by dr Richardson Landry 09/21/13 lipid, liver and a1c. Had bw done in hospital may 2016

## 2015-01-21 NOTE — Telephone Encounter (Signed)
Called patient and informed him per Dr.Steve Luking -the following labs were ordered : Lipid, Hepatic panel, MET & and A1C. Also informed patient that we now use [facility]. Patient verbalized understanding.

## 2015-01-26 LAB — HEPATIC FUNCTION PANEL
ALBUMIN: 4.5 g/dL (ref 3.5–4.7)
ALK PHOS: 61 IU/L (ref 39–117)
ALT: 15 IU/L (ref 0–44)
AST: 13 IU/L (ref 0–40)
BILIRUBIN TOTAL: 0.5 mg/dL (ref 0.0–1.2)
Bilirubin, Direct: 0.12 mg/dL (ref 0.00–0.40)
Total Protein: 6.9 g/dL (ref 6.0–8.5)

## 2015-01-26 LAB — LIPID PANEL
CHOLESTEROL TOTAL: 269 mg/dL — AB (ref 100–199)
Chol/HDL Ratio: 4.1 ratio units (ref 0.0–5.0)
HDL: 65 mg/dL (ref >39)
LDL CALC: 173 mg/dL — AB (ref 0–99)
TRIGLYCERIDES: 154 mg/dL — AB (ref 0–149)
VLDL Cholesterol Cal: 31 mg/dL (ref 5–40)

## 2015-01-26 LAB — BASIC METABOLIC PANEL
BUN / CREAT RATIO: 13 (ref 10–22)
BUN: 17 mg/dL (ref 8–27)
CHLORIDE: 97 mmol/L (ref 97–108)
CO2: 26 mmol/L (ref 18–29)
Calcium: 9.6 mg/dL (ref 8.6–10.2)
Creatinine, Ser: 1.27 mg/dL (ref 0.76–1.27)
GFR, EST AFRICAN AMERICAN: 60 mL/min/{1.73_m2} (ref >59)
GFR, EST NON AFRICAN AMERICAN: 52 mL/min/{1.73_m2} — AB (ref >59)
Glucose: 121 mg/dL — ABNORMAL HIGH (ref 65–99)
POTASSIUM: 4.7 mmol/L (ref 3.5–5.2)
SODIUM: 139 mmol/L (ref 134–144)

## 2015-01-26 LAB — HEMOGLOBIN A1C
Est. average glucose Bld gHb Est-mCnc: 143 mg/dL
Hgb A1c MFr Bld: 6.6 % — ABNORMAL HIGH (ref 4.8–5.6)

## 2015-01-31 ENCOUNTER — Ambulatory Visit (INDEPENDENT_AMBULATORY_CARE_PROVIDER_SITE_OTHER): Payer: Medicare Other | Admitting: Family Medicine

## 2015-01-31 ENCOUNTER — Encounter: Payer: Self-pay | Admitting: Family Medicine

## 2015-01-31 VITALS — BP 148/94 | Ht 67.0 in | Wt 173.0 lb

## 2015-01-31 DIAGNOSIS — I10 Essential (primary) hypertension: Secondary | ICD-10-CM

## 2015-01-31 DIAGNOSIS — E785 Hyperlipidemia, unspecified: Secondary | ICD-10-CM | POA: Diagnosis not present

## 2015-01-31 DIAGNOSIS — E119 Type 2 diabetes mellitus without complications: Secondary | ICD-10-CM | POA: Diagnosis not present

## 2015-01-31 NOTE — Progress Notes (Signed)
Subjective:    Patient ID: Mark Brady, male    DOB: October 14, 1929, 81 y.o.   MRN: [id number]  Diabetes He presents for his follow-up diabetic visit. He has type 2 diabetes mellitus. He is following a diabetic diet. Exercise: walking for exercise daily. His breakfast blood glucose range is generally 110-130 mg/dl. He sees a podiatrist.Eye exam is current.  A1C done on bw 6.6.   Patient compliant with blood pressure medicine. Meds reviewed. No side effects. Takes faithfully. Blood pressure is generally good when checked elsewhere.  Working on fats in diet. However he did stop his red yeast rice extract. No obvious side effects just wanted to get off it.     Glu's generlly good between 90 and 112 No concerns or problems today.   Results for orders placed or performed in visit on 01/31/15  HM DIABETES EYE EXAM  Result Value Ref Range   HM Diabetic Eye Exam No Retinopathy No Retinopathy   Declines flu shot  Had minor sur g following cataract surg  Review of Systems No headache no chest pain no back pain no abdominal pain no new neurological symptoms with history of stroke    Objective:   Physical Exam  Alert vitals stable HEENT normal. Lungs clear. Heart regular in rhythm. Neuro exam intact. Ankles trace edema  Blood work reviewed.labs  Recent Results (from the past 2160 hour(s))  HM DIABETES EYE EXAM     Status: None   Collection Time: 12/01/14 12:00 AM  Result Value Ref Range   HM Diabetic Eye Exam No Retinopathy No Retinopathy  Lipid panel     Status: Abnormal   Collection Time: 01/25/15  8:07 AM  Result Value Ref Range   Cholesterol, Total 269 (H) 100 - 199 mg/dL   Triglycerides 154 (H) 0 - 149 mg/dL   HDL 65 >39 mg/dL    Comment: According to ATP-III Guidelines, HDL-C >59 mg/dL is considered a negative risk factor for CHD.    VLDL Cholesterol Cal 31 5 - 40 mg/dL   LDL Calculated 173 (H) 0 - 99 mg/dL   Chol/HDL Ratio 4.1 0.0 - 5.0 ratio units    Comment:                                    T. Chol/HDL Ratio                                             Men  Women                               1/2 Avg.Risk  3.4    3.3                                   Avg.Risk  5.0    4.4                                2X Avg.Risk  9.6    7.1  3X Avg.Risk 23.4   11.0   Hepatic function panel     Status: None   Collection Time: 01/25/15  8:07 AM  Result Value Ref Range   Total Protein 6.9 6.0 - 8.5 g/dL   Albumin 4.5 3.5 - 4.7 g/dL   Bilirubin Total 0.5 0.0 - 1.2 mg/dL   Bilirubin, Direct 0.12 0.00 - 0.40 mg/dL   Alkaline Phosphatase 61 39 - 117 IU/L   AST 13 0 - 40 IU/L   ALT 15 0 - 44 IU/L  Basic metabolic panel     Status: Abnormal   Collection Time: 01/25/15  8:07 AM  Result Value Ref Range   Glucose 121 (H) 65 - 99 mg/dL   BUN 17 8 - 27 mg/dL   Creatinine, Ser 1.27 0.76 - 1.27 mg/dL   GFR calc non Af Amer 52 (L) >59 mL/min/1.73   GFR calc Af Amer 60 >59 mL/min/1.73   BUN/Creatinine Ratio 13 10 - 22   Sodium 139 134 - 144 mmol/L    Comment: **Effective January 31, 2015 the reference interval**   for Sodium, Serum will be changing to:                                             136 - 144    Potassium 4.7 3.5 - 5.2 mmol/L    Comment: **Effective January 31, 2015 the reference interval**   for Potassium, Serum will be changing to:                          0 -  7 days        3.7 - 5.2                          8 - 30 days        3.7 - 6.4                          1 -  6 months      3.8 - 6.0                   7 months -  1 year        3.8 - 5.3                              >1 year        3.5 - 5.2    Chloride 97 97 - 108 mmol/L    Comment: **Effective January 31, 2015 the reference interval**   for Chloride, Serum will be changing to:                                              97 - 106    CO2 26 18 - 29 mmol/L   Calcium 9.6 8.6 - 10.2 mg/dL  Hemoglobin A1c     Status: Abnormal   Collection Time: 01/25/15  8:07 AM    Result Value Ref Range   Hgb A1c MFr Bld 6.6 (H) 4.8 - 5.6 %    Comment:  Pre-diabetes: 5.7 - 6.4          Diabetes: >6.4          Glycemic control for adults with diabetes: <7.0    Est. average glucose Bld gHb Est-mCnc 143 mg/dL      Assessment & Plan:  Impression 1 type 2 diabetes good control discussed #2 hypertension good control discussed #3 status post stroke neurologically stable #4 hyperlipidemia worsening discuss at length plan patient to resume red yeast rice extract. Work on diet exercise. Declines flu shot. Eye exam recently. Follow-up in 6 months. WSL

## 2015-02-03 ENCOUNTER — Other Ambulatory Visit: Payer: Self-pay | Admitting: Family Medicine

## 2015-03-15 LAB — HM DIABETES EYE EXAM

## 2015-03-28 ENCOUNTER — Encounter: Payer: Self-pay | Admitting: *Deleted

## 2015-04-03 ENCOUNTER — Encounter (HOSPITAL_COMMUNITY): Payer: Self-pay | Admitting: Emergency Medicine

## 2015-04-03 ENCOUNTER — Emergency Department (HOSPITAL_COMMUNITY): Payer: Medicare Other

## 2015-04-03 ENCOUNTER — Emergency Department (HOSPITAL_COMMUNITY)
Admission: EM | Admit: 2015-04-03 | Discharge: 2015-04-03 | Disposition: A | Discharge status: Home / Self Care | Payer: Medicare Other | Attending: Emergency Medicine | Admitting: Emergency Medicine

## 2015-04-03 DIAGNOSIS — R42 Dizziness and giddiness: Secondary | ICD-10-CM | POA: Insufficient documentation

## 2015-04-03 DIAGNOSIS — Z79899 Other long term (current) drug therapy: Secondary | ICD-10-CM | POA: Diagnosis not present

## 2015-04-03 DIAGNOSIS — M1990 Unspecified osteoarthritis, unspecified site: Secondary | ICD-10-CM | POA: Insufficient documentation

## 2015-04-03 DIAGNOSIS — R05 Cough: Secondary | ICD-10-CM | POA: Insufficient documentation

## 2015-04-03 DIAGNOSIS — E119 Type 2 diabetes mellitus without complications: Secondary | ICD-10-CM | POA: Insufficient documentation

## 2015-04-03 DIAGNOSIS — G479 Sleep disorder, unspecified: Secondary | ICD-10-CM | POA: Insufficient documentation

## 2015-04-03 DIAGNOSIS — Z8673 Personal history of transient ischemic attack (TIA), and cerebral infarction without residual deficits: Secondary | ICD-10-CM | POA: Insufficient documentation

## 2015-04-03 DIAGNOSIS — I10 Essential (primary) hypertension: Secondary | ICD-10-CM | POA: Diagnosis not present

## 2015-04-03 DIAGNOSIS — Z7982 Long term (current) use of aspirin: Secondary | ICD-10-CM | POA: Insufficient documentation

## 2015-04-03 LAB — COMPREHENSIVE METABOLIC PANEL
ALK PHOS: 50 U/L (ref 38–126)
ALT: 16 U/L — AB (ref 17–63)
AST: 18 U/L (ref 15–41)
Albumin: 4.3 g/dL (ref 3.5–5.0)
Anion gap: 9 (ref 5–15)
BILIRUBIN TOTAL: 0.6 mg/dL (ref 0.3–1.2)
BUN: 21 mg/dL — AB (ref 6–20)
CALCIUM: 9.4 mg/dL (ref 8.9–10.3)
CHLORIDE: 102 mmol/L (ref 101–111)
CO2: 26 mmol/L (ref 22–32)
CREATININE: 1.14 mg/dL (ref 0.61–1.24)
GFR calc Af Amer: >60 mL/min (ref >60)
GFR, EST NON AFRICAN AMERICAN: 58 mL/min — AB (ref >60)
Glucose, Bld: 103 mg/dL — ABNORMAL HIGH (ref 65–99)
Potassium: 3.7 mmol/L (ref 3.5–5.1)
Sodium: 137 mmol/L (ref 135–145)
Total Protein: 7.4 g/dL (ref 6.5–8.1)

## 2015-04-03 LAB — CBC WITH DIFFERENTIAL/PLATELET
BASOS ABS: 0 10*3/uL (ref 0.0–0.1)
Basophils Relative: 0 %
Eosinophils Absolute: 0.2 10*3/uL (ref 0.0–0.7)
Eosinophils Relative: 2 %
HEMATOCRIT: 40.9 % (ref 39.0–52.0)
HEMOGLOBIN: 14.7 g/dL (ref 13.0–17.0)
LYMPHS ABS: 1.7 10*3/uL (ref 0.7–4.0)
LYMPHS PCT: 17 %
MCH: 32.6 pg (ref 26.0–34.0)
MCHC: 35.9 g/dL (ref 30.0–36.0)
MCV: 90.7 fL (ref 78.0–100.0)
Monocytes Absolute: 0.8 10*3/uL (ref 0.1–1.0)
Monocytes Relative: 8 %
NEUTROS ABS: 7.5 10*3/uL (ref 1.7–7.7)
NEUTROS PCT: 73 %
Platelets: 278 10*3/uL (ref 150–400)
RBC: 4.51 MIL/uL (ref 4.22–5.81)
RDW: 12.6 % (ref 11.5–15.5)
WBC: 10.2 10*3/uL (ref 4.0–10.5)

## 2015-04-03 LAB — URINALYSIS, ROUTINE W REFLEX MICROSCOPIC
BILIRUBIN URINE: NEGATIVE
Glucose, UA: NEGATIVE mg/dL
HGB URINE DIPSTICK: NEGATIVE
Ketones, ur: NEGATIVE mg/dL
Leukocytes, UA: NEGATIVE
Nitrite: NEGATIVE
Protein, ur: NEGATIVE mg/dL
SPECIFIC GRAVITY, URINE: 1.01 (ref 1.005–1.030)
pH: 6 (ref 5.0–8.0)

## 2015-04-03 NOTE — ED Provider Notes (Signed)
CSN: [id number]     Arrival date & time 04/03/15  1058 History  By signing my name below, I, Altamease Oiler, attest that this documentation has been prepared under the direction and in the presence of Ripley Fraise, MD. Electronically Signed: Altamease Oiler, ED Scribe. 04/03/2015. 12:30 PM  Chief Complaint  Patient presents with  . Dizziness   Patient is a 81 y.o. male presenting with dizziness. The history is provided by the patient and the spouse. No language interpreter was used.  Dizziness Quality:  Lightheadedness Severity:  Moderate Onset quality:  Sudden Timing:  Intermittent Progression:  Resolved Chronicity:  Recurrent Context: not with loss of consciousness   Relieved by:  Nothing Worsened by:  Nothing Ineffective treatments:  None tried Associated symptoms: no chest pain, no headaches, no hearing loss, no nausea, no shortness of breath, no syncope, no vision changes, no vomiting and no weakness   Risk factors: hx of stroke   Risk factors: no heart disease and no new medications    Sophie L Simonian is a 81 y.o. male with PMHx of HTN, DM, HLD, and stroke who presents to the Emergency Department complaining of intermittent lightheaded dizziness with onset this morning. He is not dizzy at present. His wife measured his blood pressure at home where it was noted to be elevated. He and his wife note that he has had similar episodes in the past when his blood pressure was elevated. The patient's wife states that he has not been sleeping well for the last several days. Associated symptoms include intermittent burning/tingling in the left lower leg for 1 month that is worse at night. Pt denies headache, vision change, change in hearing, fever, abdominal pain, vomiting, chest pain, SOB, back pain, extremity weakness, and numbness in the face. The patient's wife denies recent medication change. He has not fallen recently.   Past Medical History  Diagnosis Date  . Hypertension   .  Arthritis   . History of elevated PSA   . Hyperlipidemia   . Stroke ([place])   . Diabetes mellitus without complication ([place])     diet controlled   Past Surgical History  Procedure Laterality Date  . Colonoscopy    . Cataract extraction w/phaco Right 01/26/2014    Procedure: CATARACT EXTRACTION PHACO AND INTRAOCULAR LENS PLACEMENT (IOC);  Surgeon: Elta Guadeloupe T. Gershon Crane, MD;  Location: [facility];  Service: Ophthalmology;  Laterality: Right;  CDE 6.53  . Cataract extraction w/phaco Left 02/09/2014    Procedure: CATARACT EXTRACTION PHACO AND INTRAOCULAR LENS PLACEMENT (IOC);  Surgeon: Elta Guadeloupe T. Gershon Crane, MD;  Location: [facility];  Service: Ophthalmology;  Laterality: Left;  CDE:5.58  . Yag laser application Right [id number]    Procedure: YAG LASER APPLICATION;  Surgeon: Rutherford Guys, MD;  Location: [facility];  Service: Ophthalmology;  Laterality: Right;   Family History  Problem Relation Age of Onset  . Arthritis     Social History  Substance Use Topics  . Smoking status: Never Smoker   . Smokeless tobacco: Never Used  . Alcohol Use: No    Review of Systems  Constitutional: Negative for fever.  HENT: Negative for hearing loss.   Eyes: Negative for visual disturbance.  Respiratory: Positive for cough. Negative for shortness of breath.   Cardiovascular: Negative for chest pain and syncope.  Gastrointestinal: Negative for nausea, vomiting and abdominal pain.  Musculoskeletal: Negative for back pain.  Neurological: Positive for dizziness and light-headedness. Negative for weakness and headaches.       LLE burning/tingling  Psychiatric/Behavioral: Positive for sleep disturbance.  All other systems reviewed and are negative.  Allergies  Review of patient's allergies indicates no known allergies.  Home Medications   Prior to Admission medications   Medication Sig Start Date End Date Taking? Authorizing Provider  ACCU-CHEK AVIVA PLUS test strip USE ONE STRIP TO CHECK GLUCOSE ONCE DAILY 08/13/14    Mikey Kirschner, MD  ACCU-CHEK SOFTCLIX LANCETS lancets USE ONE LANCET ONCE DAILY 08/13/14   Mikey Kirschner, MD  Alum & Mag Hydroxide-Simeth (ANTACID ANTI-GAS PO) Take 1 tablet by mouth daily as needed (gas pain).     Historical Provider, MD  aspirin 81 MG chewable tablet Chew 81 mg by mouth daily.    Historical Provider, MD  Coconut Oil 1000 MG CAPS Take 1 capsule by mouth daily.    Historical Provider, MD  OVER THE COUNTER MEDICATION Pronitrix supplement 2 tabs daily Ultimate BP supplement 2 tabs daily Meristem Superfood supplement 20 drops bid    Historical Provider, MD  triamterene-hydrochlorothiazide (DYAZIDE) 37.5-25 MG capsule TAKE 1 CAPSULE BY MOUTH EVERY DAY 02/04/15   Mikey Kirschner, MD  vitamin C (ASCORBIC ACID) 500 MG tablet Take 1,000 mg by mouth daily.    Historical Provider, MD   BP 172/79 mmHg  Pulse 84  Temp(Src) 97.9 F (36.6 C) (Oral)  Resp 18  Ht 5\' 7"  (1.702 m)  Wt 165 lb (74.844 kg)  BMI 25.84 kg/m2  SpO2 100% Physical Exam CONSTITUTIONAL: Well developed/well nourished HEAD: Normocephalic/atraumatic EYES: Right eye is surgically corrected, no nystagmus ENMT: Mucous membranes moist, bilateral TMs clear NECK: supple no meningeal signs, no bruits CV: S1/S2 noted, no murmurs/rubs/gallops noted LUNGS: Lungs are clear to auscultation bilaterally, no apparent distress ABDOMEN: soft, nontender, no rebound or guarding GU:no cva tenderness NEURO:Awake/alert, face symmetric, no arm or leg drift is noted Equal 5/5 strength with shoulder abduction, elbow flex/extension, wrist flex/extension in upper extremities and equal hand grips bilaterally Equal 5/5 strength with hip flexion,knee flex/extension, foot dorsi/plantar flexion Cranial nerves 3/4/5/6/10/22/08/11/12 tested and intact Gait normal without ataxia No past pointing Sensation to light touch intact in all extremities EXTREMITIES: pulses normal/equalx4, full ROM SKIN: warm, color normal PSYCH: no abnormalities  of mood noted  ED Course  Procedures  DIAGNOSTIC STUDIES: Oxygen Saturation is 100% on RA,  normal by my interpretation.    COORDINATION OF CARE: 12:15 PM Discussed treatment plan which includes lab work, EKG, and CT head with pt at bedside and pt agreed to plan.  Labs Review Labs Reviewed  COMPREHENSIVE METABOLIC PANEL - Abnormal; Notable for the following:    Glucose, Bld 103 (*)    BUN 21 (*)    ALT 16 (*)    GFR calc non Af Amer 58 (*)    All other components within normal limits  URINALYSIS, ROUTINE W REFLEX MICROSCOPIC (NOT AT [facility]) - Abnormal; Notable for the following:    Color, Urine STRAW (*)    All other components within normal limits  CBC WITH DIFFERENTIAL/PLATELET    Imaging Review Ct Head Wo Contrast  04/03/2015  CLINICAL DATA:  Dizziness and hypertension EXAM: CT HEAD WITHOUT CONTRAST TECHNIQUE: Contiguous axial images were obtained from the base of the skull through the vertex without intravenous contrast. COMPARISON:  06/02/2010 FINDINGS: Bony calvarium is intact. Mild atrophic changes are seen. Scattered areas of decreased attenuation are noted within the deep white matter bilaterally consistent with chronic ischemic change. Prior lacunar infarct is noted in the region of the basal ganglia on  the left. No findings to suggest acute hemorrhage, acute infarct or space-occupying mass lesion is noted. IMPRESSION: Chronic ischemic change without acute abnormality. Electronically Signed   By: Inez Catalina M.D.   On: 04/03/2015 14:34   I have personally reviewed and evaluated these images and lab results as part of my medical decision-making.   EKG Interpretation   Date/Time:  Sunday April 03 2015 11:15:56 EST Ventricular Rate:  82 PR Interval:  188 QRS Duration: 103 QT Interval:  392 QTC Calculation: 458 R Axis:   -37 Text Interpretation:  Sinus rhythm Atrial premature complexes Left axis  deviation Low voltage, precordial leads RSR' in V1 or V2, probably  normal  variant No significant change was found Confirmed by Wyvonnia Dusky  MD, STEPHEN  [phone]) on 04/03/2015 11:40:46 AM     Pt well appearing He is at baseline No focal weakness No ataxia All symptoms resolved Similar to prior episodes when BP was elevated I don't feel this represents acute CVA Left leg tingling sounds more likely neuropathy (only to left lower tibial surface, reports burning pain at night) Advised need for close PCP evaluation We discussed strict ER return precautions  MDM   Final diagnoses:  Dizziness    Nursing notes including past medical history and social history reviewed and considered in documentation Labs/vital reviewed myself and considered during evaluation   I personally performed the services described in this documentation, which was scribed in my presence. The recorded information has been reviewed and is accurate.       Ripley Fraise, MD 04/03/15 [phone]

## 2015-04-03 NOTE — ED Notes (Signed)
Patient c/o dizziness and left foot tingling. Per patient intermittent x2 weeks. Patient denies any slurred speech, facial drooping, difficulty with words, or confusion. Patient does have hx of TIAs. Per patient room is not spinning "he just feels a little dizzy and mostly while laying down."

## 2015-04-03 NOTE — Discharge Instructions (Signed)
°  Do not drive or participate in potentially dangerous activities requiring balance unless off meds (not drowsy) and the vertigo has resolved. Most of the time benign vertigo is much better after a few days. However, mild unsteadiness may last for up to 3 months in some patients. An MRI scan or other special tests to evaluate your hearing and balance may be needed if the vertigo does not improve or returns in the future.   RETURN IMMEDIATELY IF YOU HAVE ANY OF THE FOLLOWING (call 911): Increasing vertigo, earache, ear drainage, or loss of hearing.  Severe headache, blurred or double vision, or trouble walking.  Fainting or poorly responsive, extreme weakness, chest pain, or palpitations.  Fever, persistent vomiting, or dehydration.  Numbness, tingling, incoordination, or weakness of the limbs.  Change in speech, vision, swallowing, understanding, or other concerns.  

## 2015-04-05 ENCOUNTER — Ambulatory Visit (INDEPENDENT_AMBULATORY_CARE_PROVIDER_SITE_OTHER): Payer: Medicare Other | Admitting: Family Medicine

## 2015-04-05 ENCOUNTER — Encounter: Payer: Self-pay | Admitting: Family Medicine

## 2015-04-05 VITALS — BP 140/84 | Temp 98.4°F | Ht 67.0 in | Wt 172.0 lb

## 2015-04-05 DIAGNOSIS — G5792 Unspecified mononeuropathy of left lower limb: Secondary | ICD-10-CM

## 2015-04-05 DIAGNOSIS — I10 Essential (primary) hypertension: Secondary | ICD-10-CM

## 2015-04-05 DIAGNOSIS — M792 Neuralgia and neuritis, unspecified: Secondary | ICD-10-CM

## 2015-04-05 MED ORDER — LOSARTAN POTASSIUM 25 MG PO TABS: ORAL_TABLET | ORAL | Status: DC

## 2015-04-05 NOTE — Progress Notes (Signed)
   Subjective:    Patient ID: Mark Brady, male    DOB: August 03, 1929, 81 y.o.   MRN: [id number]  HPIWent to ER on 12/18. Was lightheaded and having tingling in left leg. bp was elevated.   Patient has been compliant with diuretics. Blood pressure meds have been adjusted in the past. Patient feels his blood pressures running too high now. Gets headaches at times.  Has leg discomfort sharp in nature burning tingling. Sharp pains running down to ankle and foot lancinating in nature. No weakness in leg or foot  Pt states he is feeling better.   Has a deep cough from time to time. Going on for a long long time negative chest x-ray this summer no hemoptysis no shortness of breath    Review of Systems No current headache no chest pain no abdominal pain no change in bowel habits    Objective:   Physical Exam Alert vitals stable. Lungs clear. Heart rare rhythm HET normal left leg strength intact reflexes intact sensation currently intact blood pressure elevated on repeat 136/92   ER note reviewed at length    Assessment & Plan:  Impression 1 left leg neuropathic pain unrelated to #2 #3 #2 elevated blood pressure suboptimum control current meds #3 recent workup in the ER for dizziness ER doctor was questioning stroke, highly doubt this discussed plan add Cozaar for blood pressure control. Pain control discussed for left leg exercise encourage warning signs discussed WSL

## 2015-06-06 ENCOUNTER — Ambulatory Visit: Payer: Self-pay | Admitting: Family Medicine

## 2015-06-13 ENCOUNTER — Encounter: Payer: Self-pay | Admitting: Family Medicine

## 2015-06-13 ENCOUNTER — Ambulatory Visit (INDEPENDENT_AMBULATORY_CARE_PROVIDER_SITE_OTHER): Payer: Medicare Other | Admitting: Family Medicine

## 2015-06-13 VITALS — BP 136/80 | Ht 67.0 in | Wt 177.2 lb

## 2015-06-13 DIAGNOSIS — I10 Essential (primary) hypertension: Secondary | ICD-10-CM | POA: Diagnosis not present

## 2015-06-13 MED ORDER — LOSARTAN POTASSIUM 25 MG PO TABS: ORAL_TABLET | ORAL | Status: DC

## 2015-06-13 NOTE — Progress Notes (Signed)
   Subjective:    Patient ID: Mark Brady, male    DOB: September 17, 1929, 82 y.o.   MRN: [id number]  Hypertension This is a chronic problem. The current episode started more than 1 year ago. There are no compliance problems.    Patient in today for a two months follow up for hypertension.   BP numbers at thome much better overall  Patient compliant with the Cozaar. Handling it well. No obvious side effects. Notes improved blood pressures at home.  States no other concerns this visit.  Exercising regularly Review of Systems No headache no chest pain no abdominal pain no change in bowel habits ROS otherwise negative    Objective:   Physical Exam  Alert vital stable blood pressure on repeat 130/78. Lungs clear heart regular rhythm ankles without edema      Assessment & Plan:  Impression hypertension much improved control plan compliance discussed patient maintain same medications recheck in several months WSL

## 2015-08-01 ENCOUNTER — Ambulatory Visit: Payer: Medicare Other | Admitting: Family Medicine

## 2015-08-21 ENCOUNTER — Other Ambulatory Visit: Payer: Self-pay | Admitting: Family Medicine

## 2015-10-11 ENCOUNTER — Encounter: Payer: Self-pay | Admitting: Family Medicine

## 2015-10-11 ENCOUNTER — Ambulatory Visit (INDEPENDENT_AMBULATORY_CARE_PROVIDER_SITE_OTHER): Payer: Medicare Other | Admitting: Family Medicine

## 2015-10-11 VITALS — BP 122/78 | Ht 67.0 in | Wt 174.0 lb

## 2015-10-11 DIAGNOSIS — I10 Essential (primary) hypertension: Secondary | ICD-10-CM

## 2015-10-11 DIAGNOSIS — E119 Type 2 diabetes mellitus without complications: Secondary | ICD-10-CM | POA: Diagnosis not present

## 2015-10-11 DIAGNOSIS — E785 Hyperlipidemia, unspecified: Secondary | ICD-10-CM | POA: Diagnosis not present

## 2015-10-11 LAB — POCT GLYCOSYLATED HEMOGLOBIN (HGB A1C): HEMOGLOBIN A1C: 6.4

## 2015-10-11 MED ORDER — PRAVASTATIN SODIUM 40 MG PO TABS: 40.0000 mg | ORAL_TABLET | Freq: Every day | ORAL | Status: DC

## 2015-10-11 MED ORDER — GLUCOSE BLOOD VI STRP: ORAL_STRIP | Status: DC

## 2015-10-11 NOTE — Progress Notes (Signed)
Subjective:    Patient ID: Mark Brady, male    DOB: 1930/01/23, 82 y.o.   MRN: [id number] Patient arrives office with numerous concerns Hypertension This is a chronic problem. Risk factors for coronary artery disease include male gender and dyslipidemia. Treatments tried: losartan, triam/hctz. There are no compliance problems.   Compliant with blood pressure medication. No obvious side effects.  Watching diet closely as far as the diabetes. Most fasting sugars running good in the low 100s. Numbers are reviewed. Results for orders placed or performed during the hospital encounter of 04/03/15  CBC with Differential  Result Value Ref Range   WBC 10.2 4.0 - 10.5 K/uL   RBC 4.51 4.22 - 5.81 MIL/uL   Hemoglobin 14.7 13.0 - 17.0 g/dL   HCT 40.9 39.0 - 52.0 %   MCV 90.7 78.0 - 100.0 fL   MCH 32.6 26.0 - 34.0 pg   MCHC 35.9 30.0 - 36.0 g/dL   RDW 12.6 11.5 - 15.5 %   Platelets 278 150 - 400 K/uL   Neutrophils Relative % 73 %   Neutro Abs 7.5 1.7 - 7.7 K/uL   Lymphocytes Relative 17 %   Lymphs Abs 1.7 0.7 - 4.0 K/uL   Monocytes Relative 8 %   Monocytes Absolute 0.8 0.1 - 1.0 K/uL   Eosinophils Relative 2 %   Eosinophils Absolute 0.2 0.0 - 0.7 K/uL   Basophils Relative 0 %   Basophils Absolute 0.0 0.0 - 0.1 K/uL  Comprehensive metabolic panel  Result Value Ref Range   Sodium 137 135 - 145 mmol/L   Potassium 3.7 3.5 - 5.1 mmol/L   Chloride 102 101 - 111 mmol/L   CO2 26 22 - 32 mmol/L   Glucose, Bld 103 (H) 65 - 99 mg/dL   BUN 21 (H) 6 - 20 mg/dL   Creatinine, Ser 1.14 0.61 - 1.24 mg/dL   Calcium 9.4 8.9 - 10.3 mg/dL   Total Protein 7.4 6.5 - 8.1 g/dL   Albumin 4.3 3.5 - 5.0 g/dL   AST 18 15 - 41 U/L   ALT 16 (L) 17 - 63 U/L   Alkaline Phosphatase 50 38 - 126 U/L   Total Bilirubin 0.6 0.3 - 1.2 mg/dL   GFR calc non Af Amer 58 (L) >60 mL/min   GFR calc Af Amer >60 >60 mL/min   Anion gap 9 5 - 15  Urinalysis, Routine w reflex microscopic (not at [facility])  Result Value Ref  Range   Color, Urine STRAW (A) YELLOW   APPearance CLEAR CLEAR   Specific Gravity, Urine 1.010 1.005 - 1.030   pH 6.0 5.0 - 8.0   Glucose, UA NEGATIVE NEGATIVE mg/dL   Hgb urine dipstick NEGATIVE NEGATIVE   Bilirubin Urine NEGATIVE NEGATIVE   Ketones, ur NEGATIVE NEGATIVE mg/dL   Protein, ur NEGATIVE NEGATIVE mg/dL   Nitrite NEGATIVE NEGATIVE   Leukocytes, UA NEGATIVE NEGATIVE   Results for orders placed or performed in visit on 10/11/15  POCT glycosylated hemoglobin (Hb A1C)  Result Value Ref Range   Hemoglobin A1C 6.4     History of elevated cholesterol. Patient has always been afraid to start a statin agent. History of stroke also. Last LDL was just too high  Review of Systems No headache, no major weight loss or weight gain, no chest pain no back pain abdominal pain no change in bowel habits complete ROS otherwise negative     Objective:   Physical Exam Alert vitals stable. HEENT normal.  Lungs clear. Heart regular rate and rhythm. No focal neurological deficits ankles without edema       Assessment & Plan:  Impression 1 type 2 diabetes good control discussed A1c good no need to check initiate medicines #2 hypertension good control on repeat discussed maintain same meds compliance discussed #3 hyperlipidemia very concerning in terms of #4. Long discussion held encouraging trial of medications #4 history of stroke with good resolution plan patient agrees will initiate Pravachol 40 mg daily at bedtime. Maintain all other medications. Diet exercise discussed. Recheck as scheduled. Blood work one week before then [employer]

## 2015-11-18 ENCOUNTER — Other Ambulatory Visit: Payer: Self-pay | Admitting: Family Medicine

## 2015-12-10 ENCOUNTER — Other Ambulatory Visit: Payer: Self-pay | Admitting: Family Medicine

## 2015-12-12 DIAGNOSIS — H11001 Unspecified pterygium of right eye: Secondary | ICD-10-CM | POA: Diagnosis not present

## 2015-12-12 DIAGNOSIS — H5202 Hypermetropia, left eye: Secondary | ICD-10-CM | POA: Diagnosis not present

## 2015-12-12 DIAGNOSIS — E119 Type 2 diabetes mellitus without complications: Secondary | ICD-10-CM | POA: Diagnosis not present

## 2015-12-12 DIAGNOSIS — H52223 Regular astigmatism, bilateral: Secondary | ICD-10-CM | POA: Diagnosis not present

## 2015-12-12 LAB — HM DIABETES EYE EXAM

## 2015-12-21 ENCOUNTER — Encounter: Payer: Self-pay | Admitting: *Deleted

## 2016-01-31 DIAGNOSIS — E785 Hyperlipidemia, unspecified: Secondary | ICD-10-CM | POA: Diagnosis not present

## 2016-01-31 DIAGNOSIS — E119 Type 2 diabetes mellitus without complications: Secondary | ICD-10-CM | POA: Diagnosis not present

## 2016-01-31 LAB — MICROALBUMIN, URINE WAIVED
Creatinine, Urine Waived: 100 mg/dL (ref 10–300)
MICROALB, UR WAIVED: 10 mg/L (ref 0–19)
Microalb/Creat Ratio: <30 mg/g (ref <30)

## 2016-01-31 LAB — LIPID PANEL
CHOLESTEROL TOTAL: 214 mg/dL — AB (ref 100–199)
Chol/HDL Ratio: 2.4 ratio units (ref 0.0–5.0)
HDL: 88 mg/dL (ref >39)
LDL Calculated: 107 mg/dL — ABNORMAL HIGH (ref 0–99)
TRIGLYCERIDES: 95 mg/dL (ref 0–149)
VLDL CHOLESTEROL CAL: 19 mg/dL (ref 5–40)

## 2016-01-31 LAB — HEPATIC FUNCTION PANEL
ALK PHOS: 53 IU/L (ref 39–117)
ALT: 26 IU/L (ref 0–44)
AST: 17 IU/L (ref 0–40)
Albumin: 4.4 g/dL (ref 3.5–4.7)
Bilirubin Total: 0.5 mg/dL (ref 0.0–1.2)
Total Protein: 7.3 g/dL (ref 6.0–8.5)

## 2016-01-31 LAB — BASIC METABOLIC PANEL
BUN / CREAT RATIO: 15 (ref 10–24)
BUN: 17 mg/dL (ref 8–27)
CO2: 31 mmol/L — AB (ref 18–29)
CREATININE: 1.12 mg/dL (ref 0.76–1.27)
Calcium: 9.8 mg/dL (ref 8.6–10.2)
Chloride: 98 mmol/L (ref 96–106)
GFR calc Af Amer: 69 mL/min/{1.73_m2} (ref >59)
GFR calc non Af Amer: 60 mL/min/{1.73_m2} (ref >59)
GLUCOSE: 115 mg/dL — AB (ref 65–99)
POTASSIUM: 4.1 mmol/L (ref 3.5–5.2)
SODIUM: 137 mmol/L (ref 134–144)

## 2016-02-03 ENCOUNTER — Telehealth: Payer: Self-pay | Admitting: Family Medicine

## 2016-02-03 NOTE — Telephone Encounter (Signed)
Review blood work results from [facility]. °

## 2016-02-07 ENCOUNTER — Ambulatory Visit (INDEPENDENT_AMBULATORY_CARE_PROVIDER_SITE_OTHER): Payer: Medicare Other | Admitting: Family Medicine

## 2016-02-07 ENCOUNTER — Encounter: Payer: Self-pay | Admitting: Family Medicine

## 2016-02-07 VITALS — BP 132/74 | Ht 66.5 in | Wt 171.0 lb

## 2016-02-07 DIAGNOSIS — Z23 Encounter for immunization: Secondary | ICD-10-CM

## 2016-02-07 DIAGNOSIS — Z0000 Encounter for general adult medical examination without abnormal findings: Secondary | ICD-10-CM

## 2016-02-07 DIAGNOSIS — E785 Hyperlipidemia, unspecified: Secondary | ICD-10-CM

## 2016-02-07 DIAGNOSIS — I10 Essential (primary) hypertension: Secondary | ICD-10-CM

## 2016-02-07 DIAGNOSIS — E119 Type 2 diabetes mellitus without complications: Secondary | ICD-10-CM

## 2016-02-07 LAB — POCT GLYCOSYLATED HEMOGLOBIN (HGB A1C): HEMOGLOBIN A1C: 6.7

## 2016-02-07 NOTE — Progress Notes (Signed)
Subjective:    Patient ID: Mark Brady, male    DOB: 12/06/1929, 81 y.o.   MRN: [id number]  HPI AWV- Annual Wellness Visit  The patient was seen for their annual wellness visit. The patient's past medical history, surgical history, and family history were reviewed. Pertinent vaccines were reviewed ( tetanus, pneumonia, shingles, flu) The patient's medication list was reviewed and updated.  The height and weight were entered. The patient's current BMI is: 27.19  Cognitive screening was completed. Outcome of Mini - Cog: FAIL  Falls within the past 6 months:none  Current tobacco usage: none (All patients who use tobacco were given written and verbal information on quitting)  Recent listing of emergency department/hospitalizations over the past year were reviewed.  current specialist the patient sees on a regular basis: none   Medicare annual wellness visit patient questionnaire was reviewed.  A written screening schedule for the patient for the next 5-10 years was given. Appropriate discussion of followup regarding next visit was discussed.  a1c 6.7.  Results for orders placed or performed in visit on 02/07/16  POCT glycosylated hemoglobin (Hb A1C)  Result Value Ref Range   Hemoglobin A1C 6.7    Patient continues to take lipid medication regularly. No obvious side effects from it. Generally does not miss a dose. Prior blood work results are reviewed with patient. Patient continues to work on fat intake in diet  Patient claims compliance with diabetes medication. No obvious side effects. Reports no substantial low sugar spells. Most numbers are generally in good range when checked fasting. Generally does not miss a dose of medication. Watching diabetic diet closely    Review of Systems  Constitutional: Negative for activity change, appetite change and fever.  HENT: Negative for congestion and rhinorrhea.   Eyes: Negative for discharge.  Respiratory: Negative for cough  and wheezing.   Cardiovascular: Negative for chest pain.  Gastrointestinal: Negative for abdominal pain, blood in stool and vomiting.  Genitourinary: Negative for difficulty urinating and frequency.  Musculoskeletal: Negative for neck pain.  Skin: Negative for rash.  Allergic/Immunologic: Negative for environmental allergies and food allergies.  Neurological: Negative for weakness and headaches.  Psychiatric/Behavioral: Negative for agitation.  All other systems reviewed and are negative.      Objective:   Physical Exam  Constitutional: He appears well-developed and well-nourished.  HENT:  Head: Normocephalic and atraumatic.  Right Ear: External ear normal.  Left Ear: External ear normal.  Nose: Nose normal.  Mouth/Throat: Oropharynx is clear and moist.  Eyes: EOM are normal. Pupils are equal, round, and reactive to light.  Neck: Normal range of motion. Neck supple. No thyromegaly present.  Cardiovascular: Normal rate, regular rhythm and normal heart sounds.   No murmur heard. Pulmonary/Chest: Effort normal and breath sounds normal. No respiratory distress. He has no wheezes.  Abdominal: Soft. Bowel sounds are normal. He exhibits no distension and no mass. There is no tenderness.  Genitourinary: Penis normal.  Musculoskeletal: Normal range of motion. He exhibits no edema.  Lymphadenopathy:    He has no cervical adenopathy.  Neurological: He is alert. He exhibits normal muscle tone.  Skin: Skin is warm and dry. No erythema.  Psychiatric: He has a normal mood and affect. His behavior is normal. Judgment normal.          Assessment & Plan:  Impression 1 wellness exam #2 hyperlipidemia much improved #3 hypertension good control discussed #4 type 2 diabetes excellent control discussed plan flu shot today. Blood work reviewed.  Diet exercise discussed. Recheck in 6 months. Exercise encourage WSL

## 2016-02-10 ENCOUNTER — Ambulatory Visit: Payer: Medicare Other | Admitting: Family Medicine

## 2016-02-13 ENCOUNTER — Ambulatory Visit: Payer: Medicare Other | Admitting: Family Medicine

## 2016-02-14 ENCOUNTER — Other Ambulatory Visit: Payer: Self-pay | Admitting: Family Medicine

## 2016-03-11 ENCOUNTER — Other Ambulatory Visit: Payer: Self-pay | Admitting: Family Medicine

## 2016-04-01 ENCOUNTER — Other Ambulatory Visit: Payer: Self-pay | Admitting: Family Medicine

## 2016-04-17 ENCOUNTER — Telehealth: Payer: Self-pay | Admitting: Family Medicine

## 2016-04-17 NOTE — Telephone Encounter (Signed)
Script faxed to pharmacy. Patient was notified.

## 2016-04-17 NOTE — Telephone Encounter (Signed)
Pt is needing a prescription for accu-check soft clix lancets.     Mark Brady

## 2016-04-23 ENCOUNTER — Ambulatory Visit (INDEPENDENT_AMBULATORY_CARE_PROVIDER_SITE_OTHER): Payer: Medicare Other | Admitting: Family Medicine

## 2016-04-23 ENCOUNTER — Encounter: Payer: Self-pay | Admitting: Family Medicine

## 2016-04-23 VITALS — BP 130/80 | Ht 66.5 in | Wt 175.4 lb

## 2016-04-23 DIAGNOSIS — M1000 Idiopathic gout, unspecified site: Secondary | ICD-10-CM

## 2016-04-23 MED ORDER — PREDNISONE 10 MG PO TABS: ORAL_TABLET | ORAL | 0 refills | Status: DC

## 2016-04-23 MED ORDER — LOSARTAN POTASSIUM 50 MG PO TABS: 50.0000 mg | ORAL_TABLET | Freq: Every day | ORAL | 1 refills | Status: DC

## 2016-04-23 NOTE — Patient Instructions (Signed)
Gout Introduction Gout is painful swelling that can happen in some of your joints. Gout is a type of arthritis. This condition is caused by having too much uric acid in your body. Uric acid is a chemical that is made when your body breaks down substances called purines. If your body has too much uric acid, sharp crystals can form and build up in your joints. This causes pain and swelling. Gout attacks can happen quickly and be very painful (acute gout). Over time, the attacks can affect more joints and happen more often (chronic gout). Follow these instructions at home: During a Gout Attack  If directed, put ice on the painful area:  Put ice in a plastic bag.  Place a towel between your skin and the bag.  Leave the ice on for 20 minutes, 2-3 times a day.  Rest the joint as much as possible. If the joint is in your leg, you may be given crutches to use.  Raise (elevate) the painful joint above the level of your heart as often as you can.  Drink enough fluids to keep your pee (urine) clear or pale yellow.  Take over-the-counter and prescription medicines only as told by your doctor.  Do not drive or use heavy machinery while taking prescription pain medicine.  Follow instructions from your doctor about what you can or cannot eat and drink.  Return to your normal activities as told by your doctor. Ask your doctor what activities are safe for you. Avoiding Future Gout Attacks  Follow a low-purine diet as told by a specialist (dietitian) or your doctor. Avoid foods and drinks that have a lot of purines, such as:  Liver.  Kidney.  Anchovies.  Asparagus.  Herring.  Mushrooms  Mussels.  Beer.  Limit alcohol intake to no more than 1 drink a day for nonpregnant women and 2 drinks a day for men. One drink equals 12 oz of beer, 5 oz of wine, or 1 oz of hard liquor.  Stay at a healthy weight or lose weight if you are overweight. If you want to lose weight, talk with your doctor.  It is important that you do not lose weight too fast.  Start or continue an exercise plan as told by your doctor.  Drink enough fluids to keep your pee clear or pale yellow.  Take over-the-counter and prescription medicines only as told by your doctor.  Keep all follow-up visits as told by your doctor. This is important. Contact a doctor if:  You have another gout attack.  You still have symptoms of a gout attack after10 days of treatment.  You have problems (side effects) because of your medicines.  You have chills or a fever.  You have burning pain when you pee (urinate).  You have pain in your lower back or belly. Get help right away if:  You have very bad pain.  Your pain cannot be controlled.  You cannot pee. This information is not intended to replace advice given to you by your health care provider. Make sure you discuss any questions you have with your health care provider. Document Released: 01/10/2008 Document Revised: 09/08/2015 Document Reviewed: 01/13/2015  2017 Elsevier

## 2016-04-23 NOTE — Progress Notes (Signed)
   Subjective:    Patient ID: Mark Brady, male    DOB: June 08, 1929, 83 y.o.   MRN: [id number]  HPI Patient arrives with c/o right foot pain and swelling since Friday.  The pain is fairly intense.  Started with swelling at the first metatarsophalangeal joint of the right foot. Next  No close family history of gout. Next  Patient is been on diuretics for a couple years. Next  Patient on diuretics to reduce the risk of another stroke  No excess intake of purine rich foods her history.  Minimal responsive to over-the-counter analgesics this weekend.   Review of Systems No headache, no major weight loss or weight gain, no chest pain no back pain abdominal pain no change in bowel habits complete ROS otherwise negative     Objective:   Physical Exam Alert vitals stable, NAD. Blood pressure good on repeat. HEENT normal. Lungs clear. Heart regular rate and rhythm. Left metatarsophalangeal joint inflamed tender swollen impressively so       Assessment & Plan:  Impression gout worse attack very long discussion held about the nature of this long-term implications possible underlying factors. Education information given. Elect to stop Dyazide at this time. Rationale discussed with family. Double up losartan. Prednisone taper. No primary prophylaxis yet. Recheck in one month, easily 25 minutes most spent in discussion WSL

## 2016-05-24 ENCOUNTER — Encounter: Payer: Self-pay | Admitting: Family Medicine

## 2016-05-24 ENCOUNTER — Ambulatory Visit (INDEPENDENT_AMBULATORY_CARE_PROVIDER_SITE_OTHER): Payer: Medicare Other | Admitting: Family Medicine

## 2016-05-24 VITALS — BP 138/80 | Ht 66.5 in | Wt 178.2 lb

## 2016-05-24 DIAGNOSIS — E119 Type 2 diabetes mellitus without complications: Secondary | ICD-10-CM

## 2016-05-24 DIAGNOSIS — I10 Essential (primary) hypertension: Secondary | ICD-10-CM

## 2016-05-24 DIAGNOSIS — E785 Hyperlipidemia, unspecified: Secondary | ICD-10-CM | POA: Diagnosis not present

## 2016-05-24 NOTE — Progress Notes (Signed)
   Subjective:    Patient ID: Mark Brady, male    DOB: [birth year], 83 y.o.   MRN: [id number]  HPI Patient arrives for a follow up on blood pressure since taking losartin 50 mg.   Blood pressure medicine and blood pressure levels reviewed today with patient. Compliant with blood pressure medicine. States does not miss a dose. No obvious side effects. Blood pressure generally good when checked elsewhere. Watching salt intake. Results for orders placed or performed in visit on 02/07/16  POCT glycosylated hemoglobin (Hb A1C)  Result Value Ref Range   Hemoglobin A1C 6.7      Patient also states his got is gone.   Developed a rash after the steroids  Patient states that he got a rash on his back when taking the prednisone for his gout but unsure if  It was related to prednisone.   Review of Systems No headache, no major weight loss or weight gain, no chest pain no back pain abdominal pain no change in bowel habits complete ROS otherwise negative     Objective:   Physical Exam  Alert vitals stable, NAD. Blood pressure good on repeat. HEENT normal. Lungs clear. Heart regular rate and rhythm.       Assessment & Plan:  Impression 1 gout result #2 hypertension good control with change in meds. On repeat blood work pressure excellent plan diet exercise discussed maintain same meds follow-up of chronic visit

## 2016-07-14 ENCOUNTER — Other Ambulatory Visit: Payer: Self-pay | Admitting: Family Medicine

## 2016-07-17 ENCOUNTER — Telehealth: Payer: Self-pay | Admitting: Family Medicine

## 2016-07-17 MED ORDER — PREDNISONE 20 MG PO TABS: ORAL_TABLET | ORAL | 0 refills | Status: DC

## 2016-07-17 NOTE — Telephone Encounter (Signed)
Left message return call 07/17/2016 ( medication sent into pharmacy)

## 2016-07-17 NOTE — Telephone Encounter (Signed)
Adult pred taoer

## 2016-07-17 NOTE — Telephone Encounter (Signed)
Spoke with patient's wife and informed her per Dr.Steve Luking- we were sending in Prednisone Taper. Patient's wife verbalized understanding.

## 2016-07-17 NOTE — Telephone Encounter (Signed)
Patient was seen a couple of month ago by Dr. Richardson Landry for gout.  Mark Brady says that his big toe is red and warm and would like Rx for gout called in.  [facility]

## 2016-07-27 DIAGNOSIS — E119 Type 2 diabetes mellitus without complications: Secondary | ICD-10-CM | POA: Diagnosis not present

## 2016-07-27 DIAGNOSIS — E785 Hyperlipidemia, unspecified: Secondary | ICD-10-CM | POA: Diagnosis not present

## 2016-07-27 DIAGNOSIS — I10 Essential (primary) hypertension: Secondary | ICD-10-CM | POA: Diagnosis not present

## 2016-07-28 LAB — HEPATIC FUNCTION PANEL
ALBUMIN: 4.1 g/dL (ref 3.5–4.7)
ALT: 20 IU/L (ref 0–44)
AST: 13 IU/L (ref 0–40)
Alkaline Phosphatase: 48 IU/L (ref 39–117)
BILIRUBIN TOTAL: 0.4 mg/dL (ref 0.0–1.2)
BILIRUBIN, DIRECT: 0.14 mg/dL (ref 0.00–0.40)
Total Protein: 6.5 g/dL (ref 6.0–8.5)

## 2016-07-28 LAB — LIPID PANEL
Chol/HDL Ratio: 2.3 ratio (ref 0.0–5.0)
Cholesterol, Total: 163 mg/dL (ref 100–199)
HDL: 71 mg/dL (ref >39)
LDL Calculated: 76 mg/dL (ref 0–99)
TRIGLYCERIDES: 78 mg/dL (ref 0–149)
VLDL Cholesterol Cal: 16 mg/dL (ref 5–40)

## 2016-07-28 LAB — HEMOGLOBIN A1C
Est. average glucose Bld gHb Est-mCnc: 143 mg/dL
Hgb A1c MFr Bld: 6.6 % — ABNORMAL HIGH (ref 4.8–5.6)

## 2016-08-07 ENCOUNTER — Ambulatory Visit (INDEPENDENT_AMBULATORY_CARE_PROVIDER_SITE_OTHER): Payer: Medicare Other | Admitting: Family Medicine

## 2016-08-07 ENCOUNTER — Encounter: Payer: Self-pay | Admitting: Family Medicine

## 2016-08-07 VITALS — BP 142/80 | Ht 66.5 in | Wt 175.0 lb

## 2016-08-07 DIAGNOSIS — E785 Hyperlipidemia, unspecified: Secondary | ICD-10-CM | POA: Diagnosis not present

## 2016-08-07 DIAGNOSIS — E119 Type 2 diabetes mellitus without complications: Secondary | ICD-10-CM

## 2016-08-07 DIAGNOSIS — M1000 Idiopathic gout, unspecified site: Secondary | ICD-10-CM | POA: Diagnosis not present

## 2016-08-07 DIAGNOSIS — E7800 Pure hypercholesterolemia, unspecified: Secondary | ICD-10-CM | POA: Diagnosis not present

## 2016-08-07 DIAGNOSIS — I10 Essential (primary) hypertension: Secondary | ICD-10-CM

## 2016-08-07 MED ORDER — PRAVASTATIN SODIUM 40 MG PO TABS: ORAL_TABLET | ORAL | 1 refills | Status: DC

## 2016-08-07 MED ORDER — LOSARTAN POTASSIUM 50 MG PO TABS: 50.0000 mg | ORAL_TABLET | Freq: Every day | ORAL | 1 refills | Status: DC

## 2016-08-07 NOTE — Progress Notes (Signed)
   Subjective:    Patient ID: Mark Brady, male    DOB: 1929/09/03, 83 y.o.   MRN: [id number] Testing patient arrives office with numerous concerns Diabetes  He presents for his follow-up diabetic visit. He has type 2 diabetes mellitus.   Results for orders placed or performed in visit on 05/24/16  Lipid panel  Result Value Ref Range   Cholesterol, Total 163 100 - 199 mg/dL   Triglycerides 78 0 - 149 mg/dL   HDL 71 >39 mg/dL   VLDL Cholesterol Cal 16 5 - 40 mg/dL   LDL Calculated 76 0 - 99 mg/dL   Chol/HDL Ratio 2.3 0.0 - 5.0 ratio  Hepatic function panel  Result Value Ref Range   Total Protein 6.5 6.0 - 8.5 g/dL   Albumin 4.1 3.5 - 4.7 g/dL   Bilirubin Total 0.4 0.0 - 1.2 mg/dL   Bilirubin, Direct 0.14 0.00 - 0.40 mg/dL   Alkaline Phosphatase 48 39 - 117 IU/L   AST 13 0 - 40 IU/L   ALT 20 0 - 44 IU/L  Hemoglobin A1c  Result Value Ref Range   Hgb A1c MFr Bld 6.6 (H) 4.8 - 5.6 %   Est. average glucose Bld gHb Est-mCnc 143 mg/dL   Blood pressure medicine and blood pressure levels reviewed today with patient. Compliant with blood pressure medicine. States does not miss a dose. No obvious side effects. Blood pressure generally good when checked elsewhere. Watching salt intake.   Patient continues to take lipid medication regularly. No obvious side effects from it. Generally does not miss a dose. Prior blood work results are reviewed with patient. Patient continues to work on fat intake in diet  Patient claims compliance with diabetes medication. No obvious side effects. Reports no substantial low sugar spells. Most numbers are generally in good range when checked fasting. Generally does not miss a dose of medication. Watching diabetic diet closely  Gout has pretty much resolved. No residual symptoms. No further flares.   Glu in the a m between 100 and 110,       Usually lws   Patient states no concerns this visit.   Review of Systems No headache, no major weight  loss or weight gain, no chest pain no back pain abdominal pain no change in bowel habits complete ROS otherwise negative     Objective:   Physical Exam  Alert and oriented, vitals reviewed and stable, NAD ENT-TM's and ext canals WNL bilat via otoscopic exam Soft palate, tonsils and post pharynx WNL via oropharyngeal exam Neck-symmetric, no masses; thyroid nonpalpable and nontender Pulmonary-no tachypnea or accessory muscle use; Clear without wheezes via auscultation Card--no abnrml murmurs, rhythm reg and rate WNL Carotid pulses symmetric, without bruits       Assessment & Plan:  Impression 1 gout resolved discussed too premature to consider additional medicines #2 hypertension suboptimal discussed patient wishes to work on his own efforts for 3 more months which is reasonable discussed #3 type 2 diabetes excellent control discussed maintain same meds #4 hyperlipidemia excellent control discussed maintain same meds #5 history of stroke no further neuro symptomatology follow-up as scheduled with do A1c

## 2016-08-28 IMAGING — CT CT HEAD W/O CM
1 series · 16 of 30 positions shown, 20 images · non-contrast
Comparison: 06/02/2010

CLINICAL DATA: Dizziness and hypertension

EXAM:
CT HEAD WITHOUT CONTRAST
TECHNIQUE: Contiguous axial images were obtained from the base of the skull
through the vertex without intravenous contrast.

[Series 2: headseq 4.8 h37s · axial · 0.54mm/px · z∈[+163,+296]mm · 16 of 30 slices shown, 20 images]
[im 2/30  brain]
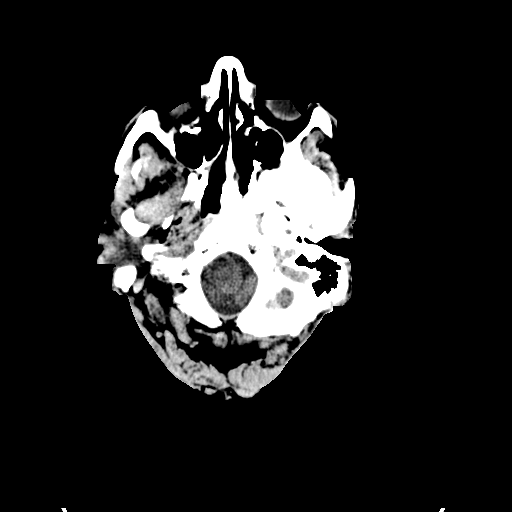
[im 2/30  bone]
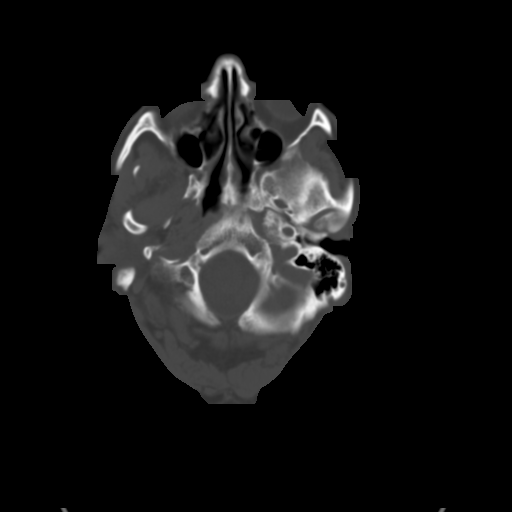
[im 4/30  brain]
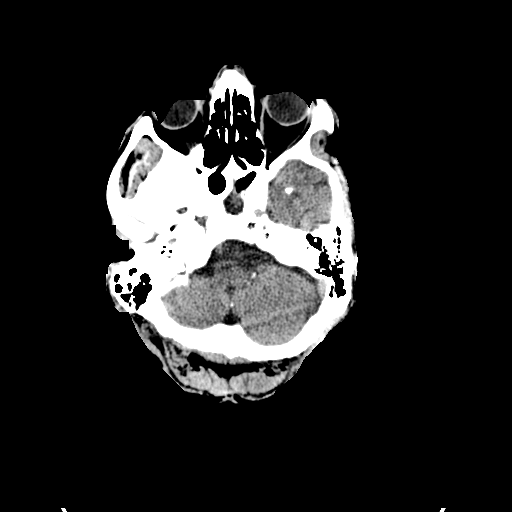
[im 6/30  brain]
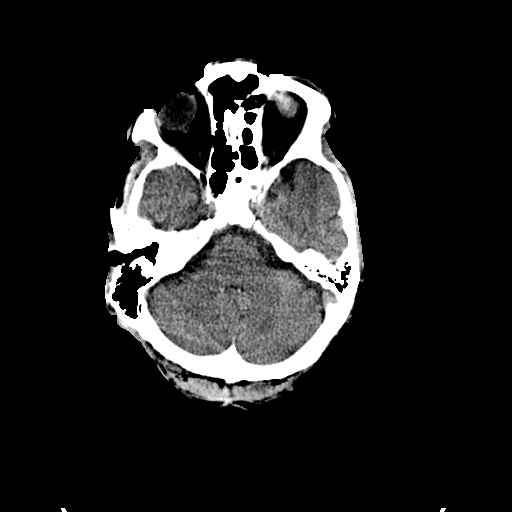
[im 8/30  brain]
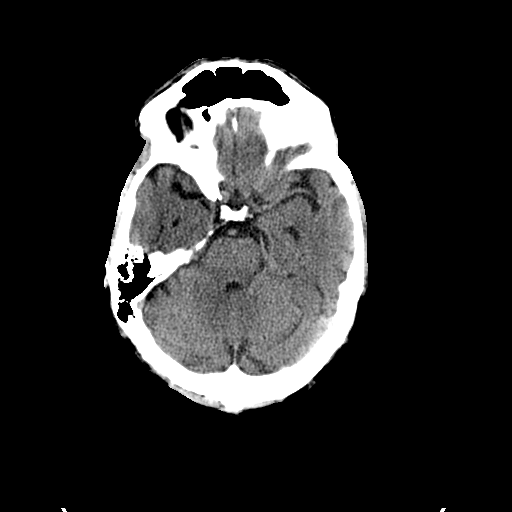
[im 9/30  brain]
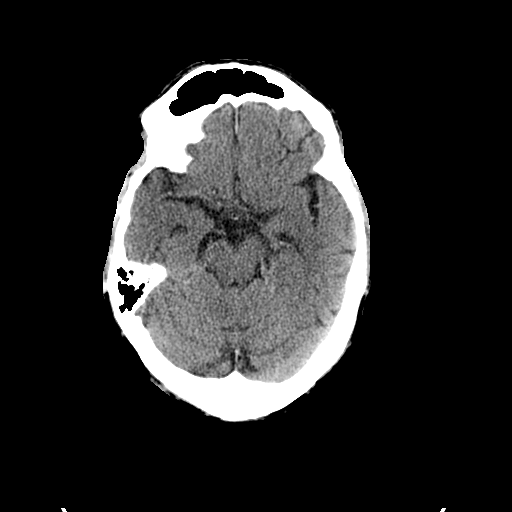
[im 9/30  bone]
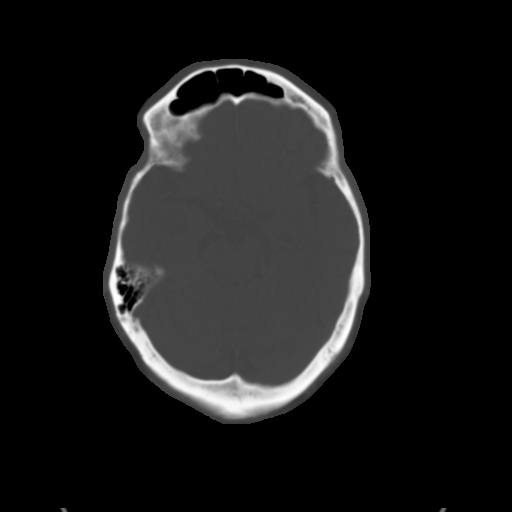
[im 11/30  brain]
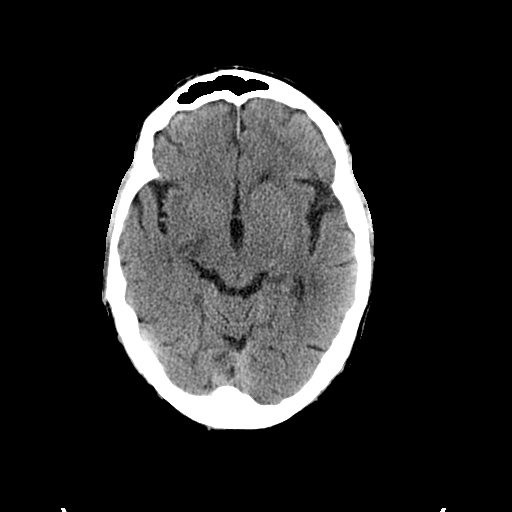
[im 13/30  brain]
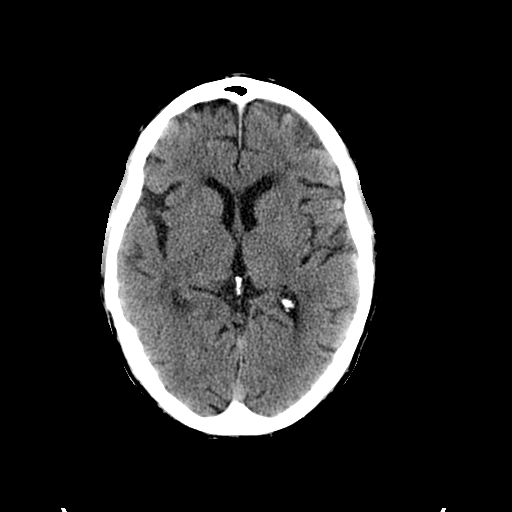
[im 15/30  brain]
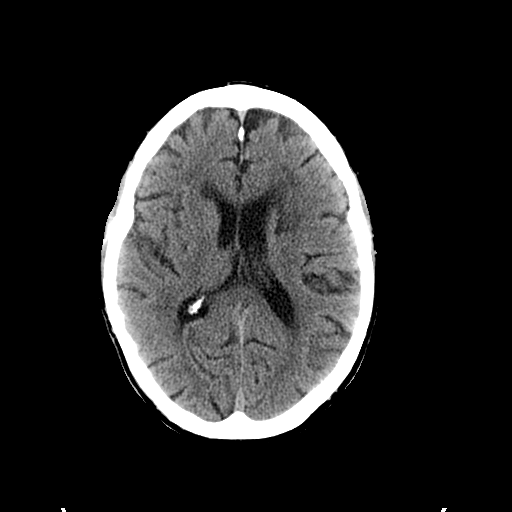
[im 16/30  brain]
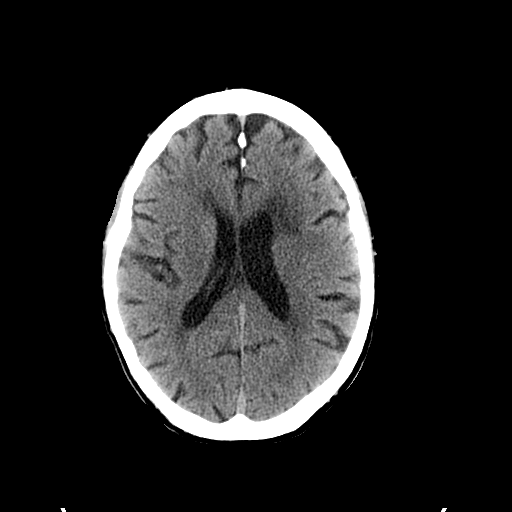
[im 16/30  bone]
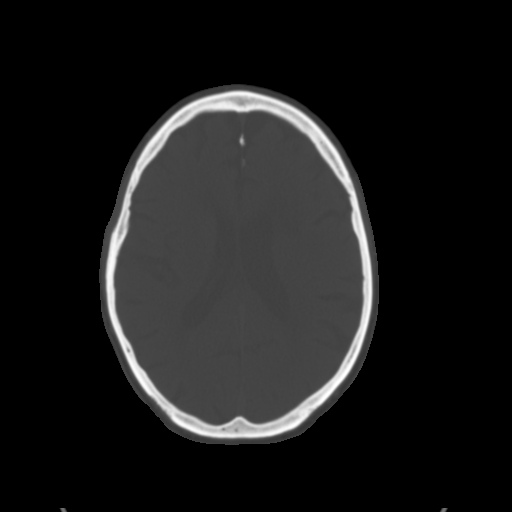
[im 18/30  brain]
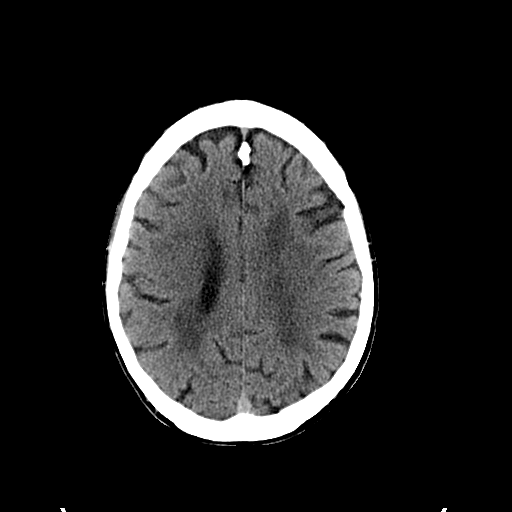
[im 20/30  brain]
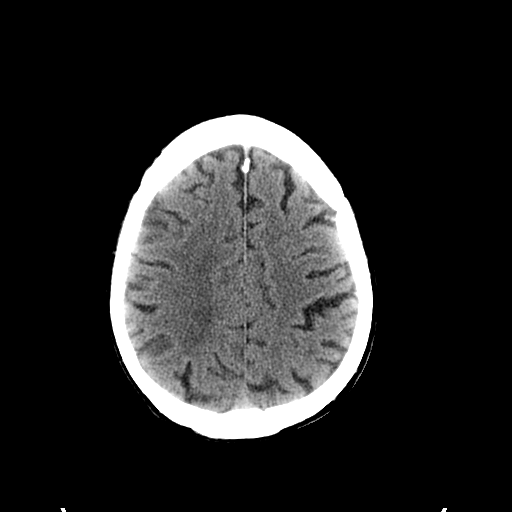
[im 22/30  brain]
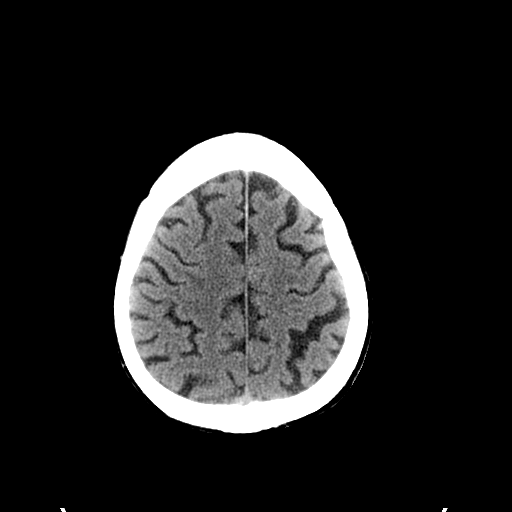
[im 23/30  brain]
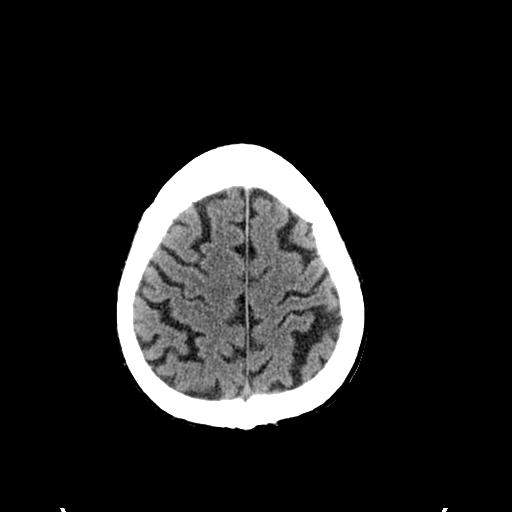
[im 23/30  bone]
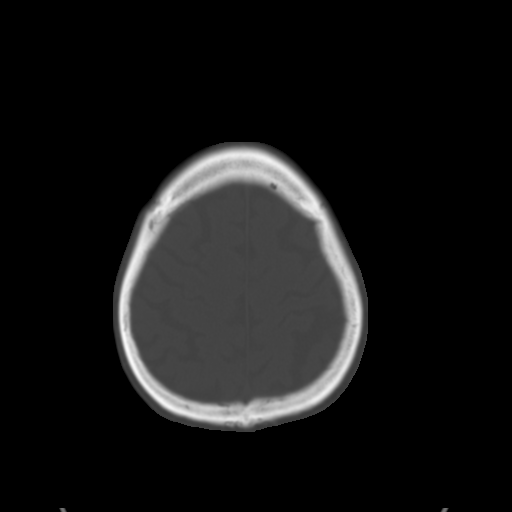
[im 25/30  brain]
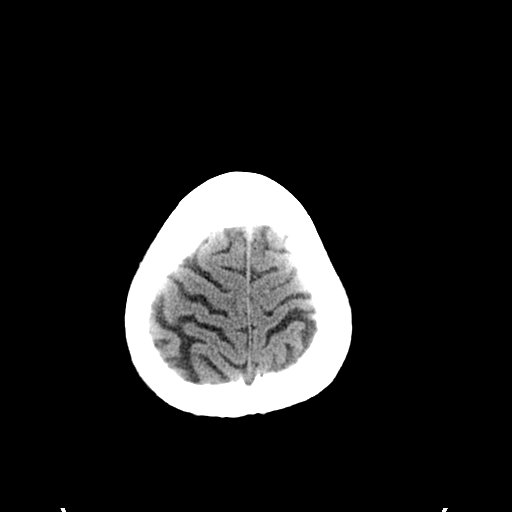
[im 27/30  brain]
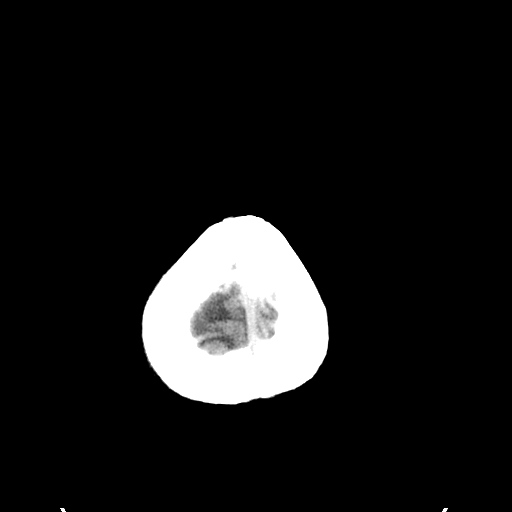
[im 29/30  brain]
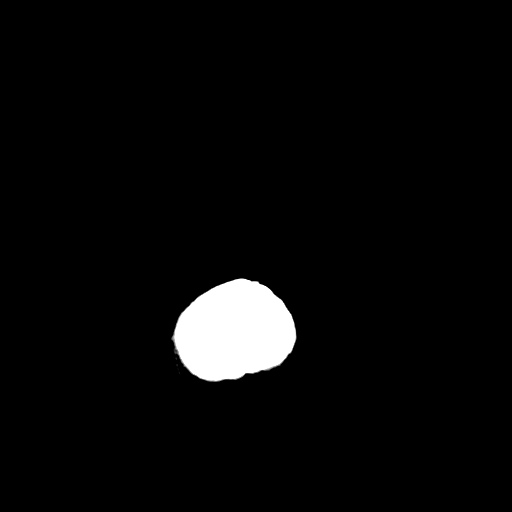

[16 of 30 positions shown; findings below may reference images not displayed]

FINDINGS: Bony calvarium is intact. Mild atrophic changes are seen. Scattered
areas of decreased attenuation are noted within the deep white
matter bilaterally consistent with chronic ischemic change. Prior
lacunar infarct is noted in the region of the basal ganglia on the
left. No findings to suggest acute hemorrhage, acute infarct or
space-occupying mass lesion is noted.
IMPRESSION: Chronic ischemic change without acute abnormality.

## 2016-11-03 ENCOUNTER — Other Ambulatory Visit: Payer: Self-pay | Admitting: Family Medicine

## 2016-11-06 ENCOUNTER — Ambulatory Visit: Payer: Medicare Other | Admitting: Family Medicine

## 2016-11-06 ENCOUNTER — Encounter: Payer: Self-pay | Admitting: Family Medicine

## 2016-11-06 ENCOUNTER — Ambulatory Visit (INDEPENDENT_AMBULATORY_CARE_PROVIDER_SITE_OTHER): Payer: Medicare Other | Admitting: Family Medicine

## 2016-11-06 VITALS — BP 130/84 | Ht 66.5 in | Wt 172.0 lb

## 2016-11-06 DIAGNOSIS — E119 Type 2 diabetes mellitus without complications: Secondary | ICD-10-CM | POA: Diagnosis not present

## 2016-11-06 LAB — POCT GLYCOSYLATED HEMOGLOBIN (HGB A1C): HEMOGLOBIN A1C: 6.1

## 2016-11-06 MED ORDER — LOSARTAN POTASSIUM 50 MG PO TABS: 50.0000 mg | ORAL_TABLET | Freq: Every day | ORAL | 1 refills | Status: DC

## 2016-11-06 MED ORDER — PRAVASTATIN SODIUM 40 MG PO TABS: ORAL_TABLET | ORAL | 1 refills | Status: DC

## 2016-11-06 NOTE — Progress Notes (Signed)
   Subjective:    Patient ID: Mark Brady, male    DOB: 01-25-1930, 83 y.o.   MRN: [id number]  Diabetes  He presents for his follow-up diabetic visit. He has type 2 diabetes mellitus. Risk factors for coronary artery disease include hypertension, sedentary lifestyle and dyslipidemia. Current diabetic treatment includes diet. He is compliant with treatment all of the time. His weight is stable. He is following a diabetic diet. He has not had a previous visit with a dietitian. He does not see a podiatrist.Eye exam is current.   Bright light affects eyes since cataract surgery. havindg difficulties with eyes, sw dr Jorja Loa, told not much could be one about the surg  Patient claims compliance with diabetes medication. No obvious side effects. Reports no substantial low sugar spells. Most numbers are generally in good range when checked fasting. Generally does not miss a dose of medication. Watching diabetic diet closely  Blood pressure medicine and blood pressure levels reviewed today with patient. Compliant with blood pressure medicine. States does not miss a dose. No obvious side effects. Blood pressure generally good when checked elsewhere. Watching salt intake.   Patient continues to take lipid medication regularly. No obvious side effects from it. Generally does not miss a dose. Prior blood work results are reviewed with patient. Patient continues to work on fat intake in diet  Results for orders placed or performed in visit on 11/06/16  POCT glycosylated hemoglobin (Hb A1C)  Result Value Ref Range   Hemoglobin A1C 6.1    Excellent aic  +  130s over 80s Review of Systems No headache, no major weight loss or weight gain, no chest pain no back pain abdominal pain no change in bowel habits complete ROS otherwise negative     Objective:   Physical Exam  Alert and oriented, vitals reviewed and stable, NAD ENT-TM's and ext canals WNL bilat via otoscopic exam Soft palate, tonsils and  post pharynx WNL via oropharyngeal exam Neck-symmetric, no masses; thyroid nonpalpable and nontender Pulmonary-no tachypnea or accessory muscle use; Clear without wheezes via auscultation Card--no abnrml murmurs, rhythm reg and rate WNL Carotid pulses symmetric, without bruits       Assessment & Plan:  Impression 1 type 2 diabetes excellent control discussed no hypoglycemic episodes #2 hypertension very good control discussed maintain same #3 hyperlipidemia. Prior blood work results discussed maintain same #4 history of stroke. Clinically stable. Neurologically no new symptoms. Diet exercise. Discussed compliance discussed the BUS

## 2017-01-28 ENCOUNTER — Telehealth: Payer: Self-pay | Admitting: Family Medicine

## 2017-01-28 DIAGNOSIS — I10 Essential (primary) hypertension: Secondary | ICD-10-CM

## 2017-01-28 DIAGNOSIS — E785 Hyperlipidemia, unspecified: Secondary | ICD-10-CM

## 2017-01-28 DIAGNOSIS — Z79899 Other long term (current) drug therapy: Secondary | ICD-10-CM

## 2017-01-28 DIAGNOSIS — E119 Type 2 diabetes mellitus without complications: Secondary | ICD-10-CM

## 2017-01-28 NOTE — Telephone Encounter (Signed)
Lab orders ready. Pt notified

## 2017-01-28 NOTE — Telephone Encounter (Signed)
Lip liv m7 A1c urine albumin to creat ratio

## 2017-01-28 NOTE — Telephone Encounter (Signed)
Patient has appointment 10/24 for 3 month follow up and was wondering if he needed labs done.

## 2017-01-31 DIAGNOSIS — I10 Essential (primary) hypertension: Secondary | ICD-10-CM | POA: Diagnosis not present

## 2017-01-31 DIAGNOSIS — E119 Type 2 diabetes mellitus without complications: Secondary | ICD-10-CM | POA: Diagnosis not present

## 2017-01-31 DIAGNOSIS — Z79899 Other long term (current) drug therapy: Secondary | ICD-10-CM | POA: Diagnosis not present

## 2017-01-31 DIAGNOSIS — E785 Hyperlipidemia, unspecified: Secondary | ICD-10-CM | POA: Diagnosis not present

## 2017-02-01 LAB — MICROALBUMIN / CREATININE URINE RATIO
Creatinine, Urine: 138.7 mg/dL
MICROALB/CREAT RATIO: 11 mg/g{creat} (ref 0.0–30.0)
MICROALBUM., U, RANDOM: 15.3 ug/mL

## 2017-02-01 LAB — HEMOGLOBIN A1C
Est. average glucose Bld gHb Est-mCnc: 146 mg/dL
Hgb A1c MFr Bld: 6.7 % — ABNORMAL HIGH (ref 4.8–5.6)

## 2017-02-01 LAB — LIPID PANEL
CHOL/HDL RATIO: 2.7 ratio (ref 0.0–5.0)
Cholesterol, Total: 185 mg/dL (ref 100–199)
HDL: 68 mg/dL (ref >39)
LDL Calculated: 102 mg/dL — ABNORMAL HIGH (ref 0–99)
TRIGLYCERIDES: 77 mg/dL (ref 0–149)
VLDL Cholesterol Cal: 15 mg/dL (ref 5–40)

## 2017-02-01 LAB — BASIC METABOLIC PANEL WITH GFR
BUN/Creatinine Ratio: 15 (ref 10–24)
BUN: 16 mg/dL (ref 8–27)
CO2: 24 mmol/L (ref 20–29)
Calcium: 9.2 mg/dL (ref 8.6–10.2)
Chloride: 104 mmol/L (ref 96–106)
Creatinine, Ser: 1.1 mg/dL (ref 0.76–1.27)
GFR calc Af Amer: 70 mL/min/{1.73_m2}
GFR calc non Af Amer: 61 mL/min/{1.73_m2}
Glucose: 124 mg/dL — ABNORMAL HIGH (ref 65–99)
Potassium: 4.6 mmol/L (ref 3.5–5.2)
Sodium: 141 mmol/L (ref 134–144)

## 2017-02-01 LAB — HEPATIC FUNCTION PANEL
ALT: 18 [IU]/L (ref 0–44)
AST: 14 [IU]/L (ref 0–40)
Albumin: 4.3 g/dL (ref 3.5–4.7)
Alkaline Phosphatase: 62 [IU]/L (ref 39–117)
Bilirubin Total: 0.4 mg/dL (ref 0.0–1.2)
Bilirubin, Direct: 0.12 mg/dL (ref 0.00–0.40)
Total Protein: 6.7 g/dL (ref 6.0–8.5)

## 2017-02-06 ENCOUNTER — Encounter: Payer: Self-pay | Admitting: Family Medicine

## 2017-02-06 ENCOUNTER — Ambulatory Visit (INDEPENDENT_AMBULATORY_CARE_PROVIDER_SITE_OTHER): Payer: Medicare Other | Admitting: Family Medicine

## 2017-02-06 VITALS — BP 148/90 | Ht 66.5 in | Wt 170.0 lb

## 2017-02-06 DIAGNOSIS — Z23 Encounter for immunization: Secondary | ICD-10-CM

## 2017-02-06 DIAGNOSIS — E7800 Pure hypercholesterolemia, unspecified: Secondary | ICD-10-CM

## 2017-02-06 DIAGNOSIS — I10 Essential (primary) hypertension: Secondary | ICD-10-CM | POA: Diagnosis not present

## 2017-02-06 DIAGNOSIS — E119 Type 2 diabetes mellitus without complications: Secondary | ICD-10-CM | POA: Diagnosis not present

## 2017-02-06 MED ORDER — OMEPRAZOLE 20 MG PO CPDR: 20.0000 mg | DELAYED_RELEASE_CAPSULE | Freq: Every day | ORAL | 3 refills | Status: DC

## 2017-02-06 MED ORDER — LOSARTAN POTASSIUM 50 MG PO TABS: 50.0000 mg | ORAL_TABLET | Freq: Every day | ORAL | 1 refills | Status: DC

## 2017-02-06 MED ORDER — PRAVASTATIN SODIUM 40 MG PO TABS: ORAL_TABLET | ORAL | 1 refills | Status: DC

## 2017-02-06 NOTE — Patient Instructions (Signed)
Results for orders placed or performed in visit on 01/28/17  Lipid panel  Result Value Ref Range   Cholesterol, Total 185 100 - 199 mg/dL   Triglycerides 77 0 - 149 mg/dL   HDL 68 >39 mg/dL   VLDL Cholesterol Cal 15 5 - 40 mg/dL   LDL Calculated 102 (H) 0 - 99 mg/dL   Chol/HDL Ratio 2.7 0.0 - 5.0 ratio  Hepatic function panel  Result Value Ref Range   Total Protein 6.7 6.0 - 8.5 g/dL   Albumin 4.3 3.5 - 4.7 g/dL   Bilirubin Total 0.4 0.0 - 1.2 mg/dL   Bilirubin, Direct 0.12 0.00 - 0.40 mg/dL   Alkaline Phosphatase 62 39 - 117 IU/L   AST 14 0 - 40 IU/L   ALT 18 0 - 44 IU/L  Basic metabolic panel  Result Value Ref Range   Glucose 124 (H) 65 - 99 mg/dL   BUN 16 8 - 27 mg/dL   Creatinine, Ser 1.10 0.76 - 1.27 mg/dL   GFR calc non Af Amer 61 >59 mL/min/1.73   GFR calc Af Amer 70 >59 mL/min/1.73   BUN/Creatinine Ratio 15 10 - 24   Sodium 141 134 - 144 mmol/L   Potassium 4.6 3.5 - 5.2 mmol/L   Chloride 104 96 - 106 mmol/L   CO2 24 20 - 29 mmol/L   Calcium 9.2 8.6 - 10.2 mg/dL  Hemoglobin A1c  Result Value Ref Range   Hgb A1c MFr Bld 6.7 (H) 4.8 - 5.6 %   Est. average glucose Bld gHb Est-mCnc 146 mg/dL  Microalbumin / creatinine urine ratio  Result Value Ref Range   Creatinine, Urine 138.7 Not Estab. mg/dL   Albumin, Urine 15.3 Not Estab. ug/mL   Microalb/Creat Ratio 11.0 0.0 - 30.0 mg/g creat   May take two reg strength tylenols twice per day for the knee pain

## 2017-02-06 NOTE — Progress Notes (Signed)
Subjective:    Patient ID: Mark Brady, male    DOB: 09-03-1929, 83 y.o.   MRN: [id number]  Diabetes  He presents for his follow-up diabetic visit. He has type 2 diabetes mellitus. He is compliant with treatment all of the time. He is following a diabetic diet. Exercise: walks when he can. He does not see a podiatrist.Eye exam is not current (has upcoming appt in november. last one aug 2017).  A1C done on bloodwork 6.7 6 days ago.   Not sleeping much. Sleeps more in the day than at night.   otc med for heartburn not helping much. Would like to try rx of omeprazole 20mg .   Right knee pain. Started a long time ago. Knee painful and aching   Results for orders placed or performed in visit on 01/28/17  Lipid panel  Result Value Ref Range   Cholesterol, Total 185 100 - 199 mg/dL   Triglycerides 77 0 - 149 mg/dL   HDL 68 >39 mg/dL   VLDL Cholesterol Cal 15 5 - 40 mg/dL   LDL Calculated 102 (H) 0 - 99 mg/dL   Chol/HDL Ratio 2.7 0.0 - 5.0 ratio  Hepatic function panel  Result Value Ref Range   Total Protein 6.7 6.0 - 8.5 g/dL   Albumin 4.3 3.5 - 4.7 g/dL   Bilirubin Total 0.4 0.0 - 1.2 mg/dL   Bilirubin, Direct 0.12 0.00 - 0.40 mg/dL   Alkaline Phosphatase 62 39 - 117 IU/L   AST 14 0 - 40 IU/L   ALT 18 0 - 44 IU/L  Basic metabolic panel  Result Value Ref Range   Glucose 124 (H) 65 - 99 mg/dL   BUN 16 8 - 27 mg/dL   Creatinine, Ser 1.10 0.76 - 1.27 mg/dL   GFR calc non Af Amer 61 >59 mL/min/1.73   GFR calc Af Amer 70 >59 mL/min/1.73   BUN/Creatinine Ratio 15 10 - 24   Sodium 141 134 - 144 mmol/L   Potassium 4.6 3.5 - 5.2 mmol/L   Chloride 104 96 - 106 mmol/L   CO2 24 20 - 29 mmol/L   Calcium 9.2 8.6 - 10.2 mg/dL  Hemoglobin A1c  Result Value Ref Range   Hgb A1c MFr Bld 6.7 (H) 4.8 - 5.6 %   Est. average glucose Bld gHb Est-mCnc 146 mg/dL  Microalbumin / creatinine urine ratio  Result Value Ref Range   Creatinine, Urine 138.7 Not Estab. mg/dL   Albumin, Urine 15.3  Not Estab. ug/mL   Microalb/Creat Ratio 11.0 0.0 - 30.0 mg/g creat    Patient claims compliance with diabetes medication. No obvious side effects. Reports no substantial low sugar spells. Most numbers are generally in good range when checked fasting. Generally does not miss a dose of medication. Watching diabetic diet closely  Patient continues to take lipid medication regularly. No obvious side effects from it. Generally does not miss a dose. Prior blood work results are reviewed with patient. Patient continues to work on fat intake in diet  Blood pressure medicine and blood pressure levels reviewed today with patient. Compliant with blood pressure medicine. States does not miss a dose. No obvious side effects. Blood pressure generally good when checked elsewhere. Watching salt intake.    Flu vaccine today.    Review of Systems No headache, no major weight loss or weight gain, no chest pain no back pain abdominal pain no change in bowel habits complete ROS otherwise negative     Objective:  Physical Exam  Alert and oriented, vitals reviewed and stable, NAD ENT-TM's and ext canals WNL bilat via otoscopic exam Soft palate, tonsils and post pharynx WNL via oropharyngeal exam Neck-symmetric, no masses; thyroid nonpalpable and nontender Pulmonary-no tachypnea or accessory muscle use; Clear without wheezes via auscultation Card--no abnrml murmurs, rhythm reg and rate WNL Carotid pulses symmetric, without bruits Right knee crepitations      Assessment & Plan:  Impression 1 arthritis discuss patient may add Tylenol  #2 hypertension good control discussed maintain same meds  #3 type 2 diabetes good control discussed maintain same approach currently on no meds  #4 hyperlipidemia clinically stable maintain same.  #5insomnia discussed Flu shot today. Medications refilled diet exercise discussed recheck in several months for wellness plus chronic

## 2017-02-28 DIAGNOSIS — H4051X3 Glaucoma secondary to other eye disorders, right eye, severe stage: Secondary | ICD-10-CM | POA: Diagnosis not present

## 2017-03-01 DIAGNOSIS — H401121 Primary open-angle glaucoma, left eye, mild stage: Secondary | ICD-10-CM | POA: Diagnosis not present

## 2017-03-01 DIAGNOSIS — H401113 Primary open-angle glaucoma, right eye, severe stage: Secondary | ICD-10-CM | POA: Diagnosis not present

## 2017-03-13 ENCOUNTER — Other Ambulatory Visit: Payer: Self-pay

## 2017-03-13 ENCOUNTER — Emergency Department (HOSPITAL_COMMUNITY): Payer: Medicare Other

## 2017-03-13 ENCOUNTER — Encounter (HOSPITAL_COMMUNITY): Payer: Self-pay | Admitting: *Deleted

## 2017-03-13 ENCOUNTER — Emergency Department (HOSPITAL_COMMUNITY)
Admission: EM | Admit: 2017-03-13 | Discharge: 2017-03-13 | Disposition: A | Discharge status: Home / Self Care | Payer: Medicare Other | Attending: Emergency Medicine | Admitting: Emergency Medicine

## 2017-03-13 DIAGNOSIS — I10 Essential (primary) hypertension: Secondary | ICD-10-CM | POA: Diagnosis not present

## 2017-03-13 DIAGNOSIS — M5412 Radiculopathy, cervical region: Secondary | ICD-10-CM | POA: Insufficient documentation

## 2017-03-13 DIAGNOSIS — E119 Type 2 diabetes mellitus without complications: Secondary | ICD-10-CM | POA: Insufficient documentation

## 2017-03-13 DIAGNOSIS — Z8673 Personal history of transient ischemic attack (TIA), and cerebral infarction without residual deficits: Secondary | ICD-10-CM | POA: Insufficient documentation

## 2017-03-13 DIAGNOSIS — M542 Cervicalgia: Secondary | ICD-10-CM | POA: Diagnosis present

## 2017-03-13 DIAGNOSIS — Z79899 Other long term (current) drug therapy: Secondary | ICD-10-CM | POA: Diagnosis not present

## 2017-03-13 DIAGNOSIS — Z7982 Long term (current) use of aspirin: Secondary | ICD-10-CM | POA: Insufficient documentation

## 2017-03-13 DIAGNOSIS — E785 Hyperlipidemia, unspecified: Secondary | ICD-10-CM | POA: Insufficient documentation

## 2017-03-13 DIAGNOSIS — R079 Chest pain, unspecified: Secondary | ICD-10-CM | POA: Diagnosis not present

## 2017-03-13 LAB — CBC WITH DIFFERENTIAL/PLATELET
BASOS ABS: 0 10*3/uL (ref 0.0–0.1)
BASOS PCT: 0 %
EOS ABS: 0.5 10*3/uL (ref 0.0–0.7)
EOS PCT: 4 %
HCT: 41.1 % (ref 39.0–52.0)
HEMOGLOBIN: 13.6 g/dL (ref 13.0–17.0)
Lymphocytes Relative: 15 %
Lymphs Abs: 1.6 10*3/uL (ref 0.7–4.0)
MCH: 31.3 pg (ref 26.0–34.0)
MCHC: 33.1 g/dL (ref 30.0–36.0)
MCV: 94.7 fL (ref 78.0–100.0)
Monocytes Absolute: 0.8 10*3/uL (ref 0.1–1.0)
Monocytes Relative: 8 %
NEUTROS PCT: 73 %
Neutro Abs: 7.6 10*3/uL (ref 1.7–7.7)
PLATELETS: 281 10*3/uL (ref 150–400)
RBC: 4.34 MIL/uL (ref 4.22–5.81)
RDW: 13 % (ref 11.5–15.5)
WBC: 10.6 10*3/uL — AB (ref 4.0–10.5)

## 2017-03-13 LAB — COMPREHENSIVE METABOLIC PANEL
ALBUMIN: 4 g/dL (ref 3.5–5.0)
ALK PHOS: 55 U/L (ref 38–126)
ALT: 17 U/L (ref 17–63)
ANION GAP: 6 (ref 5–15)
AST: 18 U/L (ref 15–41)
BUN: 23 mg/dL — AB (ref 6–20)
CO2: 29 mmol/L (ref 22–32)
Calcium: 9.2 mg/dL (ref 8.9–10.3)
Chloride: 104 mmol/L (ref 101–111)
Creatinine, Ser: 1.18 mg/dL (ref 0.61–1.24)
GFR calc non Af Amer: 54 mL/min — ABNORMAL LOW (ref >60)
GLUCOSE: 121 mg/dL — AB (ref 65–99)
POTASSIUM: 3.9 mmol/L (ref 3.5–5.1)
SODIUM: 139 mmol/L (ref 135–145)
Total Bilirubin: 0.6 mg/dL (ref 0.3–1.2)
Total Protein: 6.7 g/dL (ref 6.5–8.1)

## 2017-03-13 LAB — TROPONIN I: Troponin I: <0.03 ng/mL (ref <0.03)

## 2017-03-13 MED ORDER — HYDROCODONE-ACETAMINOPHEN 5-325 MG PO TABS: 1.0000 | ORAL_TABLET | ORAL | 0 refills | Status: DC | PRN

## 2017-03-13 MED ORDER — HYDROCODONE-ACETAMINOPHEN 5-325 MG PO TABS
2.0000 | ORAL_TABLET | Freq: Once | ORAL | Status: AC
  Administered 2017-03-13: 2 via ORAL
  Filled 2017-03-13: qty 2

## 2017-03-13 NOTE — ED Triage Notes (Signed)
Pt c/o neck pain and high blood that started this evening. Denies known injury. Pt denies dizziness, blurry vision, headache. Pt has hx of hypertension and has been compliant with medications.

## 2017-03-13 NOTE — ED Notes (Signed)
EDP aware that pt states he is feeling much better.

## 2017-03-13 NOTE — ED Provider Notes (Signed)
[facility] EMERGENCY DEPARTMENT Provider Note   CSN: [id number] Arrival date & time: 03/13/17  1819     History   Chief Complaint Chief Complaint  Patient presents with  . Hypertension  . Neck Pain    HPI Mark Brady is a 83 y.o. male.  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  States the pain is gradually increased.  There is that his blood pressure was elevated.  Denies any numbness or tingling to his hands or feet.  No focal weakness.  Denies headache or chest pain.  No shortness of breath.  No known injury Past Medical History:  Diagnosis Date  . Arthritis   . Diabetes mellitus without complication (HCC)    diet controlled  . History of elevated PSA   . Hyperlipidemia   . Hypertension   . Stroke [facility])     Patient Active Problem List   Diagnosis Date Noted  . Chest pain 09/05/2014  . Diabetes ([place]) 10/19/2012  . Cerebrovascular accident (stroke) ([place]) 09/28/2012  . Essential hypertension, benign 09/28/2012  . Hyperlipidemia LDL goal <100 09/28/2012  . Leg pain 03/25/2012  . CHEST PAIN 01/05/2009    Past Surgical History:  Procedure Laterality Date  . CATARACT EXTRACTION W/PHACO Right 01/26/2014   Procedure: CATARACT EXTRACTION PHACO AND INTRAOCULAR LENS PLACEMENT (IOC);  Surgeon: Elta Guadeloupe T. Gershon Crane, MD;  Location: [facility] ORS;  Service: Ophthalmology;  Laterality: Right;  CDE 6.53  . CATARACT EXTRACTION W/PHACO Left 02/09/2014   Procedure: CATARACT EXTRACTION PHACO AND INTRAOCULAR LENS PLACEMENT (IOC);  Surgeon: Elta Guadeloupe T. Gershon Crane, MD;  Location: [facility] ORS;  Service: Ophthalmology;  Laterality: Left;  CDE:5.58  . COLONOSCOPY    . YAG LASER APPLICATION Right 10/26/4578   Procedure: YAG LASER APPLICATION;  Surgeon: Rutherford Guys, MD;  Location: [facility] ORS;  Service: Ophthalmology;  Laterality: Right;       Home Medications    Prior to Admission  medications   Medication Sig Start Date End Date Taking? Authorizing Provider  ACCU-CHEK AVIVA PLUS test strip USE 1 STRIP TO CHECK GLUCOSE ONCE DAILY 11/05/16   Mikey Kirschner, MD  ACCU-CHEK SOFTCLIX LANCETS lancets USE AS DIRECTED ONCE DAILY 07/16/16   Mikey Kirschner, MD  aspirin 81 MG chewable tablet Chew 81 mg by mouth daily.    [provider]  HYDROcodone-acetaminophen (NORCO) 5-325 MG tablet Take 1 tablet by mouth every 4 (four) hours as needed. 03/13/17   Julianne Rice, MD  losartan (COZAAR) 50 MG tablet Take 1 tablet (50 mg total) by mouth daily. 02/06/17   Mikey Kirschner, MD  omeprazole (PRILOSEC) 20 MG capsule Take 1 capsule (20 mg total) by mouth daily. 02/06/17   Mikey Kirschner, MD  pravastatin (PRAVACHOL) 40 MG tablet TAKE 1 TABLET(40 MG) BY MOUTH DAILY 02/06/17   Mikey Kirschner, MD  vitamin C (ASCORBIC ACID) 500 MG tablet Take 1,000 mg by mouth daily.    [provider]    Family History Family History  Problem Relation Age of Onset  . Arthritis Unknown     Social History Social History   Tobacco Use  . Smoking status: Never Smoker  . Smokeless tobacco: Never Used  Substance Use Topics  . Alcohol use: No  . Drug use: No     Allergies   Patient has no known allergies.   Review of Systems Review of Systems  Constitutional: Negative for chills and fever.  Eyes: Negative for visual disturbance.  Respiratory: Negative for cough and shortness of breath.   Cardiovascular: Negative for chest pain and leg swelling.  Gastrointestinal: Negative for abdominal pain, diarrhea and nausea.  Musculoskeletal: Positive for myalgias and neck pain. Negative for back pain and neck stiffness.  Skin: Negative for rash and wound.  Neurological: Positive for numbness. Negative for dizziness, syncope, weakness and headaches.  All other systems reviewed and are negative.    Physical Exam Updated Vital Signs BP (!) 155/75   Pulse (!) 40   Temp  (!) 97.3 F (36.3 C) (Oral)   Resp 15   Ht 5\' 7"  (1.702 m)   Wt 77.1 kg (170 lb)   SpO2 97%   BMI 26.63 kg/m   Physical Exam  Constitutional: He is oriented to person, place, and time. He appears well-developed and well-nourished. No distress.  HENT:  Head: Normocephalic and atraumatic.  Mouth/Throat: Oropharynx is clear and moist. No oropharyngeal exudate.  Eyes: EOM are normal. Pupils are equal, round, and reactive to light.  Neck: Normal range of motion. Neck supple.  No posterior midline cervical tenderness to palpation.  Patient has some mild tenderness to palpation over the left lateral side of the neck.  No obvious masses.  No meningismus.  Cardiovascular: Normal rate and regular rhythm. Exam reveals no friction rub.  No murmur heard. Pulmonary/Chest: Effort normal and breath sounds normal. No stridor. No respiratory distress. He has no wheezes. He has no rales. He exhibits no tenderness.  Abdominal: Soft. Bowel sounds are normal. There is no tenderness. There is no rebound and no guarding.  Musculoskeletal: Normal range of motion. He exhibits no edema or tenderness.  No midline thoracic or lumbar tenderness.  No lower extremity swelling, asymmetry or tenderness pains.  2+ distal pulses in all extremities.  Lymphadenopathy:    He has no cervical adenopathy.  Neurological: He is alert and oriented to person, place, and time.  5/5 motor in all extremities.  Describes sensory changes in the left lateral neck but no numbness.  Skin: Skin is warm and dry. Capillary refill takes less than 2 seconds. No rash noted. He is not diaphoretic. No erythema.  Psychiatric: He has a normal mood and affect. His behavior is normal.  Nursing note and vitals reviewed.    ED Treatments / Results  Labs (all labs ordered are listed, but only abnormal results are displayed) Labs Reviewed  CBC WITH DIFFERENTIAL/PLATELET - Abnormal; Notable for the following components:      Result Value   WBC  10.6 (*)    All other components within normal limits  COMPREHENSIVE METABOLIC PANEL - Abnormal; Notable for the following components:   Glucose, Bld 121 (*)    BUN 23 (*)    GFR calc non Af Amer 54 (*)    All other components within normal limits  TROPONIN I    EKG  EKG Interpretation None       Radiology Dg Chest 2 View  Result Date: 03/13/2017 CLINICAL DATA:  Upper chest pain EXAM: CHEST  2 VIEW COMPARISON:  09/05/2014 FINDINGS: Linear scarring at the lung bases. No focal consolidation or effusion. Cardiomediastinal silhouette within normal limits. No pneumothorax. Degenerative changes of the spine. IMPRESSION: No active cardiopulmonary disease.  Scarring at the lung bases Electronically Signed   By: Donavan Foil M.D.   On: 03/13/2017 19:13    Procedures Procedures (including critical care time)  Medications Ordered in ED  Medications  HYDROcodone-acetaminophen (NORCO/VICODIN) 5-325 MG per tablet 2 tablet (2 tablets Oral Given 03/13/17 1919)     Initial Impression / Assessment and Plan / ED Course  I have reviewed the triage vital signs and the nursing notes.  Pertinent labs & imaging results that were available during my care of the patient were reviewed by me and considered in my medical decision making (see chart for details).     Suspect this is likely exacerbation of the patient's known degenerative disc disease and cervical arthritis.  Will screen for more insidious causes of his symptoms. His neck pain is resolved.  Blood pressure has significantly improved.  Workup without acute abnormality.  Advised to follow-up closely with his primary physician.  Return precautions given. Final Clinical Impressions(s) / ED Diagnoses   Final diagnoses:  Left cervical radiculopathy  Hypertension, unspecified type    ED Discharge Orders        Ordered    HYDROcodone-acetaminophen (NORCO) 5-325 MG tablet  Every 4 hours PRN     03/13/17 2055       Julianne Rice,  MD 03/13/17 2111

## 2017-03-15 ENCOUNTER — Encounter: Payer: Self-pay | Admitting: Family Medicine

## 2017-03-15 ENCOUNTER — Other Ambulatory Visit: Payer: Self-pay | Admitting: *Deleted

## 2017-03-15 ENCOUNTER — Ambulatory Visit (INDEPENDENT_AMBULATORY_CARE_PROVIDER_SITE_OTHER): Payer: Medicare Other | Admitting: Family Medicine

## 2017-03-15 VITALS — BP 178/102 | Ht 66.5 in | Wt 173.1 lb

## 2017-03-15 DIAGNOSIS — S161XXA Strain of muscle, fascia and tendon at neck level, initial encounter: Secondary | ICD-10-CM

## 2017-03-15 DIAGNOSIS — M542 Cervicalgia: Secondary | ICD-10-CM | POA: Diagnosis not present

## 2017-03-15 DIAGNOSIS — I10 Essential (primary) hypertension: Secondary | ICD-10-CM | POA: Diagnosis not present

## 2017-03-15 MED ORDER — LOSARTAN POTASSIUM 50 MG PO TABS: ORAL_TABLET | ORAL | 1 refills | Status: DC

## 2017-03-15 NOTE — Progress Notes (Signed)
   Subjective:    Patient ID: Mark Brady, male    DOB: Jun 03, 1929, 83 y.o.   MRN: [id number] Patient arrives with numerous concerns HPI Patient is here today to follow up on ed visit from 03/13/2017 . He states he started having some Tingling and burning in left side of neck, so he went to the ed. The Ed told him it was probably the bone spurs that caused that sensation.  Complete hospital record reviewed in presence of patient.  Presented with left lateral neck tingling and pain.  Was concerned about potential for stroke.  Seen in the emergency room.  Felt to be localized cervical nerve pain and not any central process.  Blood pressure noted to be elevated substantially at visit.  Family has blood pressures all reviewed most numbers somewhat elevated.   Pt has had some issues with HTN. He is on Losartan 50 mg one daily. Orthostatic BP's done.  Review of Systems No headache, no major weight loss or weight gain, no chest pain no back pain abdominal pain no change in bowel habits complete ROS otherwise negative     Objective:   Physical Exam  Blood pressure 152/88 on repeat  Alert and oriented, vitals reviewed and stable, NAD ENT-TM's and ext canals WNL bilat via otoscopic exam Soft palate, tonsils and post pharynx WNL via oropharyngeal exam Neck-symmetric, no masses; thyroid nonpalpable and nontender Pulmonary-no tachypnea or accessory muscle use; Clear without wheezes via auscultation Card--no abnrml murmurs, rhythm reg and rate WNL Carotid pulses symmetric, without bruits       Assessment & Plan:  Impression 1 hypertension.  Suboptimal control.  Long discussion held.  Patient reluctant to increase medicine substantial.  Will increase losartan to one half 50 mg tablets daily.  2.  Paracervical pain tenderness paresthesias likely local to secondary nerve root irritation and/or cervical strain and chance of central stroke etc. causing this virtually 0.  Discussed at  length.  Greater than 50% of this 25 minute face to face visit was spent in counseling and discussion and coordination of care regarding the above diagnosis/diagnosies

## 2017-03-29 DIAGNOSIS — L568 Other specified acute skin changes due to ultraviolet radiation: Secondary | ICD-10-CM | POA: Diagnosis not present

## 2017-03-29 DIAGNOSIS — H401121 Primary open-angle glaucoma, left eye, mild stage: Secondary | ICD-10-CM | POA: Diagnosis not present

## 2017-03-29 DIAGNOSIS — H401113 Primary open-angle glaucoma, right eye, severe stage: Secondary | ICD-10-CM | POA: Diagnosis not present

## 2017-05-01 ENCOUNTER — Telehealth: Payer: Self-pay | Admitting: Family Medicine

## 2017-05-01 NOTE — Telephone Encounter (Signed)
Pt is requesting lab orders to be sent over for an upcoming appt. Last labs per epic were: microalbumin,a1c,bmp,hepatic,and lipid on 01/31/17.

## 2017-05-01 NOTE — Telephone Encounter (Signed)
Only A1c and in office

## 2017-05-01 NOTE — Telephone Encounter (Signed)
Discussed with pt. Pt verbalized understanding.  °

## 2017-05-09 ENCOUNTER — Encounter: Payer: Medicare Other | Admitting: Family Medicine

## 2017-05-13 ENCOUNTER — Encounter: Payer: Self-pay | Admitting: Family Medicine

## 2017-05-13 ENCOUNTER — Ambulatory Visit (INDEPENDENT_AMBULATORY_CARE_PROVIDER_SITE_OTHER): Payer: Medicare Other | Admitting: Family Medicine

## 2017-05-13 VITALS — BP 158/88 | Ht 66.5 in | Wt 172.0 lb

## 2017-05-13 DIAGNOSIS — I10 Essential (primary) hypertension: Secondary | ICD-10-CM

## 2017-05-13 DIAGNOSIS — Z0000 Encounter for general adult medical examination without abnormal findings: Secondary | ICD-10-CM | POA: Diagnosis not present

## 2017-05-13 DIAGNOSIS — E785 Hyperlipidemia, unspecified: Secondary | ICD-10-CM | POA: Diagnosis not present

## 2017-05-13 DIAGNOSIS — E119 Type 2 diabetes mellitus without complications: Secondary | ICD-10-CM

## 2017-05-13 LAB — POCT GLYCOSYLATED HEMOGLOBIN (HGB A1C): Hemoglobin A1C: 6.4

## 2017-05-13 MED ORDER — OMEPRAZOLE 20 MG PO CPDR: 20.0000 mg | DELAYED_RELEASE_CAPSULE | Freq: Every day | ORAL | 1 refills | Status: DC

## 2017-05-13 MED ORDER — LOSARTAN POTASSIUM 100 MG PO TABS: 100.0000 mg | ORAL_TABLET | Freq: Every day | ORAL | 1 refills | Status: DC

## 2017-05-13 MED ORDER — PRAVASTATIN SODIUM 40 MG PO TABS: ORAL_TABLET | ORAL | 1 refills | Status: DC

## 2017-05-13 NOTE — Progress Notes (Signed)
Subjective:    Patient ID: Mark Brady, male    DOB: 09/08/1929, 84 y.o.   MRN: [id number]  HPI The patient comes in today for a wellness visit.    A review of their health history was completed.  A review of medications was also completed.  Any needed refills; No  Eating habits: Good  Falls/  MVA accidents in past few months: None  Regular exercise: Yes  Specialist pt sees on regular basis: Eye Dr.  Preventative health issues were discussed.   Additional concerns: Questions regarding Vit c   Results for orders placed or performed in visit on 05/13/17  POCT glycosylated hemoglobin (Hb A1C)  Result Value Ref Range   Hemoglobin A1C 6.4    bp 140 to 160  Taking the one and a half htn tab ea morn  Blood pressure medicine and blood pressure levels reviewed today with patient. Compliant with blood pressure medicine. States does not miss a dose. No obvious side effects. Blood pressure generally good when checked elsewhere. Watching salt intake.  Recent Results (from the past 2160 hour(s))  CBC with Differential/Platelet     Status: Abnormal   Collection Time: 03/13/17  7:35 PM  Result Value Ref Range   WBC 10.6 (H) 4.0 - 10.5 K/uL   RBC 4.34 4.22 - 5.81 MIL/uL   Hemoglobin 13.6 13.0 - 17.0 g/dL   HCT 41.1 39.0 - 52.0 %   MCV 94.7 78.0 - 100.0 fL   MCH 31.3 26.0 - 34.0 pg   MCHC 33.1 30.0 - 36.0 g/dL   RDW 13.0 11.5 - 15.5 %   Platelets 281 150 - 400 K/uL   Neutrophils Relative % 73 %   Neutro Abs 7.6 1.7 - 7.7 K/uL   Lymphocytes Relative 15 %   Lymphs Abs 1.6 0.7 - 4.0 K/uL   Monocytes Relative 8 %   Monocytes Absolute 0.8 0.1 - 1.0 K/uL   Eosinophils Relative 4 %   Eosinophils Absolute 0.5 0.0 - 0.7 K/uL   Basophils Relative 0 %   Basophils Absolute 0.0 0.0 - 0.1 K/uL  Comprehensive metabolic panel     Status: Abnormal   Collection Time: 03/13/17  7:35 PM  Result Value Ref Range   Sodium 139 135 - 145 mmol/L   Potassium 3.9 3.5 - 5.1 mmol/L   Chloride 104 101 - 111 mmol/L   CO2 29 22 - 32 mmol/L   Glucose, Bld 121 (H) 65 - 99 mg/dL   BUN 23 (H) 6 - 20 mg/dL   Creatinine, Ser 1.18 0.61 - 1.24 mg/dL   Calcium 9.2 8.9 - 10.3 mg/dL   Total Protein 6.7 6.5 - 8.1 g/dL   Albumin 4.0 3.5 - 5.0 g/dL   AST 18 15 - 41 U/L   ALT 17 17 - 63 U/L   Alkaline Phosphatase 55 38 - 126 U/L   Total Bilirubin 0.6 0.3 - 1.2 mg/dL   GFR calc non Af Amer 54 (L) >60 mL/min   GFR calc Af Amer >60 >60 mL/min    Comment: (NOTE) The eGFR has been calculated using the CKD EPI equation. This calculation has not been validated in all clinical situations. eGFR's persistently <60 mL/min signify possible Chronic Kidney Disease.    Anion gap 6 5 - 15  Troponin I     Status: None   Collection Time: 03/13/17  7:35 PM  Result Value Ref Range   Troponin I <0.03 <0.03 ng/mL  POCT glycosylated hemoglobin (Hb  A1C)     Status: None   Collection Time: 05/13/17  9:52 AM  Result Value Ref Range   Hemoglobin A1C 6.4    .lip Patient continues to take lipid medication regularly. No obvious side effects from it. Generally does not miss a dose. Prior blood work results are reviewed with patient. Patient continues to work on fat intake in diet  Patient claims compliance with diabetes medication. No obvious side effects. Reports no substantial low sugar spells. Most numbers are generally in good range when checked fasting. Generally does not miss a dose of medication. Watching diabetic diet closely     Review of Systems  Constitutional: Negative for activity change, appetite change and fever.  HENT: Negative for congestion and rhinorrhea.   Eyes: Negative for discharge.  Respiratory: Negative for cough and wheezing.   Cardiovascular: Negative for chest pain.  Gastrointestinal: Negative for abdominal pain, blood in stool and vomiting.  Genitourinary: Negative for difficulty urinating and frequency.  Musculoskeletal: Negative for neck pain.  Skin: Negative for  rash.  Allergic/Immunologic: Negative for environmental allergies and food allergies.  Neurological: Negative for weakness and headaches.  Psychiatric/Behavioral: Negative for agitation.  All other systems reviewed and are negative.     Patient is here today to follow up on DM. He is taking nothing for Dm just regulates through diet. Results for orders placed or performed in visit on 05/13/17  POCT glycosylated hemoglobin (Hb A1C)  Result Value Ref Range   Hemoglobin A1C 6.4     Objective:   Physical Exam  Constitutional: He appears well-developed and well-nourished.  HENT:  Head: Normocephalic and atraumatic.  Right Ear: External ear normal.  Left Ear: External ear normal.  Nose: Nose normal.  Mouth/Throat: Oropharynx is clear and moist.  Eyes: Right eye exhibits no discharge. Left eye exhibits no discharge. No scleral icterus.  Neck: Normal range of motion. Neck supple. No thyromegaly present.  Cardiovascular: Normal rate, regular rhythm and normal heart sounds.  No murmur heard. Pulmonary/Chest: Effort normal and breath sounds normal. No respiratory distress. He has no wheezes.  Abdominal: Soft. Bowel sounds are normal. He exhibits no distension and no mass. There is no tenderness.  Genitourinary: Penis normal.  Musculoskeletal: Normal range of motion. He exhibits no edema.  Lymphadenopathy:    He has no cervical adenopathy.  Neurological: He is alert. He exhibits normal muscle tone. Coordination normal.  Skin: Skin is warm and dry. No erythema.  Psychiatric: He has a normal mood and affect. His behavior is normal. Judgment normal.          Assessment & Plan:  Impression 1 wellness exam diet discussed.  Exercise discussed.  Vaccines discussed and clarified.  2.  Hypertension good control discussed to maintain same compliance discussed  3.  Hyperlipidemia blood work reviewed.  To maintain same dose discussed  4.  Type 2 diabetes ongoing good control sugars  compliance with interventions discussed  5.  History of stroke.  No new neurological abnormalities  Medications refilled.

## 2017-07-02 DIAGNOSIS — H401121 Primary open-angle glaucoma, left eye, mild stage: Secondary | ICD-10-CM | POA: Diagnosis not present

## 2017-07-02 DIAGNOSIS — H401113 Primary open-angle glaucoma, right eye, severe stage: Secondary | ICD-10-CM | POA: Diagnosis not present

## 2017-08-01 ENCOUNTER — Encounter: Payer: Self-pay | Admitting: Family Medicine

## 2017-08-01 ENCOUNTER — Ambulatory Visit (INDEPENDENT_AMBULATORY_CARE_PROVIDER_SITE_OTHER): Payer: Medicare Other | Admitting: Family Medicine

## 2017-08-01 VITALS — Temp 97.6°F | Ht 66.5 in | Wt 172.0 lb

## 2017-08-01 DIAGNOSIS — R21 Rash and other nonspecific skin eruption: Secondary | ICD-10-CM | POA: Diagnosis not present

## 2017-08-01 MED ORDER — CEPHALEXIN 500 MG PO CAPS: 500.0000 mg | ORAL_CAPSULE | Freq: Three times a day (TID) | ORAL | 0 refills | Status: DC

## 2017-08-01 NOTE — Progress Notes (Signed)
   Subjective:    Patient ID: Mark Brady, male    DOB: Mar 29, 1930, 84 y.o.   MRN: [id number]  HPI  Patient arrives with sore area on his bottom that does not seem to be healing.  Tried slave plus prior prescription , did not help much   Pos pain , but not extreme  No fever no chills  Review of Systems No headache, no major weight loss or weight gain, no chest pain no back pain abdominal pain no change in bowel habits complete ROS otherwise negative     Objective:   Physical Exam   Alert vitals stable, NAD. Blood pressure good on repeat. HEENT normal. Lungs clear. Heart regular rate and rhythm.   Small.  Rectal red slightly tender prominence     Assessment & Plan:  Impression skin tissue infection plan antibiotics prescribed local measures discussed the persist may try something like DuoDERM  Greater than 50% of this 15 minute face to face visit was spent in counseling and discussion and coordination of care regarding the above diagnosis/diagnosies

## 2017-08-01 NOTE — Patient Instructions (Signed)
duoderm patches can apply to sore area on the skin whenever pressure may be a contributing factor, change every three days

## 2017-08-02 ENCOUNTER — Encounter: Payer: Self-pay | Admitting: Family Medicine

## 2017-10-03 DIAGNOSIS — H401113 Primary open-angle glaucoma, right eye, severe stage: Secondary | ICD-10-CM | POA: Diagnosis not present

## 2017-11-06 ENCOUNTER — Ambulatory Visit (INDEPENDENT_AMBULATORY_CARE_PROVIDER_SITE_OTHER): Payer: Medicare Other | Admitting: Family Medicine

## 2017-11-06 ENCOUNTER — Encounter: Payer: Self-pay | Admitting: Family Medicine

## 2017-11-06 VITALS — BP 146/88 | Ht 66.5 in | Wt 167.6 lb

## 2017-11-06 DIAGNOSIS — E785 Hyperlipidemia, unspecified: Secondary | ICD-10-CM | POA: Diagnosis not present

## 2017-11-06 DIAGNOSIS — R490 Dysphonia: Secondary | ICD-10-CM | POA: Diagnosis not present

## 2017-11-06 DIAGNOSIS — I10 Essential (primary) hypertension: Secondary | ICD-10-CM | POA: Diagnosis not present

## 2017-11-06 DIAGNOSIS — E119 Type 2 diabetes mellitus without complications: Secondary | ICD-10-CM

## 2017-11-06 LAB — POCT GLYCOSYLATED HEMOGLOBIN (HGB A1C): HEMOGLOBIN A1C: 5.6 % (ref 4.0–5.6)

## 2017-11-06 MED ORDER — OMEPRAZOLE 20 MG PO CPDR: 20.0000 mg | DELAYED_RELEASE_CAPSULE | Freq: Every day | ORAL | 1 refills | Status: DC

## 2017-11-06 MED ORDER — LOSARTAN POTASSIUM 100 MG PO TABS: 100.0000 mg | ORAL_TABLET | Freq: Every day | ORAL | 1 refills | Status: DC

## 2017-11-06 MED ORDER — PRAVASTATIN SODIUM 40 MG PO TABS: ORAL_TABLET | ORAL | 1 refills | Status: DC

## 2017-11-06 MED ORDER — GLUCOSE BLOOD VI STRP: ORAL_STRIP | 5 refills | Status: DC

## 2017-11-06 MED ORDER — ACCU-CHEK SOFTCLIX LANCETS MISC: 5 refills | Status: DC

## 2017-11-06 NOTE — Progress Notes (Signed)
   Subjective:    Patient ID: Mark Brady, male    DOB: [birth year], 84 y.o.   MRN: [id number]  Diabetes  He presents for his follow-up diabetic visit. He has type 2 diabetes mellitus. There are no hypoglycemic associated symptoms. There are no diabetic associated symptoms. There are no hypoglycemic complications. There are no diabetic complications.  Hyperlipidemia  This is a chronic problem. There are no compliance problems.   Hypertension  This is a chronic problem. There are no compliance problems.    Results for orders placed or performed in visit on 11/06/17  POCT HgB A1C  Result Value Ref Range   Hemoglobin A1C 5.6 4.0 - 5.6 %   HbA1c POC (<> result, manual entry)  4.0 - 5.6 %   HbA1c, POC (prediabetic range)  5.7 - 6.4 %   HbA1c, POC (controlled diabetic range)  0.0 - 7.0 %    Patient claims compliance with diabetes medication. No obvious side effects. Reports no substantial low sugar spells. Most numbers are generally in good range when checked fasting. Generally does not miss a dose of medication. Watching diabetic diet closely  Blood pressure medicine and blood pressure levels reviewed today with patient. Compliant with blood pressure medicine. States does not miss a dose. No obvious side effects. Blood pressure generally good when checked elsewhere. Watching salt intake.   Patient continues to take lipid medication regularly. No obvious side effects from it. Generally does not miss a dose. Prior blood work results are reviewed with patient. Patient continues to work on fat intake in diet  Patient has noted progressive weakening of voice over the past year.  He calls it hoarseness.  Worse in the last 6 months.  Occasional reflux symptoms but generally under good control with current omeprazole Review of Systems No headache, no major weight loss or weight gain, no chest pain no back pain abdominal pain no change in bowel habits complete ROS otherwise negative       Objective:   Physical Exam  Alert and oriented, vitals reviewed and stable, NAD ENT-TM's and ext canals WNL bilat via otoscopic exam Soft palate, tonsils and post pharynx WNL via oropharyngeal exam Neck-symmetric, no masses; thyroid nonpalpable and nontender Pulmonary-no tachypnea or accessory muscle use; Clear without wheezes via auscultation Card--no abnrml murmurs, rhythm reg and rate WNL Carotid pulses symmetric, without bruits Impression      Assessment & Plan:  Impression type 2 diabetes.  Good control.  Discussed.  Maintain same approach.  Diet discussed  2.  Hypertension.  Good control.  Discussed.  To maintain same meds and dose compliance discussed  3.  Hyperlipidemia.  Prior blood work reviewed until reviewed.  Medications refilled.  Compliance discussed  4.  Progressive hoarseness.  By my ear he sounds to have more of a elderly fading voice as opposed to true hoarseness, however I will honor his concern and press on with ENT referral.

## 2017-11-12 ENCOUNTER — Ambulatory Visit: Payer: Medicare Other | Admitting: Family Medicine

## 2017-11-13 ENCOUNTER — Telehealth: Payer: Self-pay | Admitting: Family Medicine

## 2017-11-13 NOTE — Telephone Encounter (Signed)
Need 11/06/17 OV note completed to send with ENT referral

## 2017-11-17 NOTE — Telephone Encounter (Signed)
done

## 2018-01-04 ENCOUNTER — Other Ambulatory Visit: Payer: Self-pay | Admitting: Family Medicine

## 2018-01-28 LAB — HM DIABETES EYE EXAM

## 2018-02-03 ENCOUNTER — Ambulatory Visit (INDEPENDENT_AMBULATORY_CARE_PROVIDER_SITE_OTHER): Payer: Medicare Other

## 2018-02-03 DIAGNOSIS — Z23 Encounter for immunization: Secondary | ICD-10-CM

## 2018-02-04 DIAGNOSIS — H401113 Primary open-angle glaucoma, right eye, severe stage: Secondary | ICD-10-CM | POA: Diagnosis not present

## 2018-03-07 ENCOUNTER — Other Ambulatory Visit: Payer: Self-pay | Admitting: Family Medicine

## 2018-04-21 ENCOUNTER — Telehealth: Payer: Self-pay | Admitting: Family Medicine

## 2018-04-21 DIAGNOSIS — E785 Hyperlipidemia, unspecified: Secondary | ICD-10-CM

## 2018-04-21 DIAGNOSIS — I10 Essential (primary) hypertension: Secondary | ICD-10-CM

## 2018-04-21 DIAGNOSIS — E119 Type 2 diabetes mellitus without complications: Secondary | ICD-10-CM

## 2018-04-21 DIAGNOSIS — Z79899 Other long term (current) drug therapy: Secondary | ICD-10-CM

## 2018-04-21 NOTE — Telephone Encounter (Signed)
Orders put in. Pt's wife notified.

## 2018-04-21 NOTE — Telephone Encounter (Signed)
Same as oct 18

## 2018-04-21 NOTE — Telephone Encounter (Signed)
Pt's wife stopped by office to see if Dr. Richardson Landry would like to order lab work before pts appt on 05/13/18.

## 2018-04-21 NOTE — Telephone Encounter (Signed)
Last labs 7/19- HgbA1c                  10/18 Lipid, Liver, Met 7, HgbA1c, Microalbumin Urine

## 2018-04-30 DIAGNOSIS — E119 Type 2 diabetes mellitus without complications: Secondary | ICD-10-CM | POA: Diagnosis not present

## 2018-04-30 DIAGNOSIS — E785 Hyperlipidemia, unspecified: Secondary | ICD-10-CM | POA: Diagnosis not present

## 2018-04-30 DIAGNOSIS — Z79899 Other long term (current) drug therapy: Secondary | ICD-10-CM | POA: Diagnosis not present

## 2018-04-30 DIAGNOSIS — I10 Essential (primary) hypertension: Secondary | ICD-10-CM | POA: Diagnosis not present

## 2018-05-01 LAB — BASIC METABOLIC PANEL
BUN/Creatinine Ratio: 13 (ref 10–24)
BUN: 14 mg/dL (ref 8–27)
CALCIUM: 9.4 mg/dL (ref 8.6–10.2)
CO2: 24 mmol/L (ref 20–29)
Chloride: 99 mmol/L (ref 96–106)
Creatinine, Ser: 1.07 mg/dL (ref 0.76–1.27)
GFR calc Af Amer: 72 mL/min/{1.73_m2} (ref >59)
GFR, EST NON AFRICAN AMERICAN: 63 mL/min/{1.73_m2} (ref >59)
GLUCOSE: 127 mg/dL — AB (ref 65–99)
Potassium: 5.2 mmol/L (ref 3.5–5.2)
Sodium: 139 mmol/L (ref 134–144)

## 2018-05-01 LAB — LIPID PANEL
CHOL/HDL RATIO: 2.3 ratio (ref 0.0–5.0)
Cholesterol, Total: 157 mg/dL (ref 100–199)
HDL: 68 mg/dL (ref >39)
LDL Calculated: 73 mg/dL (ref 0–99)
Triglycerides: 80 mg/dL (ref 0–149)
VLDL CHOLESTEROL CAL: 16 mg/dL (ref 5–40)

## 2018-05-01 LAB — HEMOGLOBIN A1C
ESTIMATED AVERAGE GLUCOSE: 140 mg/dL
Hgb A1c MFr Bld: 6.5 % — ABNORMAL HIGH (ref 4.8–5.6)

## 2018-05-01 LAB — MICROALBUMIN / CREATININE URINE RATIO
CREATININE, UR: 60.6 mg/dL
MICROALB/CREAT RATIO: 20 mg/g{creat} (ref 0.0–30.0)
MICROALBUM., U, RANDOM: 12.1 ug/mL

## 2018-05-01 LAB — HEPATIC FUNCTION PANEL
ALBUMIN: 4.1 g/dL (ref 3.5–4.7)
ALT: 13 IU/L (ref 0–44)
AST: 12 IU/L (ref 0–40)
Alkaline Phosphatase: 64 IU/L (ref 39–117)
Bilirubin Total: 0.4 mg/dL (ref 0.0–1.2)
Bilirubin, Direct: 0.13 mg/dL (ref 0.00–0.40)
TOTAL PROTEIN: 6.6 g/dL (ref 6.0–8.5)

## 2018-05-13 ENCOUNTER — Encounter: Payer: Self-pay | Admitting: Family Medicine

## 2018-05-13 ENCOUNTER — Ambulatory Visit (INDEPENDENT_AMBULATORY_CARE_PROVIDER_SITE_OTHER): Payer: Medicare Other | Admitting: Family Medicine

## 2018-05-13 VITALS — BP 142/86 | Ht 66.5 in | Wt 170.0 lb

## 2018-05-13 DIAGNOSIS — E785 Hyperlipidemia, unspecified: Secondary | ICD-10-CM

## 2018-05-13 DIAGNOSIS — Z0000 Encounter for general adult medical examination without abnormal findings: Secondary | ICD-10-CM | POA: Diagnosis not present

## 2018-05-13 DIAGNOSIS — I10 Essential (primary) hypertension: Secondary | ICD-10-CM

## 2018-05-13 DIAGNOSIS — E119 Type 2 diabetes mellitus without complications: Secondary | ICD-10-CM

## 2018-05-13 MED ORDER — PRAVASTATIN SODIUM 40 MG PO TABS: ORAL_TABLET | ORAL | 1 refills | Status: DC

## 2018-05-13 MED ORDER — LOSARTAN POTASSIUM 100 MG PO TABS: 100.0000 mg | ORAL_TABLET | Freq: Every day | ORAL | 1 refills | Status: DC

## 2018-05-13 MED ORDER — OMEPRAZOLE 20 MG PO CPDR: 20.0000 mg | DELAYED_RELEASE_CAPSULE | Freq: Every day | ORAL | 1 refills | Status: DC

## 2018-05-13 NOTE — Progress Notes (Signed)
Subjective:    Patient ID: Mark Brady, male    DOB: February 03, 1930, 85 y.o.   MRN: [id number]  HPI  The patient comes in today for a wellness visit.    A review of their health history was completed.  A review of medications was also completed.  Any needed refills; yes  Eating habits: eating good  Falls/  MVA accidents in past few months: none  Regular exercise: walking  Specialist pt sees on regular basis: eye doctor  Preventative health issues were discussed.   Additional concerns: discuss recent lab work  Results for orders placed or performed in visit on 04/21/18  Lipid panel  Result Value Ref Range   Cholesterol, Total 157 100 - 199 mg/dL   Triglycerides 80 0 - 149 mg/dL   HDL 68 >39 mg/dL   VLDL Cholesterol Cal 16 5 - 40 mg/dL   LDL Calculated 73 0 - 99 mg/dL   Chol/HDL Ratio 2.3 0.0 - 5.0 ratio  Hepatic function panel  Result Value Ref Range   Total Protein 6.6 6.0 - 8.5 g/dL   Albumin 4.1 3.5 - 4.7 g/dL   Bilirubin Total 0.4 0.0 - 1.2 mg/dL   Bilirubin, Direct 0.13 0.00 - 0.40 mg/dL   Alkaline Phosphatase 64 39 - 117 IU/L   AST 12 0 - 40 IU/L   ALT 13 0 - 44 IU/L  Basic metabolic panel  Result Value Ref Range   Glucose 127 (H) 65 - 99 mg/dL   BUN 14 8 - 27 mg/dL   Creatinine, Ser 1.07 0.76 - 1.27 mg/dL   GFR calc non Af Amer 63 >59 mL/min/1.73   GFR calc Af Amer 72 >59 mL/min/1.73   BUN/Creatinine Ratio 13 10 - 24   Sodium 139 134 - 144 mmol/L   Potassium 5.2 3.5 - 5.2 mmol/L   Chloride 99 96 - 106 mmol/L   CO2 24 20 - 29 mmol/L   Calcium 9.4 8.6 - 10.2 mg/dL  Hemoglobin A1c  Result Value Ref Range   Hgb A1c MFr Bld 6.5 (H) 4.8 - 5.6 %   Est. average glucose Bld gHb Est-mCnc 140 mg/dL  Microalbumin / creatinine urine ratio  Result Value Ref Range   Creatinine, Urine 60.6 Not Estab. mg/dL   Microalbumin, Urine 12.1 Not Estab. ug/mL   Microalb/Creat Ratio 20.0 0.0 - 30.0 mg/g creat   Patient claims compliance with diabetes medication. No  obvious side effects. Reports no substantial low sugar spells. Most numbers are generally in good range when checked fasting. Generally does not miss a dose of medication. Watching diabetic diet closely  Blood pressure medicine and blood pressure levels reviewed today with patient. Compliant with blood pressure medicine. States does not miss a dose. No obvious side effects. Blood pressure generally good when checked elsewhere. Watching salt intake.   Patient continues to take lipid medication regularly. No obvious side effects from it. Generally does not miss a dose. Prior blood work results are reviewed with patient. Patient continues to work on fat intake in diet  hhx of stroke, no recent symtoms   Makes tea with spenda, and mostly water, avoiding sugar drinks  Review of Systems  Constitutional: Negative for activity change, appetite change and fever.  HENT: Negative for congestion and rhinorrhea.   Eyes: Negative for discharge.  Respiratory: Negative for cough and wheezing.   Cardiovascular: Negative for chest pain.  Gastrointestinal: Negative for abdominal pain, blood in stool and vomiting.  Genitourinary: Negative for difficulty urinating and frequency.  Musculoskeletal: Negative for neck pain.  Skin: Negative for rash.  Allergic/Immunologic: Negative for environmental allergies and food allergies.  Neurological: Negative for weakness and headaches.  Psychiatric/Behavioral: Negative for agitation.  All other systems reviewed and are negative.      Objective:   Physical Exam Vitals signs reviewed.  Constitutional:      Appearance: He is well-developed.  HENT:     Head: Normocephalic and atraumatic.     Right Ear: External ear normal.     Left Ear: External ear normal.     Nose: Nose normal.  Eyes:     Pupils: Pupils are equal, round, and reactive to light.  Neck:     Musculoskeletal: Normal range of motion and neck supple.     Thyroid: No thyromegaly.  Cardiovascular:     Rate and Rhythm: Normal rate and regular rhythm.     Heart sounds: Normal heart sounds. No murmur.  Pulmonary:     Effort: Pulmonary effort is normal. No respiratory distress.     Breath sounds: Normal breath sounds. No wheezing.  Abdominal:     General: Bowel sounds are normal. There is no distension.     Palpations:  Abdomen is soft. There is no mass.     Tenderness: There is no abdominal tenderness.  Genitourinary:    Penis: Normal.      Comments: Prostate exam within normal limits Musculoskeletal: Normal range of motion.  Lymphadenopathy:     Cervical: No cervical adenopathy.  Skin:    General: Skin is warm and dry.     Findings: No erythema.  Neurological:     Mental Status: He is alert.     Motor: No abnormal muscle tone.  Psychiatric:        Behavior: Behavior normal.        Judgment: Judgment normal.    See diabetic foot exam  No focal neurological deficits       Assessment & Plan:  Impression 1 wellness exam.  Up-to-date on vaccinations.  Diet discussed exercise discussed.  2.  Hypertension.  Good control repeat compliance discussed maintain same meds  3.  Type 2 diabetes.  A1c excellent discussed maintain same diet  4.  Hyperlipidemia.  LDL good control discussed to maintain same  5.  History of stroke.  Clinically stable.  No new symptomatology  Medications refilled.  Diet exercise discussed.  Follow-up in 6 months

## 2018-05-19 DIAGNOSIS — H401121 Primary open-angle glaucoma, left eye, mild stage: Secondary | ICD-10-CM | POA: Diagnosis not present

## 2018-06-02 DIAGNOSIS — H401121 Primary open-angle glaucoma, left eye, mild stage: Secondary | ICD-10-CM | POA: Diagnosis not present

## 2018-06-24 ENCOUNTER — Telehealth: Payer: Self-pay | Admitting: Family Medicine

## 2018-06-24 ENCOUNTER — Encounter: Payer: Self-pay | Admitting: Family Medicine

## 2018-06-24 ENCOUNTER — Ambulatory Visit (INDEPENDENT_AMBULATORY_CARE_PROVIDER_SITE_OTHER): Payer: Medicare Other | Admitting: Family Medicine

## 2018-06-24 VITALS — BP 132/90 | Temp 97.9°F | Ht 66.5 in | Wt 169.6 lb

## 2018-06-24 DIAGNOSIS — R21 Rash and other nonspecific skin eruption: Secondary | ICD-10-CM | POA: Diagnosis not present

## 2018-06-24 MED ORDER — HYDROCORTISONE 2.5 % EX CREA: TOPICAL_CREAM | Freq: Two times a day (BID) | CUTANEOUS | 2 refills | Status: DC

## 2018-06-24 MED ORDER — HYDROXYZINE HCL 10 MG PO TABS: 10.0000 mg | ORAL_TABLET | Freq: Three times a day (TID) | ORAL | 2 refills | Status: DC | PRN

## 2018-06-24 NOTE — Telephone Encounter (Signed)
LMOM to give patient dermatology appointment information   Wednesday 06/25/2018 arrive 9:45am to see Dr. Denna Haggard  [address] [place] Ph# [phone]

## 2018-06-24 NOTE — Progress Notes (Signed)
   Subjective:    Patient ID: Mark Brady, male    DOB: [birth year], 85 y.o.   MRN: [id number]  Rash  This is a new problem. Episode onset: one and a half weeks. Location: from head down to mid thigh. The rash is characterized by redness and itchiness. Treatments tried: calamine lotion, benadryl.   Patient notes the rash is been considerably aggravating.  1-1/2 weeks duration.  I first thought it might be shingles.  Very itchy at times.  No new medications.  No new exposures   Review of Systems  Skin: Positive for rash.       Objective:   Physical Exam   Alert vitals stable, NAD. Blood pressure good on repeat. HEENT normal. Lungs clear. Heart regular rate and rhythm. Impressive rash multiple plaques some with raised advanced edges not true hive-like in appearance.     Assessment & Plan:  Impression atypical rash.  Etiology unclear.  Aggravating to patient.  Will initiate topical hydrocortisone along with as needed low-dose hydroxyzine.  Dermatology referral

## 2018-06-25 DIAGNOSIS — T7840XA Allergy, unspecified, initial encounter: Secondary | ICD-10-CM | POA: Diagnosis not present

## 2018-06-25 DIAGNOSIS — L309 Dermatitis, unspecified: Secondary | ICD-10-CM | POA: Diagnosis not present

## 2018-06-25 DIAGNOSIS — D485 Neoplasm of uncertain behavior of skin: Secondary | ICD-10-CM | POA: Diagnosis not present

## 2018-11-04 ENCOUNTER — Encounter: Payer: Self-pay | Admitting: Family Medicine

## 2018-11-04 ENCOUNTER — Ambulatory Visit (INDEPENDENT_AMBULATORY_CARE_PROVIDER_SITE_OTHER): Payer: Medicare Other | Admitting: Family Medicine

## 2018-11-04 ENCOUNTER — Other Ambulatory Visit: Payer: Self-pay

## 2018-11-04 DIAGNOSIS — E119 Type 2 diabetes mellitus without complications: Secondary | ICD-10-CM

## 2018-11-04 DIAGNOSIS — I10 Essential (primary) hypertension: Secondary | ICD-10-CM

## 2018-11-04 DIAGNOSIS — E785 Hyperlipidemia, unspecified: Secondary | ICD-10-CM | POA: Diagnosis not present

## 2018-11-04 MED ORDER — LOSARTAN POTASSIUM 100 MG PO TABS: 100.0000 mg | ORAL_TABLET | Freq: Every day | ORAL | 1 refills | Status: DC

## 2018-11-04 MED ORDER — OMEPRAZOLE 20 MG PO CPDR: 20.0000 mg | DELAYED_RELEASE_CAPSULE | Freq: Every day | ORAL | 1 refills | Status: DC

## 2018-11-04 MED ORDER — PRAVASTATIN SODIUM 40 MG PO TABS: ORAL_TABLET | ORAL | 1 refills | Status: DC

## 2018-11-04 MED ORDER — AMLODIPINE BESYLATE 2.5 MG PO TABS: 2.5000 mg | ORAL_TABLET | Freq: Every day | ORAL | 1 refills | Status: DC

## 2018-11-04 NOTE — Progress Notes (Signed)
   Subjective:  Audio only  Patient ID: Mark Brady, male    DOB: 1929/12/16, 85 y.o.   MRN: [id number]  Diabetes He presents for his follow-up diabetic visit. He has type 2 diabetes mellitus. Current diabetic treatment includes diet. Home blood sugar record trend: 115- 126. Eye exam is current (beginning of the year. goes back in august).   Pt states bp has been running between 140-160's/80's  Virtual Visit via Telephone Note  I connected with [name] on 11/04/18 at  9:00 AM EDT by telephone and verified that I am speaking with the correct person using two identifiers.  Location: Patient: home Provider: office   I discussed the limitations, risks, security and privacy concerns of performing an evaluation and management service by telephone and the availability of in person appointments. I also discussed with the patient that there may be a patient responsible charge related to this service. The patient expressed understanding and agreed to proceed.   History of Present Illness:    Observations/Objective:   Assessment and Plan:   Follow Up Instructions:    I discussed the assessment and treatment plan with the patient. The patient was provided an opportunity to ask questions and all were answered. The patient agreed with the plan and demonstrated an understanding of the instructions.   The patient was advised to call back or seek an in-person evaluation if the symptoms worsen or if the condition fails to improve as anticipated.  I provided 25 minutes of non-face-to-face time during this encounter.   Patient claims compliance with diabetes medication. No obvious side effects. Reports no substantial low sugar spells. Most numbers are generally in good range when checked fasting. Generally does not miss a dose of medication. Watching diabetic diet closely  Blood pressure medicine and blood pressure levels reviewed today with patient. Compliant with blood pressure  medicine. States does not miss a dose. No obvious side effects. Blood pressure generally not good when checked elsewhere. Watching salt intake.   History of stroke no new stroke symptomatology    Review of Systems No headache, no major weight loss or weight gain, no chest pain no back pain abdominal pain no change in bowel habits complete ROS otherwise negative     Objective:   Physical Exam    Virtual    Assessment & Plan:  Impression type 2 diabetes apparent good control diet discussed compliance discussed  2.  History of stroke currently none  3.  Hyperlipidemia prior blood work reviewed we will keep out of the lab for reasons of infectiousness rationale discussed  4.  Hypertension suboptimal control discussed add Norvasc 2.5 nightly rationale discussed follow-up in 6 months diet exercise discussed

## 2018-12-17 DIAGNOSIS — H401121 Primary open-angle glaucoma, left eye, mild stage: Secondary | ICD-10-CM | POA: Diagnosis not present

## 2019-01-09 ENCOUNTER — Other Ambulatory Visit: Payer: Self-pay

## 2019-01-10 ENCOUNTER — Other Ambulatory Visit (INDEPENDENT_AMBULATORY_CARE_PROVIDER_SITE_OTHER): Payer: Medicare Other | Admitting: *Deleted

## 2019-01-10 DIAGNOSIS — Z23 Encounter for immunization: Secondary | ICD-10-CM

## 2019-01-17 ENCOUNTER — Other Ambulatory Visit: Payer: Self-pay | Admitting: Family Medicine

## 2019-01-22 ENCOUNTER — Other Ambulatory Visit: Payer: Self-pay | Admitting: Family Medicine

## 2019-04-20 ENCOUNTER — Other Ambulatory Visit: Payer: Self-pay | Admitting: Family Medicine

## 2019-04-28 ENCOUNTER — Other Ambulatory Visit: Payer: Self-pay | Admitting: Family Medicine

## 2019-04-29 NOTE — Telephone Encounter (Signed)
Please contact patient to set up appt; then may route back to nurses. Thank you  

## 2019-04-30 NOTE — Telephone Encounter (Signed)
Patient schedule phone visit for medication follow up on 2/2.

## 2019-04-30 NOTE — Telephone Encounter (Signed)
Please schedule and then route back thanks 

## 2019-04-30 NOTE — Telephone Encounter (Signed)
lvm to schedule appt.  

## 2019-04-30 NOTE — Telephone Encounter (Signed)
See tanyas note I agee. Go ahead and ref times one and sched o v virt

## 2019-05-19 ENCOUNTER — Ambulatory Visit (INDEPENDENT_AMBULATORY_CARE_PROVIDER_SITE_OTHER): Payer: Medicare Other | Admitting: Family Medicine

## 2019-05-19 ENCOUNTER — Other Ambulatory Visit: Payer: Self-pay

## 2019-05-19 DIAGNOSIS — E785 Hyperlipidemia, unspecified: Secondary | ICD-10-CM | POA: Diagnosis not present

## 2019-05-19 DIAGNOSIS — I10 Essential (primary) hypertension: Secondary | ICD-10-CM

## 2019-05-19 DIAGNOSIS — E119 Type 2 diabetes mellitus without complications: Secondary | ICD-10-CM | POA: Diagnosis not present

## 2019-05-19 MED ORDER — PANTOPRAZOLE SODIUM 40 MG PO TBEC: 40.0000 mg | DELAYED_RELEASE_TABLET | Freq: Every day | ORAL | 1 refills | Status: DC

## 2019-05-19 MED ORDER — AMLODIPINE BESYLATE 2.5 MG PO TABS: ORAL_TABLET | ORAL | 1 refills | Status: DC

## 2019-05-19 MED ORDER — LOSARTAN POTASSIUM 100 MG PO TABS: ORAL_TABLET | ORAL | 1 refills | Status: DC

## 2019-05-19 MED ORDER — PRAVASTATIN SODIUM 40 MG PO TABS: ORAL_TABLET | ORAL | 1 refills | Status: DC

## 2019-05-19 NOTE — Progress Notes (Signed)
   Subjective:  Audio only  Patient ID: Mark Brady, male    DOB: Oct 19, 1929, 86 y.o.   MRN: [id number]  Hypertension This is a chronic problem. Risk factors for coronary artery disease include male gender. Treatments tried: norvasc, cozaar. There are no compliance problems.    Problems with reflux- the omeprazole was not helping so stopped and taking otc gav[place] but would like to try something else.  Patient claims compliance with diabetes medication. No obvious side effects. Reports no substantial low sugar spells. Most numbers are generally in good range when checked fasting. Generally does not miss a dose of medication. Watching diabetic diet closely  Glu 100 to 120  Virtual Visit via Video Note  I connected with [place] on 05/19/19 at 10:00 AM EST by a video enabled telemedicine application and verified that I am speaking with the correct person using two identifiers.  Location: Patient: home Provider: office   I discussed the limitations of evaluation and management by telemedicine and the availability of in person appointments. The patient expressed understanding and agreed to proceed.  History of Present Illness:    Observations/Objective:   Assessment and Plan:   Follow Up Instructions:    I discussed the assessment and treatment plan with the patient. The patient was provided an opportunity to ask questions and all were answered. The patient agreed with the plan and demonstrated an understanding of the instructions.   The patient was advised to call back or seek an in-person evaluation if the symptoms worsen or if the condition fails to improve as anticipated.  I provided 5minutes of non-face-to-face time during this encounter.  Patient continues to take lipid medication regularly. No obvious side effects from it. Generally does not miss a dose. Prior blood work results are reviewed with patient. Patient continues to work on fat intake in  diet  Blood pressure medicine and blood pressure levels reviewed today with patient. Compliant with blood pressure medicine. States does not miss a dose. No obvious side effects. Blood pressure generally good when checked elsewhere. Watching salt intake.   Patient claims compliance with diabetes medication. No obvious side effects. Reports no substantial low sugar spells. Most numbers are generally in good range when checked fasting. Generally does not miss a dose of medication. Watching diabetic diet closely  No symptoms suggestive of TIA or stroke.  [place]  Reflux simply not well controlled currently with omeprazole.  No true dysphagia.     Review of Systems No headache no chest pain no shortness of breath    Objective:   Physical Exam  Virtual      Assessment & Plan:  Impression #1 hypertension.  Good control discussed to maintain same meds  2.  Hyperlipidemia status uncertain.  Appropriate blood work ordered compliance discussed  3.  Type 2 diabetes.  Glucose is decent when checked.  Await A1c  4.  Reflux suboptimal.  Will change to a different agent.  Rationale discussed.  5.  History of stroke clinically stable at this time  Medications refilled diet exercise discussed appropriate blood work

## 2019-05-21 ENCOUNTER — Encounter: Payer: Self-pay | Admitting: Family Medicine

## 2019-05-26 ENCOUNTER — Ambulatory Visit (INDEPENDENT_AMBULATORY_CARE_PROVIDER_SITE_OTHER): Payer: Medicare Other | Admitting: Family Medicine

## 2019-05-26 ENCOUNTER — Other Ambulatory Visit: Payer: Self-pay

## 2019-05-26 DIAGNOSIS — M109 Gout, unspecified: Secondary | ICD-10-CM | POA: Diagnosis not present

## 2019-05-26 MED ORDER — PREDNISONE 20 MG PO TABS: ORAL_TABLET | ORAL | 0 refills | Status: DC

## 2019-05-26 NOTE — Progress Notes (Signed)
   Subjective:    Patient ID: Mark Brady, male    DOB: [birth year], 86 y.o.   MRN: [id number]  HPIleft great toe pain. Pt thinks it is gout because he feels like what he had before when it was gout.  Patient relates the toe is hurting him hurts with certain movements denies any injury to it it is more in the joint it is somewhat red states it look like it did when I had gout his wife collaborates the story on the phone denies any other major setbacks denies fever chills or injury. Virtual Visit via Telephone Note  I connected with [place] on 05/26/19 at  3:30 PM EST by telephone and verified that I am speaking with the correct person using two identifiers.  Location: Patient: home Provider: office   I discussed the limitations, risks, security and privacy concerns of performing an evaluation and management service by telephone and the availability of in person appointments. I also discussed with the patient that there may be a patient responsible charge related to this service. The patient expressed understanding and agreed to proceed.   History of Present Illness:    Observations/Objective:   Assessment and Plan:   Follow Up Instructions:    I discussed the assessment and treatment plan with the patient. The patient was provided an opportunity to ask questions and all were answered. The patient agreed with the plan and demonstrated an understanding of the instructions.   The patient was advised to call back or seek an in-person evaluation if the symptoms worsen or if the condition fails to improve as anticipated.  I provided 18 minutes of non-face-to-face time during this encounter.       Review of Systems  Constitutional: Negative for activity change.  HENT: Negative for congestion and rhinorrhea.   Respiratory: Negative for cough and shortness of breath.   Cardiovascular: Negative for chest pain.  Gastrointestinal: Negative for abdominal pain, diarrhea,  nausea and vomiting.  Genitourinary: Negative for dysuria and hematuria.  Musculoskeletal:       Toe pain and discomfort denies ankle pain denies knee pain  Neurological: Negative for weakness and headaches.  Psychiatric/Behavioral: Negative for behavioral problems and confusion.       Objective:   Physical Exam  Today's visit was via telephone Physical exam was not possible for this visit       Assessment & Plan:  Probable gout It is reasonable to do prednisone taper Be careful with starches If things are not dramatically better within the next 5 to 7 days I recommend an in person visit May take Tylenol for discomfort Warning signs discussed in detail

## 2019-07-21 ENCOUNTER — Other Ambulatory Visit: Payer: Self-pay | Admitting: Family Medicine

## 2019-08-18 ENCOUNTER — Telehealth: Payer: Self-pay | Admitting: Family Medicine

## 2019-08-18 NOTE — Telephone Encounter (Signed)
Wife calling wanting to talk to the nurse about a blister on his penis.

## 2019-08-18 NOTE — Telephone Encounter (Signed)
Patient wife states patient noticed spot on the shaft of his penis this am. About 1 inch, redness but no itching or irritation. Wife decided to make an appointment with Dr. Richardson Landry so he could take a look.

## 2019-08-19 ENCOUNTER — Ambulatory Visit (INDEPENDENT_AMBULATORY_CARE_PROVIDER_SITE_OTHER): Payer: Medicare Other | Admitting: Family Medicine

## 2019-08-19 ENCOUNTER — Other Ambulatory Visit: Payer: Self-pay

## 2019-08-19 VITALS — BP 122/74 | Temp 98.2°F | Wt 166.7 lb

## 2019-08-19 DIAGNOSIS — I10 Essential (primary) hypertension: Secondary | ICD-10-CM | POA: Diagnosis not present

## 2019-08-19 DIAGNOSIS — R21 Rash and other nonspecific skin eruption: Secondary | ICD-10-CM

## 2019-08-19 DIAGNOSIS — E785 Hyperlipidemia, unspecified: Secondary | ICD-10-CM | POA: Diagnosis not present

## 2019-08-19 DIAGNOSIS — E119 Type 2 diabetes mellitus without complications: Secondary | ICD-10-CM | POA: Diagnosis not present

## 2019-08-19 MED ORDER — KETOCONAZOLE 2 % EX CREA: 1.0000 "application " | TOPICAL_CREAM | Freq: Two times a day (BID) | CUTANEOUS | 0 refills | Status: DC

## 2019-08-19 MED ORDER — PANTOPRAZOLE SODIUM 40 MG PO TBEC: 40.0000 mg | DELAYED_RELEASE_TABLET | Freq: Every day | ORAL | 1 refills | Status: DC

## 2019-08-19 MED ORDER — PRAVASTATIN SODIUM 40 MG PO TABS: ORAL_TABLET | ORAL | 1 refills | Status: DC

## 2019-08-19 MED ORDER — LOSARTAN POTASSIUM 100 MG PO TABS: ORAL_TABLET | ORAL | 1 refills | Status: DC

## 2019-08-19 MED ORDER — AMLODIPINE BESYLATE 2.5 MG PO TABS: ORAL_TABLET | ORAL | 1 refills | Status: DC

## 2019-08-19 NOTE — Progress Notes (Signed)
   Subjective:    Patient ID: Mark Brady, male    DOB: [birth year], 86 y.o.   MRN: [id number]  HPI  Patient arrives with redness in his private area.  Came up the last few days.  Somewhat itchy  At the base of his penis  No dysuria no fever no chills      Review of Systems     Objective:   Physical Exam  Alert no acute distress lungs clear.  Heart regular rate and rhythm.  Scrotum slight erythema base of penis      Assessment & Plan:  Impression nonspecific cutaneous rash will cover with ketoconazole for potential for yeast in the groin and diabetic patient.  Warning signs discussed  Patient encouraged to get his blood work  All meds refilled follow-up in 6 months for comprehensive visit

## 2019-08-20 LAB — HEPATIC FUNCTION PANEL
ALT: 11 IU/L (ref 0–44)
AST: 13 IU/L (ref 0–40)
Albumin: 4.4 g/dL (ref 3.6–4.6)
Alkaline Phosphatase: 79 IU/L (ref 39–117)
Bilirubin Total: 0.3 mg/dL (ref 0.0–1.2)
Bilirubin, Direct: 0.1 mg/dL (ref 0.00–0.40)
Total Protein: 6.6 g/dL (ref 6.0–8.5)

## 2019-08-20 LAB — BASIC METABOLIC PANEL
BUN/Creatinine Ratio: 15 (ref 10–24)
BUN: 15 mg/dL (ref 8–27)
CO2: 25 mmol/L (ref 20–29)
Calcium: 9.2 mg/dL (ref 8.6–10.2)
Chloride: 104 mmol/L (ref 96–106)
Creatinine, Ser: 0.99 mg/dL (ref 0.76–1.27)
GFR calc Af Amer: 79 mL/min/{1.73_m2} (ref >59)
GFR calc non Af Amer: 68 mL/min/{1.73_m2} (ref >59)
Glucose: 139 mg/dL — ABNORMAL HIGH (ref 65–99)
Potassium: 3.9 mmol/L (ref 3.5–5.2)
Sodium: 142 mmol/L (ref 134–144)

## 2019-08-20 LAB — MICROALBUMIN / CREATININE URINE RATIO
Creatinine, Urine: 139.8 mg/dL
Microalb/Creat Ratio: 21 mg/g creat (ref 0–29)
Microalbumin, Urine: 29 ug/mL

## 2019-08-20 LAB — LIPID PANEL
Chol/HDL Ratio: 2.8 ratio (ref 0.0–5.0)
Cholesterol, Total: 156 mg/dL (ref 100–199)
HDL: 56 mg/dL (ref >39)
LDL Chol Calc (NIH): 76 mg/dL (ref 0–99)
Triglycerides: 135 mg/dL (ref 0–149)
VLDL Cholesterol Cal: 24 mg/dL (ref 5–40)

## 2019-08-20 LAB — HEMOGLOBIN A1C
Est. average glucose Bld gHb Est-mCnc: 140 mg/dL
Hgb A1c MFr Bld: 6.5 % — ABNORMAL HIGH (ref 4.8–5.6)

## 2019-08-23 ENCOUNTER — Encounter: Payer: Self-pay | Admitting: Family Medicine

## 2019-10-21 ENCOUNTER — Telehealth: Payer: Self-pay | Admitting: Family Medicine

## 2019-10-21 NOTE — Telephone Encounter (Signed)
Pt schedule [facility] Monday August 9th does he need blood work?

## 2019-10-21 NOTE — Telephone Encounter (Signed)
Last labs 08/19/19: Lipid, liver, Met 7, HgbA1c and Micro albumin urine

## 2019-10-26 NOTE — Telephone Encounter (Signed)
Does he need this physicial for work?  Why he coming in august?  He could push it back to November and labs would be due then.  If he needs to come in August for physical it's too early for labs since just had them in 5/21.    Thx.   Dr. Lovena Le

## 2019-10-26 NOTE — Telephone Encounter (Signed)
Keep physical as scheduled- notified blood work not due till November.

## 2019-10-26 NOTE — Telephone Encounter (Signed)
Left message to return call 

## 2019-11-06 ENCOUNTER — Ambulatory Visit
Admission: EM | Admit: 2019-11-06 | Discharge: 2019-11-06 | Disposition: A | Discharge status: Home / Self Care | Payer: Medicare Other | Attending: Emergency Medicine | Admitting: Emergency Medicine

## 2019-11-06 ENCOUNTER — Encounter: Payer: Self-pay | Admitting: Emergency Medicine

## 2019-11-06 DIAGNOSIS — M7022 Olecranon bursitis, left elbow: Secondary | ICD-10-CM

## 2019-11-06 MED ORDER — PREDNISONE 20 MG PO TABS: 20.0000 mg | ORAL_TABLET | Freq: Every day | ORAL | 0 refills | Status: AC

## 2019-11-06 NOTE — Discharge Instructions (Signed)
Continue conservative management of rest, ice, and elevation Prednisone prescribed.  Take as directed and to completion Follow up with PCP if symptoms persist Return or go to the ER if you have any new or worsening symptoms (fever, chills, chest pain, swelling, deformity, bruising, worsening symptoms despite treatment, etc...)

## 2019-11-06 NOTE — ED Triage Notes (Signed)
Swollen area to LT elbow x 1 week that has continued to get worse. Denies any pain or injury

## 2019-11-06 NOTE — ED Provider Notes (Signed)
[place]   [id number] 11/06/19 Arrival Time: 1632  CC:LT elbow swelling  SUBJECTIVE: History from: patient. Mark Brady is a 86 y.o. male complains of LT elbow swelling x few days.  Denies a precipitating event or specific injury.  Denies pain.  Denies alleviating or aggravating factors.  Denies similar symptoms in the past.  Denies fever, chills, erythema, ecchymosis, weakness.    ROS: As per HPI.  All other pertinent ROS negative.     Past Medical History:  Diagnosis Date  . Arthritis   . Diabetes mellitus without complication (HCC)    diet controlled  . History of elevated PSA   . Hyperlipidemia   . Hypertension   . Stroke [facility])    Past Surgical History:  Procedure Laterality Date  . CATARACT EXTRACTION W/PHACO Right 01/26/2014   Procedure: CATARACT EXTRACTION PHACO AND INTRAOCULAR LENS PLACEMENT (IOC);  Surgeon: Elta Guadeloupe T. Gershon Crane, MD;  Location: [facility];  Service: Ophthalmology;  Laterality: Right;  CDE 6.53  . CATARACT EXTRACTION W/PHACO Left 02/09/2014   Procedure: CATARACT EXTRACTION PHACO AND INTRAOCULAR LENS PLACEMENT (IOC);  Surgeon: Elta Guadeloupe T. Gershon Crane, MD;  Location: [facility];  Service: Ophthalmology;  Laterality: Left;  CDE:5.58  . COLONOSCOPY    . YAG LASER APPLICATION Right 10/14/1599   Procedure: YAG LASER APPLICATION;  Surgeon: Rutherford Guys, MD;  Location: [facility];  Service: Ophthalmology;  Laterality: Right;   Allergies  Allergen Reactions  . Aspirin Rash   No current facility-administered medications on file prior to encounter.   Current Outpatient Medications on File Prior to Encounter  Medication Sig Dispense Refill  . ACCU-CHEK AVIVA PLUS test strip USE 1 STRIP TO CHECK GLUCOSE ONCE DAILY 100 strip 0  . ACCU-CHEK SOFTCLIX LANCETS lancets USE AS DIRECTED ONCE DAILY 100 each 5  . Accu-Chek Softclix Lancets lancets USE ONCE DAILY AS DIRECTED 100 each 5  . amLODipine (NORVASC) 2.5 MG tablet TAKE 1 TABLET(2.5 MG) BY MOUTH AT BEDTIME 90 tablet 1  .  dorzolamide-timolol (COSOPT) 22.3-6.8 MG/ML ophthalmic solution Place 1 drop into both eyes daily.    Marland Kitchen ketoconazole (NIZORAL) 2 % cream Apply 1 application topically 2 (two) times daily. Affected area 15 g 0  . latanoprost (XALATAN) 0.005 % ophthalmic solution INT 1 GTT IN OU QD IN THE EVE    . losartan (COZAAR) 100 MG tablet TAKE 1 TABLET(100 MG) BY MOUTH DAILY 90 tablet 1  . pantoprazole (PROTONIX) 40 MG tablet Take 1 tablet (40 mg total) by mouth daily. 90 tablet 1  . pravastatin (PRAVACHOL) 40 MG tablet TAKE 1 TABLET(40 MG) BY MOUTH DAILY 90 tablet 1   Social History   Socioeconomic History  . Marital status: Married    Spouse name: Not on file  . Number of children: Not on file  . Years of education: Not on file  . Highest education level: Not on file  Occupational History  . Not on file  Tobacco Use  . Smoking status: Never Smoker  . Smokeless tobacco: Never Used  Substance and Sexual Activity  . Alcohol use: No  . Drug use: No  . Sexual activity: Yes    Birth control/protection: None  Other Topics Concern  . Not on file  Social History Narrative  . Not on file   Social Determinants of Health   Financial Resource Strain:   . Difficulty of Paying Living Expenses:   Food Insecurity:   . Worried About Charity fundraiser in the Last Year:   .  Ran Out of Food in the Last Year:   Transportation Needs:   . Film/video editor (Medical):   Marland Kitchen Lack of Transportation (Non-Medical):   Physical Activity:   . Days of Exercise per Week:   . Minutes of Exercise per Session:   Stress:   . Feeling of Stress :   Social Connections:   . Frequency of Communication with Friends and Family:   . Frequency of Social Gatherings with Friends and Family:   . Attends Religious Services:   . Active Member of Clubs or Organizations:   . Attends Archivist Meetings:   Marland Kitchen Marital Status:   Intimate Partner Violence:   . Fear of Current or Ex-Partner:   . Emotionally Abused:     Marland Kitchen Physically Abused:   . Sexually Abused:    Family History  Problem Relation Age of Onset  . Arthritis Other     OBJECTIVE:  Vitals:   11/06/19 1643 11/06/19 1645  BP:  (!) 162/89  Pulse:  75  Resp:  18  Temp:  97.9 F (36.6 C)  TempSrc:  Oral  SpO2:  95%  Weight: 175 lb (79.4 kg)   Height: 5\' 7"  (1.702 m)     General appearance: ALERT; in no acute distress.  Head: NCAT Lungs: Normal respiratory effort CV: Radial pulse 2+ Musculoskeletal: LT elbow Inspection: Swelling over olecranon process Palpation: Nontender to palpation ROM: FROM active and passive Skin: warm and dry Neurologic: Ambulates without difficulty; Sensation intact about the upper extremities Psychological: alert and cooperative; normal mood and affect  ASSESSMENT & PLAN:  1. Olecranon bursitis of left elbow    Meds ordered this encounter  Medications  . predniSONE (DELTASONE) 20 MG tablet    Sig: Take 1 tablet (20 mg total) by mouth daily with breakfast for 5 days.    Dispense:  5 tablet    Refill:  0    Order Specific Question:   Supervising Provider    Answer:   Raylene Everts [[id number]]   Continue conservative management of rest, ice, and elevation Prednisone prescribed.  Take as directed and to completion Follow up with PCP if symptoms persist Return or go to the ER if you have any new or worsening symptoms (fever, chills, chest pain, swelling, deformity, bruising, worsening symptoms despite treatment, etc...)   Reviewed expectations re: course of current medical issues. Questions answered. Outlined signs and symptoms indicating need for more acute intervention. Patient verbalized understanding. After Visit Summary given.    Lestine Box, PA-C 11/06/19 1707

## 2019-11-16 ENCOUNTER — Other Ambulatory Visit: Payer: Self-pay

## 2019-11-23 ENCOUNTER — Other Ambulatory Visit: Payer: Self-pay

## 2019-11-23 ENCOUNTER — Ambulatory Visit (INDEPENDENT_AMBULATORY_CARE_PROVIDER_SITE_OTHER): Payer: Medicare Other | Admitting: Family Medicine

## 2019-11-23 ENCOUNTER — Encounter: Payer: Self-pay | Admitting: Family Medicine

## 2019-11-23 VITALS — BP 154/92 | HR 77 | Temp 97.2°F | Ht 65.0 in | Wt 166.8 lb

## 2019-11-23 DIAGNOSIS — Z0000 Encounter for general adult medical examination without abnormal findings: Secondary | ICD-10-CM

## 2019-11-23 DIAGNOSIS — E785 Hyperlipidemia, unspecified: Secondary | ICD-10-CM

## 2019-11-23 DIAGNOSIS — E119 Type 2 diabetes mellitus without complications: Secondary | ICD-10-CM

## 2019-11-23 DIAGNOSIS — I10 Essential (primary) hypertension: Secondary | ICD-10-CM

## 2019-11-23 DIAGNOSIS — H409 Unspecified glaucoma: Secondary | ICD-10-CM | POA: Diagnosis not present

## 2019-11-23 NOTE — Progress Notes (Signed)
Patient ID: Mark Brady, male    DOB: [birth year], 86 y.o.   MRN: [id number]   Chief Complaint  Patient presents with  . Annual Exam   Subjective:    HPI AWV- Annual Wellness Visit  The patient was seen for their annual wellness visit. The patient's past medical history, surgical history, and family history were reviewed. Pertinent vaccines were reviewed ( tetanus, pneumonia, shingles, flu) The patient's medication list was reviewed and updated.  The height and weight were entered.  BMI recorded in electronic record elsewhere  Cognitive screening was completed. Outcome of Mini - Cog: fail   Falls /depression screening electronically recorded within record elsewhere  Current tobacco usage: none (All patients who use tobacco were given written and verbal information on quitting)  Recent listing of emergency department/hospitalizations over the past year were reviewed.  current specialist the patient sees on a regular basis: [facility]    Medicare annual wellness visit patient questionnaire was reviewed.  A written screening schedule for the patient for the next 5-10 years was given. Appropriate discussion of followup regarding next visit was discussed.  HTN- taking one in am and one in pm.   Pt stating took medications today. Slightly elevated bp today.  Medical History [place] has a past medical history of Arthritis, Diabetes mellitus without complication ([place]), History of elevated PSA, Hyperlipidemia, Hypertension, and Stroke ([place]).   Outpatient Encounter Medications as of 11/23/2019  Medication Sig  . Cholecalciferol (VITAMIN D-3) 125 MCG (5000 UT) TABS Take by mouth.  Marland Kitchen ACCU-CHEK AVIVA PLUS test strip USE 1 STRIP TO CHECK GLUCOSE ONCE DAILY  . ACCU-CHEK SOFTCLIX LANCETS lancets USE AS DIRECTED ONCE DAILY  . Accu-Chek Softclix Lancets lancets USE ONCE DAILY AS DIRECTED  . amLODipine (NORVASC) 2.5 MG tablet TAKE 1 TABLET(2.5 MG) BY MOUTH AT BEDTIME  .  dorzolamide-timolol (COSOPT) 22.3-6.8 MG/ML ophthalmic solution Place 1 drop into both eyes daily.  Marland Kitchen ketoconazole (NIZORAL) 2 % cream Apply 1 application topically 2 (two) times daily. Affected area  . latanoprost (XALATAN) 0.005 % ophthalmic solution INT 1 GTT IN OU QD IN THE EVE  . losartan (COZAAR) 100 MG tablet TAKE 1 TABLET(100 MG) BY MOUTH DAILY  . pantoprazole (PROTONIX) 40 MG tablet Take 1 tablet (40 mg total) by mouth daily.  . pravastatin (PRAVACHOL) 40 MG tablet TAKE 1 TABLET(40 MG) BY MOUTH DAILY   No facility-administered encounter medications on file as of 11/23/2019.     Review of Systems  Constitutional: Negative for chills and fever.  HENT: Negative for congestion, rhinorrhea and sore throat.   Respiratory: Negative for cough, shortness of breath and wheezing.   Cardiovascular: Negative for chest pain and leg swelling.  Gastrointestinal: Negative for abdominal pain, diarrhea, nausea and vomiting.  Genitourinary: Negative for dysuria and frequency.  Musculoskeletal: Positive for joint swelling.       +left elbow swelling.   Skin: Negative for rash.  Neurological: Negative for dizziness, weakness and headaches.     Vitals BP (!) 154/92   Pulse 77   Temp (!) 97.2 F (36.2 C)   Ht 5\' 5"  (1.651 m)   Wt 166 lb 12.8 oz (75.7 kg)   SpO2 97%   BMI 27.76 kg/m   Objective:   Physical Exam Vitals and nursing note reviewed.  Constitutional:      General: He is not in acute distress.    Appearance: Normal appearance. He is not ill-appearing.  HENT:     Head: Normocephalic.  Nose: Nose normal. No congestion.     Mouth/Throat:     Mouth: Mucous membranes are moist.     Pharynx: No oropharyngeal exudate.  Eyes:     Extraocular Movements: Extraocular movements intact.     Conjunctiva/sclera: Conjunctivae normal.     Pupils: Pupils are equal, round, and reactive to light.  Cardiovascular:     Rate and Rhythm: Normal rate and regular rhythm.     Pulses: Normal  pulses.     Heart sounds: Normal heart sounds. No murmur heard.   Pulmonary:     Effort: Pulmonary effort is normal.     Breath sounds: Normal breath sounds. No wheezing, rhonchi or rales.  Musculoskeletal:        General: Swelling (mild swelling on left elbow. ) present. No tenderness. Normal range of motion.     Right lower leg: No edema.     Left lower leg: No edema.     Comments: No erythema or warmth. Normal rom left elbow.  Skin:    General: Skin is warm and dry.     Findings: No rash.  Neurological:     General: No focal deficit present.     Mental Status: He is alert and oriented to person, place, and time.     Cranial Nerves: No cranial nerve deficit.  Psychiatric:        Mood and Affect: Mood normal.        Behavior: Behavior normal.        Thought Content: Thought content normal.        Judgment: Judgment normal.      Assessment and Plan   1. Medicare annual wellness visit, subsequent  2. Glaucoma of both eyes, unspecified glaucoma type  3. Essential hypertension, benign  4. Hyperlipidemia LDL goal <100  5. Type 2 diabetes mellitus without complication, without long-term current use of insulin (HCC)   htn- suboptimal.  recheck bp. Seeing 140-150s at home. Bottom usually in 80s. Call if seeing numbers over 482L systolic. Wife and pt in agreement.  Glaucoma- seeing optho.  elbow bursitis, still swollen.- no pain at this time. Rest, ice, tylenol prn.  HLD- cont pravastatin.  DM2- not on meds. Diet controlled.  a1c at 6.5 stable.  F/u 53mo prn.

## 2020-03-15 ENCOUNTER — Other Ambulatory Visit: Payer: Self-pay

## 2020-03-15 MED ORDER — ACCU-CHEK AVIVA PLUS VI STRP: ORAL_STRIP | 0 refills | Status: DC

## 2020-03-31 DIAGNOSIS — H401121 Primary open-angle glaucoma, left eye, mild stage: Secondary | ICD-10-CM | POA: Diagnosis not present

## 2020-05-10 ENCOUNTER — Other Ambulatory Visit: Payer: Self-pay | Admitting: *Deleted

## 2020-05-10 MED ORDER — PRAVASTATIN SODIUM 40 MG PO TABS: ORAL_TABLET | ORAL | 0 refills | Status: DC

## 2020-05-13 ENCOUNTER — Encounter: Payer: Self-pay | Admitting: Nurse Practitioner

## 2020-05-13 ENCOUNTER — Other Ambulatory Visit: Payer: Self-pay

## 2020-05-13 ENCOUNTER — Ambulatory Visit (INDEPENDENT_AMBULATORY_CARE_PROVIDER_SITE_OTHER): Payer: Medicare Other | Admitting: Nurse Practitioner

## 2020-05-13 VITALS — BP 168/83 | HR 81 | Temp 97.7°F

## 2020-05-13 DIAGNOSIS — K219 Gastro-esophageal reflux disease without esophagitis: Secondary | ICD-10-CM | POA: Diagnosis not present

## 2020-05-13 DIAGNOSIS — E785 Hyperlipidemia, unspecified: Secondary | ICD-10-CM

## 2020-05-13 DIAGNOSIS — E119 Type 2 diabetes mellitus without complications: Secondary | ICD-10-CM | POA: Diagnosis not present

## 2020-05-13 DIAGNOSIS — I10 Essential (primary) hypertension: Secondary | ICD-10-CM | POA: Diagnosis not present

## 2020-05-13 DIAGNOSIS — K117 Disturbances of salivary secretion: Secondary | ICD-10-CM | POA: Diagnosis not present

## 2020-05-13 NOTE — Progress Notes (Signed)
   Subjective:    Patient ID: Mark Brady, male    DOB: [birth year], 87 y.o.   MRN: [id number]  HPI Patient comes in to follow up on hypertension and diabetes.  Wife reports patient has been constantly drooling.    Review of Systems     Objective:   Physical Exam        Assessment & Plan:

## 2020-05-13 NOTE — Progress Notes (Addendum)
Subjective:    Patient ID: Mark Brady, male    DOB: [birth year], 87 y.o.   MRN: [id number]  HPI  Patient presents with his wife for 6 month follow-up of his physical.  Wife is the main historian for visit. Reports being in good health.  Walks approximately 30 minutes a day [place] allows [place] using [place] walking [place].  No recent falls. Has lost a little weight.  Wife reports eating salmon, vegetables, fruit and mostly other home cooked foods.  He has had a cough since August.  Also states he drools excessively. BP is slightly elevated but consistent with BP over the past few visits.   Patient c/o nonproductive cough.  Pt currently taking pantoprazole and gaviscon for heartburn as needed. Pt has slept sitting up in a recliner for about a year.  Nothing seems to make the cough better, but it seems to be worst after breakfast which can include coffee and an orange.  No hx of recent infection. No fevers or chills.    Reports drooling excessively.  This has been occurring for a few months.  Has not attempted to treat it with anything.  Requesting a medication to assist with this. His wife states that he also leaves his mouth open which causes more drooling. No obvious difficulty swallowing.       Review of Systems  Constitutional:       Denies change of appetite, weight loss, fever, fatigue.  HENT:       Positive for drooling.  Denies rhinorrhea, congestion, sneezing, sore throat.  Eyes:       Reports almost complete loss of vision in right eye.  Following with ophthalmologist for glaucoma, saw in Dec and will see again in March.  Respiratory:       Endorses cough. Nonproductive. Denies SOB, dyspnea.  Cardiovascular:       Denies chest pain or leg swelling.  Gastrointestinal:       Reports slight constipation, denies diarrhea.  No blood in stool, nausea or vomiting, abdominal pain.  Endocrine:       Denies polydipsia, polyuria, polyphagia.  Genitourinary:       Denies  hematuria, dysuria.   Musculoskeletal:       Denies arthralgias, myalgias.  Skin:       Denies presence of rash or wounds.   Neurological:       Denies numbness, tingling, dizziness, lightheadedness, weakness, headaches.  Psychiatric/Behavioral:       Denies depression or anxiety, sleep disturbance.        Objective:   Physical Exam Constitutional:      General: He is not in acute distress. HENT:     Mouth/Throat:     Comments: Excessive saliva present under face mask. Cardiovascular:     Comments: Regular rate and rhythm.  Pulmonary:     Effort: Pulmonary effort is normal.     Breath sounds: Normal breath sounds.     Comments: Normal respiratory effort.  No rales, wheezes. Abdominal:     General: There is no distension.     Palpations: Abdomen is soft. There is no mass.     Tenderness: There is abdominal tenderness. There is no guarding.     Comments: Mild tenderness upper L quadrant of abdomen near epigastric area with light palpation. Abdomen soft, no obvious masses or organomegaly.  Skin:    Comments: Skin warm and dry. Slight scaly, red patches noted in multiple areas of feet.  Neurological:     Mental Status: He is alert and oriented to person, place, and time.  Psychiatric:        Thought Content: Thought content normal.   Exam performed in the chair due to difficulty getting on exam table.  Vitals:   05/13/20 1025  BP: (!) 170/98  Pulse: (!) 49  Temp: 97.7 F (36.5 C)  SpO2: 100%    Diabetic Foot Exam - Simple   Simple Foot Form Visual Inspection See comments: Yes Sensation Testing Intact to touch and monofilament testing bilaterally: Yes Pulse Check See comments: Yes Comments Posterior Tibialis and Dorsalis pulse present BIL. Thick, yellow toenails present. Slight red scaly patches present on patient's feet in multiple areas; no complaints. Skin warm and cap refill less than 3 seconds.    No A1C in the office today due to machine malfunction.      Assessment & Plan:   Problem List Items Addressed This Visit      Cardiovascular and Mediastinum   Essential hypertension, benign - Primary     Digestive   Gastroesophageal reflux disease without esophagitis   Sialorrhea     Endocrine   Diabetes ([place])     Other   Hyperlipidemia LDL goal <100     Continue to monitor BP and pulse.  Recommend SP referral for drooling and cough. Hold on anticholinergics for excess salivation due to concerns about potential side effects.  Activity as tolerated.  Reduce caffeine and citrus in diet. Continue Protonix as directed. Call back in 2-3 weeks if no improvement in reflux.  Return in about 6 months (around 11/10/2020) for physical. and labs.

## 2020-05-14 ENCOUNTER — Encounter: Payer: Self-pay | Admitting: Nurse Practitioner

## 2020-05-14 DIAGNOSIS — K117 Disturbances of salivary secretion: Secondary | ICD-10-CM | POA: Insufficient documentation

## 2020-05-16 NOTE — Progress Notes (Signed)
05/16/20- spoke with Vaughan Basta. Vaughan Basta will talk it over with patient and then will give [place] a call back

## 2020-05-17 ENCOUNTER — Telehealth: Payer: Self-pay

## 2020-05-17 NOTE — Telephone Encounter (Signed)
Noted  

## 2020-05-17 NOTE — Telephone Encounter (Signed)
Mark Brady called and wants to let [name] know that they do not want Speech Therapy at this time

## 2020-05-18 ENCOUNTER — Other Ambulatory Visit: Payer: Self-pay

## 2020-05-18 MED ORDER — PANTOPRAZOLE SODIUM 40 MG PO TBEC: 40.0000 mg | DELAYED_RELEASE_TABLET | Freq: Every day | ORAL | 1 refills | Status: DC

## 2020-05-18 NOTE — Progress Notes (Signed)
Wife states pt does not want therapy at this time

## 2020-06-13 ENCOUNTER — Other Ambulatory Visit: Payer: Self-pay | Admitting: Family Medicine

## 2020-06-30 DIAGNOSIS — H401121 Primary open-angle glaucoma, left eye, mild stage: Secondary | ICD-10-CM | POA: Diagnosis not present

## 2020-08-10 ENCOUNTER — Ambulatory Visit
Admission: EM | Admit: 2020-08-10 | Discharge: 2020-08-10 | Disposition: A | Discharge status: Home / Self Care | Payer: Medicare Other | Attending: Family Medicine | Admitting: Family Medicine

## 2020-08-10 ENCOUNTER — Ambulatory Visit (INDEPENDENT_AMBULATORY_CARE_PROVIDER_SITE_OTHER): Payer: Medicare Other

## 2020-08-10 ENCOUNTER — Other Ambulatory Visit: Payer: Self-pay

## 2020-08-10 ENCOUNTER — Encounter: Payer: Self-pay | Admitting: Emergency Medicine

## 2020-08-10 DIAGNOSIS — R053 Chronic cough: Secondary | ICD-10-CM | POA: Diagnosis not present

## 2020-08-10 DIAGNOSIS — R079 Chest pain, unspecified: Secondary | ICD-10-CM

## 2020-08-10 DIAGNOSIS — J209 Acute bronchitis, unspecified: Secondary | ICD-10-CM

## 2020-08-10 DIAGNOSIS — R058 Other specified cough: Secondary | ICD-10-CM | POA: Diagnosis not present

## 2020-08-10 DIAGNOSIS — R059 Cough, unspecified: Secondary | ICD-10-CM | POA: Diagnosis not present

## 2020-08-10 MED ORDER — PROMETHAZINE-DM 6.25-15 MG/5ML PO SYRP: 5.0000 mL | ORAL_SOLUTION | Freq: Every evening | ORAL | 0 refills | Status: DC | PRN

## 2020-08-10 MED ORDER — BENZONATATE 100 MG PO CAPS: 200.0000 mg | ORAL_CAPSULE | Freq: Three times a day (TID) | ORAL | 0 refills | Status: DC | PRN

## 2020-08-10 MED ORDER — DOXYCYCLINE HYCLATE 100 MG PO CAPS
100.0000 mg | ORAL_CAPSULE | Freq: Two times a day (BID) | ORAL | 0 refills | Status: DC
Start: 2020-08-10 — End: 2020-08-24

## 2020-08-10 NOTE — ED Provider Notes (Signed)
[facility]    CSN: [id number] Arrival date & time: 08/10/20  1002      History   Chief Complaint No chief complaint on file.   HPI Mark Brady is a 87 y.o. male.   HPI Patient with a medical history consisting of diabetes, hypertension, hyperlipidemia, Presents with cough, productive, and worsening over the last 3 days. Denies any SOB, endorse chest pain intermittently although pain isn't severe. Endorses generalized HA and poor sleep quality related to persistency of cough.  Past Medical History:  Diagnosis Date  . Arthritis   . Diabetes mellitus without complication (HCC)    diet controlled  . History of elevated PSA   . Hyperlipidemia   . Hypertension   . Stroke [facility])     Patient Active Problem List   Diagnosis Date Noted  . Sialorrhea 05/14/2020  . Gastroesophageal reflux disease without esophagitis 05/13/2020  . Glaucoma 11/23/2019  . Chest pain 09/05/2014  . Diabetes ([place]) 10/19/2012  . Cerebrovascular accident (stroke) ([place]) 09/28/2012  . Essential hypertension, benign 09/28/2012  . Hyperlipidemia LDL goal <100 09/28/2012  . Leg pain 03/25/2012  . CHEST PAIN 01/05/2009    Past Surgical History:  Procedure Laterality Date  . CATARACT EXTRACTION W/PHACO Right 01/26/2014   Procedure: CATARACT EXTRACTION PHACO AND INTRAOCULAR LENS PLACEMENT (IOC);  Surgeon: Elta Guadeloupe T. Gershon Crane, MD;  Location: [facility];  Service: Ophthalmology;  Laterality: Right;  CDE 6.53  . CATARACT EXTRACTION W/PHACO Left 02/09/2014   Procedure: CATARACT EXTRACTION PHACO AND INTRAOCULAR LENS PLACEMENT (IOC);  Surgeon: Elta Guadeloupe T. Gershon Crane, MD;  Location: [facility];  Service: Ophthalmology;  Laterality: Left;  CDE:5.58  . COLONOSCOPY    . YAG LASER APPLICATION Right 12/31/9148   Procedure: YAG LASER APPLICATION;  Surgeon: Rutherford Guys, MD;  Location: [facility];  Service: Ophthalmology;  Laterality: Right;       Home Medications    Prior to Admission medications   Medication Sig  Start Date End Date Taking? Authorizing Provider  ACCU-CHEK SOFTCLIX LANCETS lancets USE AS DIRECTED ONCE DAILY 01/06/18   Mikey Kirschner, MD  Accu-Chek Softclix Lancets lancets USE ONCE DAILY AS DIRECTED 01/22/19   Mikey Kirschner, MD  amLODipine (NORVASC) 2.5 MG tablet TAKE 1 TABLET(2.5 MG) BY MOUTH AT BEDTIME 08/19/19   Mikey Kirschner, MD  Cholecalciferol (VITAMIN D-3) 125 MCG (5000 UT) TABS Take by mouth.    [provider]  dorzolamide-timolol (COSOPT) 22.3-6.8 MG/ML ophthalmic solution Place 1 drop into both eyes daily.    [provider]  glucose blood (ACCU-CHEK AVIVA PLUS) test strip USE TO CHECK BLOOD SUGAR EVERY DAY 06/14/20   Nilda Simmer, NP  ketoconazole (NIZORAL) 2 % cream Apply 1 application topically 2 (two) times daily. Affected area 08/19/19   Mikey Kirschner, MD  latanoprost (XALATAN) 0.005 % ophthalmic solution INT 1 GTT IN OU QD IN THE EVE 09/14/18   [provider]  losartan (COZAAR) 100 MG tablet TAKE 1 TABLET(100 MG) BY MOUTH DAILY 08/19/19   Mikey Kirschner, MD  OVER THE COUNTER MEDICATION Zinc  Vit b12 Multivitamin    [provider]  pantoprazole (PROTONIX) 40 MG tablet Take 1 tablet (40 mg total) by mouth daily. 05/18/20   Elvia Collum M, DO  pravastatin (PRAVACHOL) 40 MG tablet TAKE 1 TABLET(40 MG) BY MOUTH DAILY 05/10/20   Erven Colla, DO    Family History Family History  Problem Relation Age of Onset  . Arthritis Other  Social History Social History   Tobacco Use  . Smoking status: Never Smoker  . Smokeless tobacco: Never Used  Substance Use Topics  . Alcohol use: No  . Drug use: No     Allergies   Penicillins and Aspirin   Review of Systems Review of Systems Pertinent negatives listed in HPI   Physical Exam Triage Vital Signs ED Triage Vitals  Enc Vitals Group     BP 08/10/20 1009 (!) 165/76     Pulse Rate 08/10/20 1009 67     Resp 08/10/20 1009 16     Temp 08/10/20 1009 97.7 F (36.5  C)     Temp Source 08/10/20 1009 Oral     SpO2 08/10/20 1009 97 %     Weight --      Height --      Head Circumference --      Peak Flow --      Pain Score 08/10/20  0     Pain Loc --      Pain Edu? --      Excl. in [place]? --    No data found.  Updated Vital Signs BP (!) 165/76 (BP Location: Right Arm)   Pulse 67   Temp 97.7 F (36.5 C) (Oral)   Resp 16   SpO2 97%   Visual Acuity Right Eye Distance:   Left Eye Distance:   Bilateral Distance:    Right Eye Near:   Left Eye Near:    Bilateral Near:     Physical Exam General appearance: alert, Ill-appearing, no distress Head: Normocephalic, without obvious abnormality, atraumatic ENT: Ears normal, nares patent without congestion, erythematous oropharynx w/o exudate Respiratory: Respirations even , unlabored, coarse lung sound, rhonchi present  Heart: rate and rhythm normal. No gallop or murmurs noted on exam  Abdomen: BS +, no distention, no rebound tenderness, or no mass Extremities: No gross deformities Skin: Skin color, texture, turgor normal. No rashes seen  Psych: Appropriate mood and affect. Neurologic: Baseline, alert, responds to questions appropriate, antalgic gait  UC Treatments / Results  Labs (all labs ordered are listed, but only abnormal results are displayed) Labs Reviewed - No data to display  EKG NSR 65 with 1st degree block with PVC  Radiology No results found.  Procedures Procedures (including critical care time)  Medications Ordered in UC Medications - No data to display  Initial Impression / Assessment and Plan / UC Course  I have reviewed the triage vital signs and the nursing notes.  Pertinent labs & imaging results that were available during my care of the patient were reviewed by me and considered in my medical decision making (see chart for details).    Chest x-ray negative for any acute opacities which are concerning for pneumonia.  Treating for acute bronchitis.  EKG negative for  any acute ischemic changes. Treatment per discharge instructions.  Patient's vital signs stable with oxygen level at 97%.  Patient not having any acute shortness of breath.  Advised caregiver if any of his symptoms worsen follow-up immediately in the ER for further evaluation. Final Clinical Impressions(s) / UC Diagnoses   Final diagnoses:  Persistent cough  Acute bronchitis, unspecified organism  Nonspecific chest pain     Discharge Instructions     I am treating you today for bronchitis. Start Doxycycline 100 mg twice daily for 10 day Promethazine DM for cough prior bedtime Benzonatate pearls every 8 hours for cough     ED Prescriptions  Medication Sig Dispense Auth. Provider   doxycycline (VIBRAMYCIN) 100 MG capsule Take 1 capsule (100 mg total) by mouth 2 (two) times daily. 20 capsule Scot Jun, FNP   promethazine-dextromethorphan (PROMETHAZINE-DM) 6.25-15 MG/5ML syrup Take 5 mLs by mouth at bedtime and may repeat dose one time if needed. 120 mL Scot Jun, FNP   benzonatate (TESSALON) 100 MG capsule Take 2 capsules (200 mg total) by mouth 3 (three) times daily as needed for cough. 40 capsule Scot Jun, FNP     PDMP not reviewed this encounter.   Scot Jun, FNP 08/10/20 1253

## 2020-08-10 NOTE — ED Triage Notes (Signed)
Productive Cough x 3 days- yellow sputum.

## 2020-08-10 NOTE — Discharge Instructions (Addendum)
I am treating you today for bronchitis. Start Doxycycline 100 mg twice daily for 10 day Promethazine DM for cough prior bedtime Benzonatate pearls every 8 hours for cough

## 2020-08-12 ENCOUNTER — Other Ambulatory Visit: Payer: Self-pay | Admitting: Family Medicine

## 2020-08-15 ENCOUNTER — Telehealth: Payer: Self-pay | Admitting: Family Medicine

## 2020-08-15 MED ORDER — LOSARTAN POTASSIUM 100 MG PO TABS: ORAL_TABLET | ORAL | 0 refills | Status: DC

## 2020-08-15 MED ORDER — AMLODIPINE BESYLATE 2.5 MG PO TABS: ORAL_TABLET | ORAL | 0 refills | Status: DC

## 2020-08-15 NOTE — Telephone Encounter (Signed)
[facility] [address] requesting refill on Amlodipine 2.5 mg tablets. Take one tablet po at bedtime. Pt last seen 05/13/20 for HTN. Please advise. Thank you

## 2020-08-15 NOTE — Telephone Encounter (Signed)
Would recommend pt follow up in the next month to review blood pressure, has been elevated in 160-170s the last 2 visits.  May need a medication adjustment.   Will give 30 day supply.  Thx. Dr. Lovena Le

## 2020-08-16 ENCOUNTER — Other Ambulatory Visit: Payer: Self-pay | Admitting: Family Medicine

## 2020-08-16 NOTE — Telephone Encounter (Signed)
Please contact patient to have him schedule medication follow up. Thank you!

## 2020-08-16 NOTE — Telephone Encounter (Signed)
Left message to return call. Note has been sent to front to schedule appt

## 2020-08-16 NOTE — Telephone Encounter (Signed)
Pt has schedule appt for 08/24/20

## 2020-08-16 NOTE — Telephone Encounter (Signed)
Appointment schedule for 5/11

## 2020-08-18 ENCOUNTER — Other Ambulatory Visit: Payer: Self-pay | Admitting: Family Medicine

## 2020-08-24 ENCOUNTER — Ambulatory Visit (INDEPENDENT_AMBULATORY_CARE_PROVIDER_SITE_OTHER): Payer: Medicare Other | Admitting: Family Medicine

## 2020-08-24 ENCOUNTER — Other Ambulatory Visit: Payer: Self-pay

## 2020-08-24 VITALS — BP 159/80 | HR 71 | Temp 97.5°F | Ht 65.0 in | Wt 160.0 lb

## 2020-08-24 DIAGNOSIS — Z7189 Other specified counseling: Secondary | ICD-10-CM

## 2020-08-24 DIAGNOSIS — E119 Type 2 diabetes mellitus without complications: Secondary | ICD-10-CM | POA: Diagnosis not present

## 2020-08-24 DIAGNOSIS — I10 Essential (primary) hypertension: Secondary | ICD-10-CM | POA: Diagnosis not present

## 2020-08-24 MED ORDER — FLUTICASONE PROPIONATE 50 MCG/ACT NA SUSP: 2.0000 | Freq: Every day | NASAL | 3 refills | Status: DC

## 2020-08-24 MED ORDER — AMLODIPINE BESYLATE 2.5 MG PO TABS: 2.5000 mg | ORAL_TABLET | Freq: Two times a day (BID) | ORAL | 1 refills | Status: DC

## 2020-08-24 MED ORDER — LOSARTAN POTASSIUM 100 MG PO TABS: ORAL_TABLET | ORAL | 1 refills | Status: DC

## 2020-08-24 NOTE — Patient Instructions (Signed)
Claritin, zytrec, or allegra- over the counter  Take flonase daily.

## 2020-08-24 NOTE — Progress Notes (Signed)
Patient ID: Mark Brady, male    DOB: 01-18-1930, 87 y.o.   MRN: [id number]   Chief Complaint  Patient presents with  . Hypertension    Follow up    Subjective:    HPI F/u htn- 150-174/70-90. At home.  HTN Pt compliant with BP meds.  No SEs Denies chest pain, sob, LE swelling, or blurry vision.  Taking amlodipine at night 2.74m  Had losartan 1065m Had already ealry this am.  H/o CVA in past.  Runny nose that his chronic, and flares up when seasons change.   dm2- diet modfications. 131 in am today. [id number].  No h/o smoking.   Medical History Duel has a past medical history of Arthritis, Diabetes mellitus without complication ([facility] History of elevated PSA, Hyperlipidemia, Hypertension, and Stroke ([facility]   Outpatient Encounter Medications as of 08/24/2020  Medication Sig  . fluticasone (FLONASE) 50 MCG/ACT nasal spray Place 2 sprays into both nostrils daily.  . Marland KitchenCCU-CHEK SOFTCLIX LANCETS lancets USE AS DIRECTED ONCE DAILY  . amLODipine (NORVASC) 2.5 MG tablet Take 1 tablet (2.5 mg total) by mouth in the morning and at bedtime. TAKE 1 TABLET(2.5 MG) BY MOUTH AT BEDTIME.  . Marland Kitchenholecalciferol (VITAMIN D-3) 125 MCG (5000 UT) TABS Take by mouth.  . dorzolamide-timolol (COSOPT) 22.3-6.8 MG/ML ophthalmic solution Place 1 drop into both eyes daily.  . Marland Kitchenlucose blood (ACCU-CHEK AVIVA PLUS) test strip USE TO CHECK BLOOD SUGAR EVERY DAY  . latanoprost (XALATAN) 0.005 % ophthalmic solution INT 1 GTT IN OU QD IN THE EVE  . losartan (COZAAR) 100 MG tablet TAKE 1 TABLET(100 MG) BY MOUTH DAILY  . OVER THE COUNTER MEDICATION Zinc  Vit b12 Multivitamin  . pantoprazole (PROTONIX) 40 MG tablet Take 1 tablet (40 mg total) by mouth daily.  . pravastatin (PRAVACHOL) 40 MG tablet TAKE 1 TABLET(40 MG) BY MOUTH DAILY  . promethazine-dextromethorphan (PROMETHAZINE-DM) 6.25-15 MG/5ML syrup Take 5 mLs by mouth at bedtime and may repeat dose one time if needed.  . [DISCONTINUED]  Accu-Chek Softclix Lancets lancets USE ONCE DAILY AS DIRECTED  . [DISCONTINUED] amLODipine (NORVASC) 2.5 MG tablet TAKE 1 TABLET(2.5 MG) BY MOUTH AT BEDTIME.  Needs appt.  . [DISCONTINUED] benzonatate (TESSALON) 100 MG capsule Take 2 capsules (200 mg total) by mouth 3 (three) times daily as needed for cough.  . [DISCONTINUED] doxycycline (VIBRAMYCIN) 100 MG capsule Take 1 capsule (100 mg total) by mouth 2 (two) times daily.  . [DISCONTINUED] ketoconazole (NIZORAL) 2 % cream Apply 1 application topically 2 (two) times daily. Affected area  . [DISCONTINUED] losartan (COZAAR) 100 MG tablet TAKE 1 TABLET(100 MG) BY MOUTH DAILY   No facility-administered encounter medications on file as of 08/24/2020.     Review of Systems  Constitutional: Negative for chills and fever.  HENT: Positive for rhinorrhea. Negative for congestion and sore throat.   Respiratory: Negative for cough, shortness of breath and wheezing.   Cardiovascular: Negative for chest pain and leg swelling.  Gastrointestinal: Negative for abdominal pain, diarrhea, nausea and vomiting.  Genitourinary: Negative for dysuria and frequency.  Skin: Negative for rash.  Neurological: Negative for dizziness, weakness and headaches.     Vitals BP (!) 159/80   Pulse 71   Temp (!) 97.5 F (36.4 C)   Ht '5\' 5"'  (1.651 m)   Wt 160 lb (72.6 kg)   SpO2 93%   BMI 26.63 kg/m   Objective:   Physical Exam Vitals and nursing note reviewed.  Constitutional:  General: He is not in acute distress.    Appearance: Normal appearance. He is not ill-appearing.  HENT:     Head: Normocephalic.     Nose: Nose normal. No congestion.     Mouth/Throat:     Mouth: Mucous membranes are moist.     Pharynx: No oropharyngeal exudate.  Eyes:     Extraocular Movements: Extraocular movements intact.     Conjunctiva/sclera: Conjunctivae normal.     Pupils: Pupils are equal, round, and reactive to light.  Cardiovascular:     Rate and Rhythm: Normal  rate and regular rhythm.     Pulses: Normal pulses.     Heart sounds: Normal heart sounds. No murmur heard.   Pulmonary:     Effort: Pulmonary effort is normal.     Breath sounds: Normal breath sounds. No wheezing, rhonchi or rales.  Musculoskeletal:        General: Normal range of motion.     Right lower leg: No edema.     Left lower leg: No edema.  Skin:    General: Skin is warm and dry.     Findings: No rash.  Neurological:     General: No focal deficit present.     Mental Status: He is alert and oriented to person, place, and time.     Cranial Nerves: No cranial nerve deficit.  Psychiatric:        Mood and Affect: Mood normal.        Behavior: Behavior normal.        Thought Content: Thought content normal.        Judgment: Judgment normal.      Assessment and Plan   1. Type 2 diabetes mellitus without complication, without long-term current use of insulin (HCC) - Hemoglobin A1c  2. Essential hypertension, benign - CMP14+EGFR - CBC  3. Advance directive discussed with patient    Spoke with wife and pt about advanced directives, per wife wanting to have some documents filled out.  Does not want life support for " long length of time." Pt wanting a full code and wanting ventilator and cpr done if needed on arrival to ER. Wife is POA. Not wanting something prolonged.  Pt wanting things done if will be beneficial but doesn't want prolonged on ventilator.   htn- uncontrolled, will increase amlodipine to 2.70m bid instead of nightly.  Cont with losartan 1068min am. Keep recheck on bp at home and call if seeing number over 140/85 or above or under 100/70 or under.  DM2- stable, cont. Recheck a1c.  Cont diet modifications.  Return in about 6 months (around 02/24/2021) for rechk bp.

## 2020-08-25 LAB — CMP14+EGFR
ALT: 11 IU/L (ref 0–44)
AST: 10 IU/L (ref 0–40)
Albumin/Globulin Ratio: 1.8 (ref 1.2–2.2)
Albumin: 4.2 g/dL (ref 3.6–4.6)
Alkaline Phosphatase: 93 IU/L (ref 44–121)
BUN/Creatinine Ratio: 21 (ref 10–24)
BUN: 19 mg/dL (ref 8–27)
Bilirubin Total: 0.3 mg/dL (ref 0.0–1.2)
CO2: 21 mmol/L (ref 20–29)
Calcium: 9.4 mg/dL (ref 8.6–10.2)
Chloride: 100 mmol/L (ref 96–106)
Creatinine, Ser: 0.9 mg/dL (ref 0.76–1.27)
Globulin, Total: 2.3 g/dL (ref 1.5–4.5)
Glucose: 129 mg/dL — ABNORMAL HIGH (ref 65–99)
Potassium: 4.2 mmol/L (ref 3.5–5.2)
Sodium: 140 mmol/L (ref 134–144)
Total Protein: 6.5 g/dL (ref 6.0–8.5)
eGFR: 82 mL/min/{1.73_m2} (ref >59)

## 2020-08-25 LAB — CBC
Hematocrit: 41.7 % (ref 37.5–51.0)
Hemoglobin: 13.3 g/dL (ref 13.0–17.7)
MCH: 30.4 pg (ref 26.6–33.0)
MCHC: 31.9 g/dL (ref 31.5–35.7)
MCV: 95 fL (ref 79–97)
Platelets: 371 10*3/uL (ref 150–450)
RBC: 4.37 x10E6/uL (ref 4.14–5.80)
RDW: 12.6 % (ref 11.6–15.4)
WBC: 9 10*3/uL (ref 3.4–10.8)

## 2020-08-25 LAB — HEMOGLOBIN A1C
Est. average glucose Bld gHb Est-mCnc: 154 mg/dL
Hgb A1c MFr Bld: 7 % — ABNORMAL HIGH (ref 4.8–5.6)

## 2020-09-13 ENCOUNTER — Other Ambulatory Visit: Payer: Self-pay | Admitting: Nurse Practitioner

## 2020-10-28 ENCOUNTER — Encounter: Payer: Self-pay | Admitting: Nurse Practitioner

## 2020-10-28 ENCOUNTER — Other Ambulatory Visit: Payer: Self-pay

## 2020-10-28 ENCOUNTER — Ambulatory Visit (INDEPENDENT_AMBULATORY_CARE_PROVIDER_SITE_OTHER): Payer: Medicare Other | Admitting: Nurse Practitioner

## 2020-10-28 VITALS — BP 138/86 | Ht 65.0 in | Wt 163.6 lb

## 2020-10-28 DIAGNOSIS — I10 Essential (primary) hypertension: Secondary | ICD-10-CM | POA: Diagnosis not present

## 2020-10-28 DIAGNOSIS — E785 Hyperlipidemia, unspecified: Secondary | ICD-10-CM | POA: Diagnosis not present

## 2020-10-28 DIAGNOSIS — K219 Gastro-esophageal reflux disease without esophagitis: Secondary | ICD-10-CM | POA: Diagnosis not present

## 2020-10-28 DIAGNOSIS — E119 Type 2 diabetes mellitus without complications: Secondary | ICD-10-CM

## 2020-10-28 NOTE — Progress Notes (Signed)
   Subjective:    Patient ID: Mark Brady, male    DOB: 02/23/1930, 87 y.o.   MRN: [id number]  Hypertension This is a chronic problem. The current episode started more than 1 year ago. Risk factors for coronary artery disease include dyslipidemia, male gender and sedentary lifestyle. Treatments tried: losartan, norvasc. There are no compliance problems.    Presents with his wife for routine follow-up.  Adherent to medication regimen.  Appetite and diet have improved.  Continues to have chronic drooling but this has slightly improved.  Gets regular eye exams. BP at home 140-150/80. Denies any chest pain/ischemic type pain or unusual shortness of breath. Reflux controlled with pantoprazole. Limited activity but some walking.      Objective:   Physical Exam NAD.  Alert, oriented.  Speech slow but clear.  Making good eye contact.  Lungs clear.  Heart regular rate rhythm.  Abdomen soft nondistended nontender.  Gait slow but steady. Today's Vitals   10/28/20 1036  BP: 138/86  Weight: 163 lb 9.6 oz (74.2 kg)  Height: 5' 5" (1.651 m)   Body mass index is 27.22 kg/m. Results for orders placed or performed in visit on 08/24/20  CMP14+EGFR  Result Value Ref Range   Glucose 129 (H) 65 - 99 mg/dL   BUN 19 8 - 27 mg/dL   Creatinine, Ser 0.90 0.76 - 1.27 mg/dL   eGFR 82 >59 mL/min/1.73   BUN/Creatinine Ratio 21 10 - 24   Sodium 140 134 - 144 mmol/L   Potassium 4.2 3.5 - 5.2 mmol/L   Chloride 100 96 - 106 mmol/L   CO2 21 20 - 29 mmol/L   Calcium 9.4 8.6 - 10.2 mg/dL   Total Protein 6.5 6.0 - 8.5 g/dL   Albumin 4.2 3.6 - 4.6 g/dL   Globulin, Total 2.3 1.5 - 4.5 g/dL   Albumin/Globulin Ratio 1.8 1.2 - 2.2   Bilirubin Total 0.3 0.0 - 1.2 mg/dL   Alkaline Phosphatase 93 44 - 121 IU/L   AST 10 0 - 40 IU/L   ALT 11 0 - 44 IU/L  CBC  Result Value Ref Range   WBC 9.0 3.4 - 10.8 x10E3/uL   RBC 4.37 4.14 - 5.80 x10E6/uL   Hemoglobin 13.3 13.0 - 17.7 g/dL   Hematocrit 41.7 37.5 - 51.0 %    MCV 95 79 - 97 fL   MCH 30.4 26.6 - 33.0 pg   MCHC 31.9 31.5 - 35.7 g/dL   RDW 12.6 11.6 - 15.4 %   Platelets 371 150 - 450 x10E3/uL  Hemoglobin A1c  Result Value Ref Range   Hgb A1c MFr Bld 7.0 (H) 4.8 - 5.6 %   Est. average glucose Bld gHb Est-mCnc 154 mg/dL   Reviewed labs from May with patient and his wife.     Assessment & Plan:   Problem List Items Addressed This Visit       Cardiovascular and Mediastinum   Essential hypertension, benign - Primary     Digestive   Gastroesophageal reflux disease without esophagitis     Endocrine   Diabetes (HCC)     Other   Hyperlipidemia LDL goal <100   Continue current medication regimen. A1c went up slightly but no changes in medication regimen due to his age and risk of hypoglycemia.  Continue healthy diet and walking as tolerated. Return in about 3 months (around 01/28/2021).  

## 2020-10-29 ENCOUNTER — Encounter: Payer: Self-pay | Admitting: Nurse Practitioner

## 2020-11-22 ENCOUNTER — Other Ambulatory Visit: Payer: Self-pay | Admitting: Family Medicine

## 2020-12-10 ENCOUNTER — Other Ambulatory Visit: Payer: Self-pay | Admitting: Family Medicine

## 2021-01-10 ENCOUNTER — Telehealth: Payer: Self-pay

## 2021-01-10 ENCOUNTER — Ambulatory Visit (INDEPENDENT_AMBULATORY_CARE_PROVIDER_SITE_OTHER): Payer: Medicare Other

## 2021-01-10 ENCOUNTER — Other Ambulatory Visit: Payer: Self-pay

## 2021-01-10 VITALS — BP 126/68 | HR 84 | Temp 97.8°F | Ht 65.0 in | Wt 161.4 lb

## 2021-01-10 DIAGNOSIS — Z0000 Encounter for general adult medical examination without abnormal findings: Secondary | ICD-10-CM | POA: Diagnosis not present

## 2021-01-10 NOTE — Telephone Encounter (Signed)
Former pt of Dr. Lovena Le. Pt with c/o excessive drooling. Pt's wife states they were advised to try OTC allergy medication in the past but it did not help. Is there anything you can recommend? Thank you!

## 2021-01-10 NOTE — Telephone Encounter (Signed)
Left message to return call 

## 2021-01-10 NOTE — Patient Instructions (Signed)
[name] , Thank you for taking time to come for your Medicare Wellness Visit. I appreciate your ongoing commitment to your health goals. Please review the following plan we discussed and let me know if I can assist you in the future.   Screening recommendations/referrals: Colonoscopy: No longer required due to age Recommended yearly ophthalmology/optometry visit for glaucoma screening and checkup Recommended yearly dental visit for hygiene and checkup  Vaccinations: Influenza vaccine: Done 03/08/2020. Repeat in 10 years  Pneumococcal vaccine: Done 12/16/11 08/02/14 Tdap vaccine: Done 08/09/2008 Repeat in 10 years  Shingles vaccine: Shingrix discussed. Please contact your pharmacy for coverage information.     Covid-19: Done 07/03/2019, 06/12/2019  Advanced directives: Please bring a copy of your health care power of attorney and living will to the office to be added to your chart at your convenience.   Conditions/risks identified: Aim for 30 minutes of exercise or walking each day, drink 6-8 glasses of water and eat lots of fruits and vegetables.   Next appointment: Follow up in one year for your annual wellness visit. 2023  Preventive Care 65 Years and Older, Male  Preventive care refers to lifestyle choices and visits with your health care provider that can promote health and wellness. What does preventive care include? A yearly physical exam. This is also called an annual well check. Dental exams once or twice a year. Routine eye exams. Ask your health care provider how often you should have your eyes checked. Personal lifestyle choices, including: Daily care of your teeth and gums. Regular physical activity. Eating a healthy diet. Avoiding tobacco and drug use. Limiting alcohol use. Practicing safe sex. Taking low doses of aspirin every day. Taking vitamin and mineral supplements as recommended by your health care provider. What happens during an annual well check? The  services and screenings done by your health care provider during your annual well check will depend on your age, overall health, lifestyle risk factors, and family history of disease. Counseling  Your health care provider may ask you questions about your: Alcohol use. Tobacco use. Drug use. Emotional well-being. Home and relationship well-being. Sexual activity. Eating habits. History of falls. Memory and ability to understand (cognition). Work and work Statistician. Screening  You may have the following tests or measurements: Height, weight, and BMI. Blood pressure. Lipid and cholesterol levels. These may be checked every 5 years, or more frequently if you are over 28 years old. Skin check. Lung cancer screening. You may have this screening every year starting at age 55 if you have a 30-pack-year history of smoking and currently smoke or have quit within the past 15 years. Fecal occult blood test (FOBT) of the stool. You may have this test every year starting at age 62. Flexible sigmoidoscopy or colonoscopy. You may have a sigmoidoscopy every 5 years or a colonoscopy every 10 years starting at age 76. Prostate cancer screening. Recommendations will vary depending on your family history and other risks. Hepatitis C blood test. Hepatitis B blood test. Sexually transmitted disease (STD) testing. Diabetes screening. This is done by checking your blood sugar (glucose) after you have not eaten for a while (fasting). You may have this done every 1-3 years. Abdominal aortic aneurysm (AAA) screening. You may need this if you are a current or former smoker. Osteoporosis. You may be screened starting at age 7 if you are at high risk. Talk with your health care provider about your test results, treatment options, and if necessary, the need for more tests.  Vaccines  Your health care provider may recommend certain vaccines, such as: Influenza vaccine. This is recommended every year. Tetanus,  diphtheria, and acellular pertussis (Tdap, Td) vaccine. You may need a Td booster every 10 years. Zoster vaccine. You may need this after age 57. Pneumococcal 13-valent conjugate (PCV13) vaccine. One dose is recommended after age 36. Pneumococcal polysaccharide (PPSV23) vaccine. One dose is recommended after age 59. Talk to your health care provider about which screenings and vaccines you need and how often you need them. This information is not intended to replace advice given to you by your health care provider. Make sure you discuss any questions you have with your health care provider. Document Released: 04/29/2015 Document Revised: 12/21/2015 Document Reviewed: 02/01/2015 Elsevier Interactive Patient Education  2017 [place] Prevention in the Home Falls can cause injuries. They can happen to people of all ages. There are many things you can do to make your home safe and to help prevent falls. What can I do on the outside of my home? Regularly fix the edges of walkways and driveways and fix any cracks. Remove anything that might make you trip as you walk through a door, such as a raised step or threshold. Trim any bushes or trees on the path to your home. Use bright outdoor lighting. Clear any walking paths of anything that might make someone trip, such as rocks or tools. Regularly check to see if handrails are loose or broken. Make sure that both sides of any steps have handrails. Any raised decks and porches should have guardrails on the edges. Have any leaves, snow, or ice cleared regularly. Use sand or salt on walking paths during winter. Clean up any spills in your garage right away. This includes oil or grease spills. What can I do in the bathroom? Use night lights. Install grab bars by the toilet and in the tub and shower. Do not use towel bars as grab bars. Use non-skid mats or decals in the tub or shower. If you need to sit down in the shower, use a plastic,  non-slip stool. Keep the floor dry. Clean up any water that spills on the floor as soon as it happens. Remove soap buildup in the tub or shower regularly. Attach bath mats securely with double-sided non-slip rug tape. Do not have throw rugs and other things on the floor that can make you trip. What can I do in the bedroom? Use night lights. Make sure that you have a light by your bed that is easy to reach. Do not use any sheets or blankets that are too big for your bed. They should not hang down onto the floor. Have a firm chair that has side arms. You can use this for support while you get dressed. Do not have throw rugs and other things on the floor that can make you trip. What can I do in the kitchen? Clean up any spills right away. Avoid walking on wet floors. Keep items that you use a lot in easy-to-reach places. If you need to reach something above you, use a strong step stool that has a grab bar. Keep electrical cords out of the way. Do not use floor polish or wax that makes floors slippery. If you must use wax, use non-skid floor wax. Do not have throw rugs and other things on the floor that can make you trip. What can I do with my stairs? Do not leave any items on the stairs. Make sure that  there are handrails on both sides of the stairs and use them. Fix handrails that are broken or loose. Make sure that handrails are as long as the stairways. Check any carpeting to make sure that it is firmly attached to the stairs. Fix any carpet that is loose or worn. Avoid having throw rugs at the top or bottom of the stairs. If you do have throw rugs, attach them to the floor with carpet tape. Make sure that you have a light switch at the top of the stairs and the bottom of the stairs. If you do not have them, ask someone to add them for you. What else can I do to help prevent falls? Wear shoes that: Do not have high heels. Have rubber bottoms. Are comfortable and fit you well. Are closed  at the toe. Do not wear sandals. If you use a stepladder: Make sure that it is fully opened. Do not climb a closed stepladder. Make sure that both sides of the stepladder are locked into place. Ask someone to hold it for you, if possible. Clearly mark and make sure that you can see: Any grab bars or handrails. First and last steps. Where the edge of each step is. Use tools that help you move around (mobility aids) if they are needed. These include: Canes. Walkers. Scooters. Crutches. Turn on the lights when you go into a dark area. Replace any light bulbs as soon as they burn out. Set up your furniture so you have a clear path. Avoid moving your furniture around. If any of your floors are uneven, fix them. If there are any pets around you, be aware of where they are. Review your medicines with your doctor. Some medicines can make you feel dizzy. This can increase your chance of falling. Ask your doctor what other things that you can do to help prevent falls. This information is not intended to replace advice given to you by your health care provider. Make sure you discuss any questions you have with your health care provider. Document Released: 01/27/2009 Document Revised: 09/08/2015 Document Reviewed: 05/07/2014 Elsevier Interactive Patient Education  2017 [employer].

## 2021-01-10 NOTE — Progress Notes (Signed)
Subjective:   Mark Brady is a 87 y.o. male who presents for Medicare Annual/Subsequent preventive examination.  Review of Systems     Cardiac Risk Factors include: advanced age (>86men, >67 women)     Objective:    Today's Vitals   01/10/21 1542  BP: 126/68  Pulse: 84  Temp: 97.8 F (36.6 C)  SpO2: 97%  Weight: 161 lb 6.4 oz (73.2 kg)  Height: 5\' 5"  (1.651 m)   Body mass index is 26.86 kg/m.  Advanced Directives 01/10/2021 08/24/2020 03/13/2017 04/03/2015 09/05/2014 09/05/2014 01/21/2014  Does Patient Have a Medical Advance Directive? No No Yes Yes Yes Yes No  Type of Advance Directive - Scientist, water quality of [place];Living will [place] in Chart? - - No - copy requested - No - copy requested - -  Would patient like information on creating a medical advance directive? No - Patient declined - - - - - No - patient declined information    Current Medications (verified) Outpatient Encounter Medications as of 01/10/2021  Medication Sig   ACCU-CHEK SOFTCLIX LANCETS lancets USE AS DIRECTED ONCE DAILY   amLODipine (NORVASC) 2.5 MG tablet Take 1 tablet (2.5 mg total) by mouth 2 (two) times daily.   Cholecalciferol (VITAMIN D-3) 125 MCG (5000 UT) TABS Take by mouth.   dorzolamide-timolol (COSOPT) 22.3-6.8 MG/ML ophthalmic solution Place 1 drop into both eyes daily.   fluticasone (FLONASE) 50 MCG/ACT nasal spray Place 2 sprays into both nostrils daily.   glucose blood (ACCU-CHEK AVIVA PLUS) test strip USE TO CHECK BLOOD SUGAR DAILY   latanoprost (XALATAN) 0.005 % ophthalmic solution INT 1 GTT IN OU QD IN THE EVE   losartan (COZAAR) 100 MG tablet TAKE 1 TABLET(100 MG) BY MOUTH DAILY   pantoprazole (PROTONIX) 40 MG tablet TAKE 1 TABLET(40 MG) BY MOUTH DAILY   pravastatin (PRAVACHOL) 40 MG tablet TAKE 1 TABLET(40 MG) BY MOUTH DAILY   [DISCONTINUED] OVER THE COUNTER MEDICATION Zinc  Vit  b12 Multivitamin   [DISCONTINUED] promethazine-dextromethorphan (PROMETHAZINE-DM) 6.25-15 MG/5ML syrup Take 5 mLs by mouth at bedtime and may repeat dose one time if needed. (Patient not taking: Reported on 10/28/2020)   No facility-administered encounter medications on file as of 01/10/2021.    Allergies (verified) Penicillins and Aspirin   History: Past Medical History:  Diagnosis Date   Arthritis    Diabetes mellitus without complication (HCC)    diet controlled   History of elevated PSA    Hyperlipidemia    Hypertension    Stroke [facility])    Past Surgical History:  Procedure Laterality Date   CATARACT EXTRACTION W/PHACO Right 01/26/2014   Procedure: CATARACT EXTRACTION PHACO AND INTRAOCULAR LENS PLACEMENT ([employer]);  Surgeon: Elta Guadeloupe T. Gershon Crane, MD;  Location: [facility];  Service: Ophthalmology;  Laterality: Right;  CDE 6.53   CATARACT EXTRACTION W/PHACO Left 02/09/2014   Procedure: CATARACT EXTRACTION PHACO AND INTRAOCULAR LENS PLACEMENT ([facility]);  Surgeon: Elta Guadeloupe T. Gershon Crane, MD;  Location: [facility];  Service: Ophthalmology;  Laterality: Left;  CDE:5.58   COLONOSCOPY     YAG LASER APPLICATION Right 5/62/5638   Procedure: YAG LASER APPLICATION;  Surgeon: Rutherford Guys, MD;  Location: [facility];  Service: Ophthalmology;  Laterality: Right;   Family History  Problem Relation Age of Onset   Arthritis Other    Social History   Socioeconomic History   Marital status: Married    Spouse name: Vaughan Basta  Number of children: 5   Years of education: Not on file   Highest education level: Not on file  Occupational History   Not on file  Tobacco Use   Smoking status: Never   Smokeless tobacco: Never  Substance and Sexual Activity   Alcohol use: No   Drug use: No   Sexual activity: Yes    Birth control/protection: None  Other Topics Concern   Not on file  Social History Narrative   5 children, all live nearby.   Grandchildren and great grandchildren.   Social Determinants of Health   Financial  Resource Strain: Low Risk    Difficulty of Paying Living Expenses: Not hard at all  Food Insecurity: No Food Insecurity   Worried About Charity fundraiser in the Last Year: Never true   [place] in the Last Year: Never true  Transportation Needs: Unknown   Lack of Transportation (Medical): No   Lack of Transportation (Non-Medical): Not on file  Physical Activity: Sufficiently Active   Days of Exercise per Week: 5 days   Minutes of Exercise per Session: 30 min  Stress: No Stress Concern Present   Feeling of Stress : Not at all  Social Connections: Socially Integrated   Frequency of Communication with Friends and Family: More than three times a week   Frequency of Social Gatherings with Friends and Family: More than three times a week   Attends Religious Services: More than 4 times per year   Active Member of [employer] or Organizations: Yes   Attends Music therapist: More than 4 times per year   Marital Status: Married    Tobacco Counseling Counseling given: Not Answered   Clinical Intake:  Pre-visit preparation completed: Yes  Pain : No/denies pain     BMI - recorded: 26.86 Nutritional Status: BMI 25 -29 Overweight Nutritional Risks: None Diabetes: Yes CBG done?: No Did pt. bring in CBG monitor from home?: No  How often do you need to have someone help you when you read instructions, pamphlets, or other written materials from your doctor or pharmacy?: 1 - Never  Diabetic?Nutrition Risk Assessment:  Has the patient had any N/V/D within the last 2 months?  No  Does the patient have any non-healing wounds?  No  Has the patient had any unintentional weight loss or weight gain?  No   Diabetes:  Is the patient diabetic?  No  If diabetic, was a CBG obtained today?  No  Did the patient bring in their glucometer from home?  No  How often do you monitor your CBG's? 2-4 times per week.   Financial Strains and Diabetes Management:  Are you having any  financial strains with the device, your supplies or your medication? No .  Does the patient want to be seen by Chronic Care Management for management of their diabetes?  No  Would the patient like to be referred to a Nutritionist or for Diabetic Management?  No   Diabetic Exams:  Diabetic Eye Exam: Completed 05/13/20. Pt has been advised about the importance in completing this exam.  Diabetic Foot Exam: Completed 05/13/20. Pt has been advised about the importance in completing this exam.    Interpreter Needed?: No  Information entered by :: MJ Klark Vanderhoef, LPN   Activities of Daily Living In your present state of health, do you have any difficulty performing the following activities: 01/10/2021  Hearing? N  Vision? N  Difficulty concentrating or making decisions? N  Walking or climbing stairs? Y  Dressing or bathing? N  Doing errands, shopping? N  Preparing Food and eating ? N  Using the Toilet? N  In the past six months, have you accidently leaked urine? N  Do you have problems with loss of bowel control? N  Managing your Medications? N  Managing your Finances? N  Housekeeping or managing your Housekeeping? N  Some recent data might be hidden    Patient Care Team: Erven Colla, DO as PCP - General (Family Medicine)  Indicate any recent Medical Services you may have received from other than [facility] providers in the past year (date may be approximate).     Assessment:   This is a routine wellness examination for Mark Brady.  Hearing/Vision screen Hearing Screening - Comments:: No hearing issues. Vision Screening - Comments:: Readers. Up to date on eye exam. [facility].  Dietary issues and exercise activities discussed: Current Exercise Habits: Home exercise routine, Type of exercise: walking, Time (Minutes): 30, Frequency (Times/Week): 5, Weekly Exercise (Minutes/Week): 150, Intensity: Mild, Exercise limited by: cardiac condition(s)   Goals Addressed              This Visit's Progress    Have 3 meals a day       Continue with healthy diet. Live to be 89.       Depression Screen PHQ 2/9 Scores 01/10/2021 10/28/2020 11/23/2019 05/13/2018 05/13/2017  PHQ - 2 Score 0 0 0 0 0    Fall Risk Fall Risk  01/10/2021 10/28/2020 11/23/2019 08/19/2019 05/13/2018  Falls in the past year? 1 0 0 0 0  Number falls in past yr: 0 - - - -  Injury with Fall? 1 - - - -  Risk for fall due to : History of fall(s);Impaired balance/gait;Impaired mobility;Impaired vision Impaired balance/gait - Impaired mobility -  Follow up Falls prevention discussed Falls evaluation completed Falls evaluation completed Falls evaluation completed;Education provided -    FALL RISK PREVENTION PERTAINING TO THE HOME:  Any stairs in or around the home? No  If so, are there any without handrails? No  Home free of loose throw rugs in walkways, pet beds, electrical cords, etc? Yes  Adequate lighting in your home to reduce risk of falls? Yes   ASSISTIVE DEVICES UTILIZED TO PREVENT FALLS:  Life alert? Yes  Use of a cane, walker or w/c? Yes  Grab bars in the bathroom? No  Shower chair or bench in shower? Yes  Elevated toilet seat or a handicapped toilet? No   TIMED UP AND GO:  Was the test performed? Yes .  Length of time to ambulate 10 feet: 15 sec.   Gait slow and steady without use of assistive device  Cognitive Function: Normal cognitive status assessed by direct observation by this Nurse Health Advisor. No abnormalities found.          Immunizations Immunization History  Administered Date(s) Administered   Influenza,inj,Quad PF,6+ Mos 02/07/2016, 02/06/2017, 02/03/2018, 01/10/2019   Influenza-Unspecified 03/08/2020   Moderna Sars-Covid-2 Vaccination 06/12/2019, 07/03/2019   Pneumococcal Conjugate-13 08/02/2014   Pneumococcal Polysaccharide-23 12/16/2011   Td 08/09/2008    TDAP status: Due, Education has been provided regarding the importance of this vaccine. Advised may  receive this vaccine at local pharmacy or Health Dept. Aware to provide a copy of the vaccination record if obtained from local pharmacy or Health Dept. Verbalized acceptance and understanding.  Flu Vaccine status: Due, Education has been provided regarding the importance of this vaccine.  Advised may receive this vaccine at local pharmacy or Health Dept. Aware to provide a copy of the vaccination record if obtained from local pharmacy or Health Dept. Verbalized acceptance and understanding.  Pneumococcal vaccine status: Up to date  Covid-19 vaccine status: Information provided on how to obtain vaccines.   Qualifies for Shingles Vaccine? Yes   Zostavax completed No   Shingrix Completed?: No.    Education has been provided regarding the importance of this vaccine. Patient has been advised to call insurance company to determine out of pocket expense if they have not yet received this vaccine. Advised may also receive vaccine at local pharmacy or Health Dept. Verbalized acceptance and understanding.  Screening Tests Health Maintenance  Topic Date Due   COVID-19 Vaccine (3 - Mixed Product risk series) 07/31/2019   INFLUENZA VACCINE  11/14/2020   Zoster Vaccines- Shingrix (1 of 2) 01/28/2021 (Originally 11/27/1948)   TETANUS/TDAP  05/14/2021 (Originally 08/10/2018)   HEMOGLOBIN A1C  02/24/2021   FOOT EXAM  05/13/2021   OPHTHALMOLOGY EXAM  05/13/2021   HPV VACCINES  Aged Out    Health Maintenance  Health Maintenance Due  Topic Date Due   COVID-19 Vaccine (3 - Mixed Product risk series) 07/31/2019   INFLUENZA VACCINE  11/14/2020    Colorectal cancer screening: No longer required.   Lung Cancer Screening: (Low Dose CT Chest recommended if Age 59-80 years, 30 pack-year currently smoking OR have quit w/in 15years.) does not qualify.    Additional Screening:  Hepatitis C Screening: does not qualify  Vision Screening: Recommended annual ophthalmology exams for early detection of glaucoma  and other disorders of the eye. Is the patient up to date with their annual eye exam?  Yes  Who is the provider or what is the name of the office in which the patient attends annual eye exams? [facility] If pt is not established with a provider, would they like to be referred to a provider to establish care? No .   Dental Screening: Recommended annual dental exams for proper oral hygiene  Community Resource Referral / Chronic Care Management: CRR required this visit?  No   CCM required this visit?  No      Plan:     I have personally reviewed and noted the following in the patient's chart:   Medical and social history Use of alcohol, tobacco or illicit drugs  Current medications and supplements including opioid prescriptions. Patient is not currently taking opioid prescriptions. Functional ability and status Nutritional status Physical activity Advanced directives List of other physicians Hospitalizations, surgeries, and ER visits in previous 12 months Vitals Screenings to include cognitive, depression, and falls Referrals and appointments  In addition, I have reviewed and discussed with patient certain preventive protocols, quality metrics, and best practice recommendations. A written personalized care plan for preventive services as well as general preventive health recommendations were provided to patient.     Chriss Driver, LPN   4/59/9774   Nurse Notes: Pt states he is doing well. Accompanied by wife, Vaughan Basta. Due for flu, tdap and covid boosters. Discussed how to obtain. Up to date on health maintenance.

## 2021-01-10 NOTE — Telephone Encounter (Signed)
Unfortunately off the top my head I do not have a good answer for this. It would be wise to follow through with Dr. Lacinda Axon at his scheduled visit hopefully he will have a reasonable approach thank you

## 2021-01-18 NOTE — Telephone Encounter (Signed)
Left message to return call 

## 2021-01-19 LAB — HM DIABETES EYE EXAM

## 2021-01-19 NOTE — Telephone Encounter (Signed)
Left detailed message on answering machine per DPR.

## 2021-02-02 ENCOUNTER — Other Ambulatory Visit: Payer: Self-pay

## 2021-02-02 ENCOUNTER — Ambulatory Visit (INDEPENDENT_AMBULATORY_CARE_PROVIDER_SITE_OTHER): Payer: Medicare Other | Admitting: Family Medicine

## 2021-02-02 VITALS — BP 161/74 | HR 68 | Temp 97.3°F | Ht 65.0 in | Wt 163.0 lb

## 2021-02-02 DIAGNOSIS — Z23 Encounter for immunization: Secondary | ICD-10-CM | POA: Diagnosis not present

## 2021-02-02 DIAGNOSIS — E119 Type 2 diabetes mellitus without complications: Secondary | ICD-10-CM | POA: Diagnosis not present

## 2021-02-02 DIAGNOSIS — L989 Disorder of the skin and subcutaneous tissue, unspecified: Secondary | ICD-10-CM

## 2021-02-02 DIAGNOSIS — E785 Hyperlipidemia, unspecified: Secondary | ICD-10-CM

## 2021-02-02 DIAGNOSIS — I10 Essential (primary) hypertension: Secondary | ICD-10-CM | POA: Diagnosis not present

## 2021-02-02 DIAGNOSIS — R29818 Other symptoms and signs involving the nervous system: Secondary | ICD-10-CM

## 2021-02-02 DIAGNOSIS — K117 Disturbances of salivary secretion: Secondary | ICD-10-CM | POA: Diagnosis not present

## 2021-02-02 DIAGNOSIS — Z8673 Personal history of transient ischemic attack (TIA), and cerebral infarction without residual deficits: Secondary | ICD-10-CM | POA: Insufficient documentation

## 2021-02-02 DIAGNOSIS — R259 Unspecified abnormal involuntary movements: Secondary | ICD-10-CM | POA: Insufficient documentation

## 2021-02-02 MED ORDER — GLYCOPYRROLATE 1 MG PO TABS: 1.0000 mg | ORAL_TABLET | Freq: Three times a day (TID) | ORAL | 0 refills | Status: DC

## 2021-02-02 MED ORDER — MUPIROCIN 2 % EX OINT: 1.0000 "application " | TOPICAL_OINTMENT | Freq: Every day | CUTANEOUS | 0 refills | Status: DC | PRN

## 2021-02-02 MED ORDER — AMLODIPINE BESYLATE 10 MG PO TABS: 10.0000 mg | ORAL_TABLET | Freq: Every day | ORAL | 3 refills | Status: DC

## 2021-02-02 NOTE — Patient Instructions (Addendum)
I am placing a referral to dermatology as well as neurology.  Use the ointment for his buttock sore. Keep a close eye.  I have increased his Norvasc.   I have sent in a new medication to see if we can help the secretions.  Follow up in 6 months.

## 2021-02-02 NOTE — Assessment & Plan Note (Signed)
Given patient's constellation of symptoms/presentation I believe that he suffers from Parkinson's. Trial of glycopyrrolate for sialorrhea as this is quite troublesome for him.

## 2021-02-02 NOTE — Assessment & Plan Note (Signed)
Uncontrolled.  Increasing Norvasc to 10 mg daily.  Continue losartan.

## 2021-02-02 NOTE — Assessment & Plan Note (Signed)
At goal. Will continue to monitor.

## 2021-02-02 NOTE — Assessment & Plan Note (Addendum)
Hyperkeratotic skin lesion.  Referring to dermatology.  Bactroban ointment to the buttock area.

## 2021-02-02 NOTE — Progress Notes (Signed)
Subjective:  Patient ID: Mark Brady, male    DOB: [birth year]  Age: 87 y.o. MRN: [id number]  CC: Chief Complaint  Patient presents with   sore on left hand, and on bottom    concerns with possible parkinsons     Referral to neurology    HPI:  87 year old male presents for follow-up.  Wife has concerns as mentioned above.  Skin lesion Patient has a chronic raised skin lesion on the dorsum of the left hand.  It is unclear how long this has been going on but it seems to have been present for quite some period of time. Area is firm.  Bothersome. Has not resolved.  Has seen dermatology before. He is a former Psychologist, sport and exercise.  He has had a lot of sun exposure. He also has an area of concern on his buttock.  Wife would like me to examine this today.  She states that the area scabs over and then seems to be doing well and then it recurs again.  Concern for Parkinson's Wife has noted a decline over the past several months. She states that he has a short stepped gait. He has significant drooling which is quite troublesome for him.  She states that he does not want to go to church or go out in public as it is embarrassing for him. Additionally, she states that he has little facial expression and seems to have difficulty getting up from a seated position. She has also noticed voice change. No tremor. She is concerned that he has Parkinson's disease.  She and the patient would like a referral to neurology.  Hypertension States that his blood pressures are elevated when she checks them at home. He is compliant with amlodipine 2.5 mg twice daily.  He is also on losartan.  Hyperlipidemia Stable, fairly well controlled given his age.  LDL 76.  On pravastatin.  No reported side effects from medication.  Diabetes Diet controlled.  Last A1c 7.0.  Patient Active Problem List   Diagnosis Date Noted   History of TIA (transient ischemic attack) 02/02/2021   Parkinsonian features 02/02/2021   Skin  lesion 02/02/2021   Sialorrhea 05/14/2020   Gastroesophageal reflux disease without esophagitis 05/13/2020   Glaucoma 11/23/2019   Diabetes ([place]) 10/19/2012   Essential hypertension, benign 09/28/2012   Hyperlipidemia 09/28/2012    Social Hx   Social History   Socioeconomic History   Marital status: Married    Spouse name: Vaughan Basta   Number of children: 5   Years of education: Not on file   Highest education level: Not on file  Occupational History   Not on file  Tobacco Use   Smoking status: Never   Smokeless tobacco: Never  Substance and Sexual Activity   Alcohol use: No   Drug use: No   Sexual activity: Yes    Birth control/protection: None  Other Topics Concern   Not on file  Social History Narrative   5 children, all live nearby.   Grandchildren and great grandchildren.   Social Determinants of Health   Financial Resource Strain: Low Risk    Difficulty of Paying Living Expenses: Not hard at all  Food Insecurity: No Food Insecurity   Worried About Charity fundraiser in the Last Year: Never true   [place] in the Last Year: Never true  Transportation Needs: Unknown   Lack of Transportation (Medical): No   Lack of Transportation (Non-Medical): Not on file  Physical Activity: Sufficiently Active  Days of Exercise per Week: 5 days   Minutes of Exercise per Session: 30 min  Stress: No Stress Concern Present   Feeling of Stress : Not at all  Social Connections: Socially Integrated   Frequency of Communication with Friends and Family: More than three times a week   Frequency of Social Gatherings with Friends and Family: More than three times a week   Attends Religious Services: More than 4 times per year   Active Member of [employer] or Organizations: Yes   Attends Music therapist: More than 4 times per year   Marital Status: Married    Review of Systems Per HPI  Objective:  BP (!) 161/74   Pulse 68   Temp (!) 97.3 F (36.3 C)   Ht 5\' 5"   (1.651 m)   Wt 163 lb (73.9 kg)   SpO2 98%   BMI 27.12 kg/m   BP/Weight 02/02/2021 01/10/2021 1/60/7371  Systolic BP 062 694 854  Diastolic BP 74 68 86  Wt. (Lbs) 163 161.4 163.6  BMI 27.12 26.86 27.22    Physical Exam Constitutional:      General: He is not in acute distress.    Appearance: Normal appearance.  HENT:     Head: Normocephalic and atraumatic.     Comments: Masked facie.     Mouth/Throat:     Pharynx: Oropharynx is clear.  Cardiovascular:     Rate and Rhythm: Normal rate and regular rhythm.  Pulmonary:     Effort: Pulmonary effort is normal.     Breath sounds: Normal breath sounds. No wheezing or rales.  Skin:    Comments: Dorsum of the hand with a discrete raised hyperkeratotic lesion.  Left buttock with small eschar noted.  Neurological:     Mental Status: He is alert.     Comments: Shuffling gait. No appreciable tremor. No current cogwheel rigidity. Sialorrhea.  Psychiatric:     Comments: Flat affect.    Lab Results  Component Value Date   WBC 9.0 08/24/2020   HGB 13.3 08/24/2020   HCT 41.7 08/24/2020   PLT 371 08/24/2020   GLUCOSE 129 (H) 08/24/2020   CHOL 156 08/19/2019   TRIG 135 08/19/2019   HDL 56 08/19/2019   LDLCALC 76 08/19/2019   ALT 11 08/24/2020   AST 10 08/24/2020   NA 140 08/24/2020   K 4.2 08/24/2020   CL 100 08/24/2020   CREATININE 0.90 08/24/2020   BUN 19 08/24/2020   CO2 21 08/24/2020   TSH 3.819 09/05/2014   INR 0.90 06/02/2010   HGBA1C 7.0 (H) 08/24/2020   MICROALBUR 10 01/31/2016     Assessment & Plan:   Problem List Items Addressed This Visit       Cardiovascular and Mediastinum   Essential hypertension, benign - Primary    Uncontrolled.  Increasing Norvasc to 10 mg daily.  Continue losartan.      Relevant Medications   amLODipine (NORVASC) 10 MG tablet     Digestive   Sialorrhea    Given patient's constellation of symptoms/presentation I believe that he suffers from Parkinson's. Trial of  glycopyrrolate for sialorrhea as this is quite troublesome for him.        Endocrine   Diabetes ([place])    At goal. Will continue to monitor.        Musculoskeletal and Integument   Skin lesion    Hyperkeratotic skin lesion.  Referring to dermatology.  Bactroban ointment to the buttock area.  Relevant Orders   Ambulatory referral to Dermatology     Other   Hyperlipidemia    Stable. Continue Pravastatin.      Relevant Medications   amLODipine (NORVASC) 10 MG tablet   Parkinsonian features    Based off history and physical exam, I am concerned that he has Parkinson's.  Referring to neurology.      Relevant Orders   Ambulatory referral to Neurology   Other Visit Diagnoses     Immunization due       Relevant Orders   Flu Vaccine QUAD High Dose(Fluad) (Completed)       Meds ordered this encounter  Medications   amLODipine (NORVASC) 10 MG tablet    Sig: Take 1 tablet (10 mg total) by mouth daily.    Dispense:  90 tablet    Refill:  3   mupirocin ointment (BACTROBAN) 2 %    Sig: Apply 1 application topically daily as needed.    Dispense:  30 g    Refill:  0   glycopyrrolate (ROBINUL) 1 MG tablet    Sig: Take 1 tablet (1 mg total) by mouth 3 (three) times daily.    Dispense:  90 tablet    Refill:  0    Follow-up:  Return in about 6 months (around 08/03/2021).  [place]

## 2021-02-02 NOTE — Assessment & Plan Note (Signed)
Based off history and physical exam, I am concerned that he has Parkinson's.  Referring to neurology.

## 2021-02-02 NOTE — Assessment & Plan Note (Signed)
Stable. Continue Pravastatin.

## 2021-02-06 ENCOUNTER — Encounter: Payer: Self-pay | Admitting: Neurology

## 2021-02-08 NOTE — Progress Notes (Signed)
Assessment/Plan:   1.  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  -We discussed the diagnosis as well as pathophysiology of the disease.  We discussed treatment options as well as prognostic indicators.  Patient education was provided.  -We discussed that it used to be thought that levodopa would increase risk of melanoma but now it is believed that Parkinsons itself likely increases risk of melanoma. he is to get regular skin checks.  -Greater than 50% of the 60 minute visit was spent in counseling answering questions and talking about what to expect now as well as in the future.  We talked about medication options as well as potential future surgical options.  We talked about safety in the home.  -We decided to add carbidopa/levodopa 25/100.  1/2 tab tid x 1 wk, then 1/2 in am & noon & 1 at night for a week, then 1/2 in am &1 at noon &night for a week, then 1 po tid.  Risks, benefits, side effects and alternative therapies were discussed.  The opportunity to ask questions was given and they were answered to the best of my ability.  The patient expressed understanding and willingness to follow the outlined treatment protocols.  Wife will let me know if hallucinations change/increase.  -I will refer the patient to [facility] for PT/ST for Parkinson's  -We discussed community resources in the area including patient support groups and community exercise programs for PD and pt education was provided to the patient.  -Met with my LCSW today.  Given information on pace  2.  Significant sialorrhea  -Told him to hold Robinul, as that can worsen hallucinations and confusion  -Patient is currently socially isolating because he is embarrassed per wife and not going to church.  Discussed Myobloc.  They are  interested, if they can afford it.  We will try to get authorization.  Discussed risk, benefits, side effects.  3.  Constipation  -discussed nature and pathophysiology and association with PD  -discussed importance of hydration.  Pt is to increase water intake  -pt is given a copy of the rancho recipe  -recommended daily colace  -recommended miralax prn   4.  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  We will need to watch this closely     Subjective:   Mark Brady was seen today in the movement disorders clinic for neurologic consultation at the request of Coral Spikes, DO.  The consultation is for the evaluation of shuffling gait, flat affect and to rule out Parkinson's disease.  Medical records made available to me are reviewed.  Wife with the patient who supplements the history.   Specific Symptoms:  Tremor: generally no Family hx of similar:  No. Voice: weaker Sleep: sleeps well  Vivid Dreams:  Yes.    Acting out dreams:  No. Wet Pillows: No. Postural symptoms:  he thinks balance is same as everyone else his age  Falls?  No. Bradykinesia symptoms: shuffling gait, slow movements, and difficulty getting out of a chair; drooling in day Loss of smell:  Yes.   X several years Loss of taste:  Yes.   Urinary Incontinence:  No., has nocturia Difficulty Swallowing:  Yes.  , with pills Trouble with ADL's:  Yes.  , wife may  or may not help (some due to confusion)  Trouble buttoning clothing: Yes.   Memory changes:  Yes.   (Quit driving a year ago; wife does finances but she always has; wife cooks) Hallucinations:  Yes.  , arises from sleep generally and rarely in the day N/V:  No. Lightheaded:  No.  Syncope: No. Diplopia: rarely Dyskinesia:  No.   Last MRI of the brain was done many years ago (2012) after having left arm numbness, headache, weakness and demonstrated extensive small vessel disease.   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  There are no films available    ALLERGIES:   Allergies  Allergen Reactions   Penicillins    Aspirin Rash    CURRENT MEDICATIONS:  Current Outpatient Medications  Medication Instructions   ACCU-CHEK SOFTCLIX LANCETS lancets USE AS DIRECTED ONCE DAILY   amLODipine (NORVASC) 10 mg, Oral, Daily   Cholecalciferol (VITAMIN D-3) 125 MCG (5000 UT) TABS Oral   dorzolamide-timolol (COSOPT) 22.3-6.8 MG/ML ophthalmic solution 1 drop, Both Eyes, Daily   fluticasone (FLONASE) 50 MCG/ACT nasal spray 2 sprays, Each Nare, Daily   glucose blood (ACCU-CHEK AVIVA PLUS) test strip USE TO CHECK BLOOD SUGAR DAILY   glycopyrrolate (ROBINUL) 1 mg, Oral, 3 times daily   latanoprost (XALATAN) 0.005 % ophthalmic solution INT 1 GTT IN OU QD IN THE EVE   losartan (COZAAR) 100 MG tablet TAKE 1 TABLET(100 MG) BY MOUTH DAILY   mupirocin ointment (BACTROBAN) 2 % 1 application, Topical, Daily PRN   pantoprazole (PROTONIX) 40 MG tablet TAKE 1 TABLET(40 MG) BY MOUTH DAILY   pravastatin (PRAVACHOL) 40 MG tablet TAKE 1 TABLET(40 MG) BY MOUTH DAILY    Objective:   VITALS:   Vitals:   02/09/21 1312  BP: (!) 111/58  Pulse: 68  SpO2: 98%  Weight: 164 lb 12.8 oz (74.8 kg)  Height: '5\' 7"'  (1.702 m)    GEN:  The patient appears stated age and is in NAD. HEENT:  Normocephalic, atraumatic.  The mucous membranes are moist. The superficial temporal arteries are without ropiness or tenderness.  Pt is visibly drooling CV:  RRR Lungs:  CTAB Neck/HEME:  There are no carotid bruits bilaterally.  Neurological examination:  Orientation: The patient is alert and oriented to person and place only. Cranial nerves: There is good facial symmetry. Extraocular muscles are intact.  Has trouble participating with formal visual field testing, but does move his eyes and is able to count the fingers.  The speech is fluent, hypophonic, mildly dysarthric.  Soft palate rises symmetrically and there is no tongue deviation. Hearing is slightly decreased to conversational tone. Sensation: Sensation is intact to light and pinprick throughout (facial, trunk, extremities). Vibration is decreased at the bilateral big toe. There is no extinction with double simultaneous stimulation. There is no sensory dermatomal level identified. Motor: Strength is 5/5 in the bilateral upper and lower extremities.   Shoulder shrug is equal and symmetric.  There is no pronator drift.   Movement examination: Tone: There is mild to moderately increased in the bilateral upper extremities Abnormal movements: none Coordination:  There is  decremation with RAM's, with any form of RAMS, including alternating supination and pronation of the forearm, hand opening and closing, finger taps, heel taps and toe taps, bilaterally, L slightly>R Gait and Station: The patient has difficulty arising out of a deep-seated chair without the use of the hands.  He pushes off to arise.  The patient's  stride length is markedly decreased with shuffling and turning en bloc.   I have reviewed and interpreted the following labs independently   Chemistry      Component Value Date/Time   NA 140 08/24/2020 1403   K 4.2 08/24/2020 1403   CL 100 08/24/2020 1403   CO2 21 08/24/2020 1403   BUN 19 08/24/2020 1403   CREATININE 0.90 08/24/2020 1403   CREATININE 1.15 09/21/2013 0719      Component Value Date/Time   CALCIUM 9.4 08/24/2020 1403   ALKPHOS 93 08/24/2020 1403   AST 10 08/24/2020 1403   ALT 11 08/24/2020 1403   BILITOT 0.3 08/24/2020 1403      Lab Results  Component Value Date   TSH 3.819 09/05/2014   Lab Results  Component Value Date   WBC 9.0 08/24/2020   HGB 13.3 08/24/2020   HCT 41.7 08/24/2020   MCV 95 08/24/2020   PLT 371 08/24/2020     Total time spent on today's visit was 60 minutes, including both face-to-face time and nonface-to-face time.  Time included that spent on  review of records (prior notes available to me/labs/imaging if pertinent), discussing treatment and goals, answering patient's questions and coordinating care.  Cc:  Coral Spikes, DO

## 2021-02-09 ENCOUNTER — Other Ambulatory Visit: Payer: Self-pay

## 2021-02-09 ENCOUNTER — Ambulatory Visit: Payer: Medicare Other | Admitting: Neurology

## 2021-02-09 ENCOUNTER — Encounter: Payer: Self-pay | Admitting: Neurology

## 2021-02-09 VITALS — BP 111/58 | HR 68 | Ht 67.0 in | Wt 164.8 lb

## 2021-02-09 DIAGNOSIS — G20 Parkinson's disease: Secondary | ICD-10-CM | POA: Diagnosis not present

## 2021-02-09 DIAGNOSIS — F0280 Dementia in other diseases classified elsewhere without behavioral disturbance: Secondary | ICD-10-CM | POA: Diagnosis not present

## 2021-02-09 DIAGNOSIS — K117 Disturbances of salivary secretion: Secondary | ICD-10-CM | POA: Diagnosis not present

## 2021-02-09 NOTE — Patient Instructions (Addendum)
Start Carbidopa Levodopa as follows: Take 1/2 tablet three times daily, at least 30 minutes before meals (approximately 7am/11am/4pm), for one week Then take 1/2 tablet in the morning, 1/2 tablet in the afternoon, 1 tablet in the evening, at least 30 minutes before meals, for one week Then take 1/2 tablet in the morning, 1 tablet in the afternoon, 1 tablet in the evening, at least 30 minutes before meals, for one week Then take 1 tablet three times daily at 7am/11am/4pm, at least 30 minutes before meals   As a reminder, carbidopa/levodopa can be taken at the same time as a carbohydrate, but we like to have you take your pill either 30 minutes before a protein source or 1 hour after as protein can interfere with carbidopa/levodopa absorption  Your referral for Physical and speech therapy has been sent in to  [facility] [place] Address: [address], [place], [place] [address] Phone: [phone] Online Resources for Power over Parkinson's Group October 2022  [place] over [employer] Group :   Power Over Parkinson's Patient Education Group will be Wednesday, October 12th-*Hybrid meting*- in person at [place] location and via [facility] at 2:00 pm.   Upcoming [employer] over [employer] Meetings:  2nd Wednesdays of the month at 2 pm:  October 12th, November 9th Contact Amy Marriott at [email or web address] if interested in participating in this online group Parkinson's Care Partners Group:    3rd Mondays, Contact Misty Paladino Atypical Parkinsonian Patient Group:   4th Wednesdays, [place] If you are interested in participating in these online groups with Misty, please contact her directly for how to join those meetings.  Her contact information is [email or web address].   [place]:  [email or web address] PD Health at Home continues:  Mindfulness Mondays, Expert Briefing  Tuesdays, Wellness Wednesdays, Take Time Thursdays, Fitness Fridays -Listings for June 2022 are on the website Upcoming Webinar:  Expert Briefing:  Let's Talk about Dementia.  Wednesday, November 2nd  at 1 pm. Upcoming Webinar:  Understanding Gene and Cell-Based Therapies in Parkinson's.  Wednesday, October 5th at 1 pm Register for expert briefings (webinars) at [email or web address]  Please check out their website to sign up for emails and see their full online offerings  [place]:  [email or web address]  Upcoming Webinar:   Deep Brain Stimulation:  Is it Right for me or my Loved One?  Thursday, October 20th at 12 noon Check out additional information on their website to see their full online offerings  [place]:  [email or web address] Upcoming Webinar:  Living With and Managing Parkinson's Disease Psychosis.  Tuesday, October 18th at 3 pm.  Live Q & A:  Parkinson's Disease Psychosis.  Friday, October 28th at 3 pm. Care Partner Monthly Meetup.  With Robin Searing Phinney.  First Tuesday of each month, 2 pm Joy Breaks:  First Wednesday of each month, 2-3 pm. There will be art, doodling, making, crafting, listening, laughing, stories, and everything in between. No art experience necessary. No supplies required. Just show up for joy!  Register on their website. Check out additional information to Live Well Today on their website  Parkinson and Movement Disorders (PMD) Alliance:  www.[email or web address] [employer]Life Online:  Online Education Events Sign up for emails, which are sent weekly to give you updates on programming and online offerings  Parkinson's Association of the [place]:  www.[email or web address] Information on online support groups, education events, and online exercises including Yoga, Parkinson's exercises and more-LOTS of  information on links to PD resources and online  events Virtual Support Group through Parkinson's Association of the [place]; next one is scheduled for Wednesday, October 5th at 2 pm.  (These are typically scheduled for the 1st Wednesday of the month at 2 pm).  Visit website for details.  Additional links for movement activities: Parkinson's DRUMMING Classes/Music Therapy with Doylene Canning:  This is a returning class and it's FREE!  2nd Mondays, continuing October 10th.  Contact *Misty Taylor-Paladino at [email or web address] or Doylene Canning at [phone] or [email or web address]  PWR! Moves Classes at [place] RESUMED!  Wednesdays 10 and 11 am.  Contact Amy Marriott, PT [email or web address] if interested Here is a link to the PWR!Moves classes on Zoom from [place] - Daily Mon-Sat at 10:00. Via Zoom, FREE and open to all.  There is also a link below via Facebook if you use that platform. [email or web address] Parkinson's Wellness Recovery (PWR! Moves)  www.[email or web address] Info on the PWR! Virtual Experience:  You will have access to our expertise through self-assessment, guided plans that start with the PD-specific fundamentals, educational content, tips, Q&A with an expert, and a growing Art therapist of PD-specific pre-recorded and live exercise classes of varying types and intensity - both physical and cognitive! If that is not enough, we offer 1:1 wellness consultations (in-person or virtual) to personalize your PWR! Research scientist (medical).  Sunrise Fridays:  As part of the PD Health @ Home program, this free video series focuses each week on one aspect of fitness designed to support people living with Parkinson's.  These weekly videos highlight the [place] recent fitness guidelines for people with Parkinson's disease.  HollywoodSale.dk Dance for PD website is offering free, live-stream classes throughout the week, as well as links to [employer] of classes:  [email or web address] Dance for Parkinson's Class:  [place].  Free offering for people with Parkinson's and care partners; virtual class.  For more information, contact [phone] or email Ruffin Frederick at [email or web address] Virtual dance and Pilates for Parkinson's classes: Click on the Community Tab> Parkinson's Movement Initiative Tab.  To register for classes and for more information, visit [email or web address] and click the "community" tab.  [employer] Parkinson's Cycling Classes  [employer]: 1pm on Fridays-Live classes at [employer] ([employer] at [email or web address] or [phone]) [employer]: Virtual Classes Mondays and Thursdays Jeanette [name] classes Tuesday, Wednesday and Thursday (contact [place] at [email or web address]  or [phone])  [place] Varied levels of classes are offered Mondays, Tuesdays and Thursdays at [employer].  To observe a class or for more information, call [phone] or email [email or web address] [employer]: Online Caregiver Education Opportunities:  www.well-springsolutions.org/caregiver-education/caregiver-support-group.  You may also contact Vickki Muff at [email or web address] or [phone].   Powerful Tools for Caregivers:  6-week program beginning Thursday, October 13th.  This six-week educational series designed to provide family caregivers with practical tools to care for themselves while caring for a loved one Unlocking Dementia through [facility], Humor, and Understanding:  5-week series beginning September 7th Well-Spring Navigator:   06-21-1997 program, a free service to help individuals and families through the journey of determining care for older adults.  The "Navigator" is a [employer], Education officer, museum, who will speak with a prospective client and/or loved ones to provide an assessment of the situation and a set of recommendations for a personalized care plan -- all free of charge, and whether [employer] offers the needed service or not. If the need is not a  service we provide, we are well-connected with reputable programs in town that we can refer you to.  www.well-[email or web address] or to speak with the Navigator, call [phone].

## 2021-02-13 ENCOUNTER — Telehealth: Payer: Self-pay | Admitting: Neurology

## 2021-02-13 ENCOUNTER — Other Ambulatory Visit: Payer: Self-pay

## 2021-02-13 ENCOUNTER — Encounter: Payer: Self-pay | Admitting: *Deleted

## 2021-02-13 DIAGNOSIS — G20 Parkinson's disease: Secondary | ICD-10-CM

## 2021-02-13 DIAGNOSIS — K117 Disturbances of salivary secretion: Secondary | ICD-10-CM

## 2021-02-13 DIAGNOSIS — F0280 Dementia in other diseases classified elsewhere without behavioral disturbance: Secondary | ICD-10-CM

## 2021-02-13 MED ORDER — CARBIDOPA-LEVODOPA 25-100 MG PO TABS: 1.0000 | ORAL_TABLET | Freq: Three times a day (TID) | ORAL | 2 refills | Status: DC

## 2021-02-13 NOTE — Progress Notes (Signed)
Submitted PA through Myobloc portal called [employer] now waiting for response of benefit verification, then will submit PA if required.   Patient Demographics - ([id number]) Patient Id :[id number] Patient Status :Intake - New ReferralTherapy :MYOBLOC

## 2021-02-13 NOTE — Telephone Encounter (Signed)
  1. Which medications need to be refilled? (please list name of each medication and dose if known) Carbidopa-levodopa  2. Which pharmacy/location (including street and city if local pharmacy) is medication to be sent to? [place]

## 2021-02-20 ENCOUNTER — Ambulatory Visit (HOSPITAL_COMMUNITY): Payer: Medicare Other | Attending: Neurology | Admitting: Speech Pathology

## 2021-02-20 ENCOUNTER — Other Ambulatory Visit: Payer: Self-pay

## 2021-02-20 DIAGNOSIS — R471 Dysarthria and anarthria: Secondary | ICD-10-CM | POA: Diagnosis present

## 2021-02-20 NOTE — Progress Notes (Signed)
Mark Brady (Key: [id number]) (CoverMyMeds)  [employer] is reviewing your PA request. Typically an electronic response will be received within 24-72 hours. To check for an update later, open this request from your dashboard.  Myobloc website did not receive all the information submitted so I restarted reques on CoverMyMeds

## 2021-02-21 ENCOUNTER — Telehealth (HOSPITAL_COMMUNITY): Payer: Self-pay | Admitting: Speech Pathology

## 2021-02-21 ENCOUNTER — Telehealth: Payer: Self-pay

## 2021-02-21 ENCOUNTER — Ambulatory Visit: Payer: Medicare Other | Admitting: Neurology

## 2021-02-21 NOTE — Telephone Encounter (Signed)
F/u   Fax completed form to [employer] [facility] at [phone]

## 2021-02-21 NOTE — Telephone Encounter (Signed)
Pt wants to be d/c for ST since they have other Health issues that have come to their attention

## 2021-02-21 NOTE — Telephone Encounter (Signed)
New message    Receive fax from [place] [phone].   The request we received from your office for prior authorization is incomplete.   Fax back  [phone]

## 2021-02-22 ENCOUNTER — Encounter (HOSPITAL_COMMUNITY): Payer: Self-pay | Admitting: Speech Pathology

## 2021-02-23 NOTE — Therapy (Signed)
[place], [place], [address] Phone: [phone]   Fax:  [phone]  Speech Language Pathology Evaluation  Patient Details  Name: Mark Brady MRN: [id number] Date of Birth: [birth year] Referring Provider (SLP): Alonza Bogus, MD   Encounter Date: 02/20/2021   End of Session - 02/20/21 1821     Visit Number 1    Number of Visits 12    Date for SLP Re-Evaluation 04/10/21    Authorization Type UHC Medicare   OOP MAX $3,600.00/ $68.84 MET  $30.00 co-pay  no co-ins  no visit limit  no auth req   SLP Start Time 1030    SLP Stop Time  1115    SLP Time Calculation (min) 45 min    Activity Tolerance Patient tolerated treatment well             Past Medical History:  Diagnosis Date   Arthritis    Diabetes mellitus without complication ([place])    diet controlled   History of elevated PSA    Hyperlipidemia    Hypertension    Stroke [facility])     Past Surgical History:  Procedure Laterality Date   CATARACT EXTRACTION W/PHACO Right 01/26/2014   Procedure: CATARACT EXTRACTION PHACO AND INTRAOCULAR LENS PLACEMENT ([place]);  Surgeon: Elta Guadeloupe T. Gershon Crane, MD;  Location: [facility];  Service: Ophthalmology;  Laterality: Right;  CDE 6.53   CATARACT EXTRACTION W/PHACO Left 02/09/2014   Procedure: CATARACT EXTRACTION PHACO AND INTRAOCULAR LENS PLACEMENT ([facility]);  Surgeon: Elta Guadeloupe T. Gershon Crane, MD;  Location: [facility];  Service: Ophthalmology;  Laterality: Left;  CDE:5.58   COLONOSCOPY     YAG LASER APPLICATION Right 0/90/3014   Procedure: YAG LASER APPLICATION;  Surgeon: Rutherford Guys, MD;  Location: [facility];  Service: Ophthalmology;  Laterality: Right;    There were no vitals filed for this visit.   Subjective Assessment - 02/20/21 1328     Subjective "I don't know that."    Patient is accompained by: Family member    Special Tests SLUMS, EAT-10, VHI    Currently in Pain? No/denies                SLP Evaluation [facility] - 02/20/21  1328       SLP Visit Information   SLP Received On 02/20/21    Referring Provider (SLP) Alonza Bogus, MD    Medical Diagnosis Parkinson's Disease      Subjective   Subjective "I don't know"      General Information   HPI [name] is an 87 year old male who was referred by Dr. Wells Guiles Tat for evaluation and treatment due to new diagnosis of Parkinson's disease. He was accompanied to the appointment by his wife, Theoren Palka. His PCP is Dr. Thersa Salt.    Behavioral/Cognition alert    Mobility Status ambulatory      Balance Screen   Has the patient fallen in the past 6 months No    Has the patient had a decrease in activity level because of a fear of falling?  No    Is the patient reluctant to leave their home because of a fear of falling?  No      Prior Functional Status   Cognitive/Linguistic Baseline Baseline deficits    Baseline deficit details cognitive deficits    Type of Home House     Lives With Spouse    Available Support Family    Education some high school  Vocation Retired      Charity fundraiser Status History of cognitive impairments - at baseline    Attention Sustained    Sustained Attention Impaired    Sustained Attention Impairment Verbal basic    Memory Impaired    Memory Impairment Storage deficit;Retrieval deficit    Awareness Impaired    Awareness Impairment Emergent impairment    Problem Solving Impaired    Problem Solving Impairment Verbal basic    Executive Function Sequencing;Organizing    Sequencing Impaired    Organizing Impaired      Auditory Comprehension   Overall Auditory Comprehension Impaired    Yes/No Questions Within Functional Limits    Commands Impaired    One Step Basic Commands 75-100% accurate    Two Step Basic Commands 50-74% accurate    Conversation Moderately complex    Interfering Components Attention;Anxiety;Working Field seismologist Within [employer]      Reading Comprehension   Reading Status Impaired    Word level 76-100% accurate    Sentence Level 76-100% accurate      Expression   Primary Mode of Expression Verbal      Verbal Expression   Overall Verbal Expression Impaired    Initiation No impairment    Automatic Speech Name;Social Response;Counting    Level of Generative/Spontaneous Verbalization Conversation    Repetition Impaired    Level of Impairment Phrase level    Naming Impairment    Responsive 51-75% accurate    Confrontation 75-100% accurate    Convergent Not tested    Divergent 25-49% accurate   6 animals in one minute   Pragmatics No impairment    Interfering Components Attention;Speech intelligibility    Non-Verbal Means of Communication Not applicable      Written Expression   Dominant Hand Right    Written Expression Not tested      Oral Motor/Sensory Function   Overall Oral Motor/Sensory Function --   mild weakness     Motor Speech   Overall Motor Speech Impaired    Respiration Impaired    Level of Impairment Phrase    Phonation Hoarse;Breathy;Low vocal intensity    Resonance Within functional limits    Articulation Impaired    Level of Impairment Phrase    Intelligibility Intelligibility reduced    Word 75-100% accurate    Phrase 50-74% accurate    Conversation 50-74% accurate    Motor Planning Witnin functional limits    Motor Speech Errors Unaware    Effective Techniques Increased vocal intensity;Slow rate    Phonation Impaired    Volume Decibel Level   64 dB conversation   Pitch Appropriate      Standardized Assessments   Standardized Assessments  --   SLUMS 5/30           The Voice Handicap Index-10 (VHI-10) was administered. This survey is a series of questions targeting the patient's perception of his/her own voice using a scale of 0-4 (0=Never, 4=Always). Score greater than 11 is abnormal. My voice makes it  difficult for people to hear me. 3  People have difficulty understanding me in a noisy room. 3  My voice difficulties restrict personal and social life. 2 I feel left out of conversations because of my voice. 2  My voice problem causes me to lose income. 0  I feel as though I have to strain to produce voice. 0  The clarity of my voice is unpredictable. 2  My voice problem upsets me. 2  My voice makes me feel handicapped. 2  People ask, "What's wrong with your voice?" 0  TOTAL SCORE: 16 '[X]'  Abnormal (raw score >11) '[]'  Normal 16/40   EATING ASSESSMENT TOOL (EAT-10)   The patient was asked to rate to what extent the following statements are problematic on a scale of 0-4. 0 = No problem; 4 = Severe problem. A total score of 3 or higher is considered abnormal. My swallowing problem has caused me to lose weight. 0  My swallowing problem interferes with my ability to go out for meals. 0  Swallowing liquids takes extra effort. 0 Swallowing solids takes extra effort. 0  Swallowing pills takes extra effort. 0 Swallowing is painful. 0  The pleasure of eating is affected by my swallowing. 0  When I swallow food sticks in my throat. 0  I cough when I eat. 0 Swallowing is stressful. 0  TOTAL SCORE: 0 '[]'  Abnormal (raw score >3) '[X]'  Normal     Respiration Maximum loudness (sustained phonation): 93 dB SPL  Loudness in connected speech (reading a paragraph): average of 58 dB SPL; decreased loudness Loudness in connected speech (spontaneous speech): average of 64 dB SPL; decreased loudness Maximum phonation duration: 3 seconds; WFL [Minimum: young male: 51 s, young fem: 71 s, elderly male: 41 s, elderly fem: 10 s Nada Boozer, [place], [place]  Connected speech: reduced loudness ; speaks on residual air, reduced breath support  Resonance Conversational speech: [facility]    SLP Short Term Goals - 02/20/21 1827       SLP SHORT TERM GOAL #1   Title Pt will coordinate vocal and articulatory  subsystems in hierarchical speech tasks by producing sounds with intention with mi/mod assistance.    Baseline mod/max    Time 6    Period Weeks    Status New    Target Date 04/10/21      SLP SHORT TERM GOAL #2   Title Read phrases, sentences, and paragraphs with intention, yielding improved vocal quality, loudness, articulatory precision, and endurance while maintaining a minimum of 85 dB with min/mod assistance.    Baseline mod/max    Time 6    Period Weeks    Status New    Target Date 04/10/21      SLP SHORT TERM GOAL #3   Title Generalize intentional speech to cognitive-linguistic exercises and conversational speech with improved vocal quality, loudness, articulatory precision, and endurance while maintaining a minimum of 85 dB with min/mod assistance.    Baseline max    Time 6    Period Weeks    Status New    Target Date 04/10/21                Plan - 02/20/21 1825     Clinical Impression Statement Pt presents with moderate hypokinetic dysarthria characterized by vocal hoarseness and breathiness, rapid rate of speech, and reduced breath support for speech. Speech is judged to be 75% intelligible in conversation with an unknown listener in a quiet environment. Pt exhibited the following: conversation 64 dB, paragraph reading 58 dB, and sustained phonation 93 dB for 3 seconds. Pt stimulable for "speaking with intent" and increased sustained phonation to 94 dB and oral reading to 79 dB. See above for specifics of Pt questionnaires (VHI 16, EAT 0). In addition, he presents with  cognitive linguistid deficits characterized by impaired attention, memory, planning, and reasoning skills. Pt  scored a 6/30 on the SLUMS. Pt will benefit from skilled SLP in order to address the above impairments, maximize independence, and decrease burden of care.    Speech Therapy Frequency 2x / week    Duration --   6 weeks   Treatment/Interventions Cueing hierarchy;SLP instruction and  feedback;Compensatory techniques;Compensatory strategies;Patient/family education    Potential to Achieve Goals Good    SLP Home Exercise Plan Pt will completed HEP as assigned to facilitate carryover of treatment strategies and techniques in home environment with use of written cues as needed.    Consulted and Agree with Plan of Care Patient;Family member/caregiver    Family Member Consulted spouse             Patient will benefit from skilled therapeutic intervention in order to improve the following deficits and impairments:   Dysarthria and anarthria    Problem List Patient Active Problem List   Diagnosis Date Noted   History of TIA (transient ischemic attack) 02/02/2021   Parkinsonian features 02/02/2021   Skin lesion 02/02/2021   Sialorrhea 05/14/2020   Gastroesophageal reflux disease without esophagitis 05/13/2020   Glaucoma 11/23/2019   Diabetes ([place]) 10/19/2012   Essential hypertension, benign 09/28/2012   Hyperlipidemia 09/28/2012    SPEECH THERAPY DISCHARGE SUMMARY  Visits from Start of Care: 1  Current functional level related to goals / functional outcomes: No goals targeted or met due to failure to return to clinic post evaluation   Remaining deficits: As above   Education / Equipment: Initiated plan for [employer] Therapy and workbook was likely already sent to the Pt's home, but they called to cancel appointments due to "dealing with other health concerns".   Patient agrees to discharge. Patient goals were not met. Patient is being discharged due to not returning since the last visit.Lujean Rave you,  Genene Churn, [place]  [facility] 02/20/2021, 6:31 PM  [place] [address] [place], [place], [address] Phone: [phone]   Fax:  [phone]  Name: ANDRU GENTER MRN: [id number] Date of Birth: [birth year]

## 2021-02-27 ENCOUNTER — Telehealth (HOSPITAL_COMMUNITY): Payer: Self-pay | Admitting: Speech Pathology

## 2021-02-27 NOTE — Telephone Encounter (Signed)
Pt was d/c due to other health issues and removed from wait list today 02/27/2021

## 2021-02-27 NOTE — Telephone Encounter (Signed)
Pt's wife called to state they will be d/c from [place] and on hold for PT since he has other pressing health issues that have come up at this time.

## 2021-02-28 ENCOUNTER — Ambulatory Visit (HOSPITAL_COMMUNITY): Payer: Medicare Other | Admitting: Physical Therapy

## 2021-03-01 ENCOUNTER — Other Ambulatory Visit: Payer: Self-pay | Admitting: Family Medicine

## 2021-03-02 ENCOUNTER — Ambulatory Visit (HOSPITAL_COMMUNITY): Payer: Medicare Other | Admitting: Speech Pathology

## 2021-03-07 ENCOUNTER — Ambulatory Visit (HOSPITAL_COMMUNITY): Payer: Medicare Other | Admitting: Speech Pathology

## 2021-03-14 ENCOUNTER — Ambulatory Visit (HOSPITAL_COMMUNITY): Payer: Medicare Other | Admitting: Speech Pathology

## 2021-03-16 ENCOUNTER — Ambulatory Visit (HOSPITAL_COMMUNITY): Payer: Medicare Other | Admitting: Speech Pathology

## 2021-03-20 ENCOUNTER — Ambulatory Visit (HOSPITAL_COMMUNITY): Payer: Medicare Other | Admitting: Speech Pathology

## 2021-03-22 ENCOUNTER — Ambulatory Visit (HOSPITAL_COMMUNITY): Payer: Medicare Other | Admitting: Speech Pathology

## 2021-03-27 ENCOUNTER — Ambulatory Visit (HOSPITAL_COMMUNITY): Payer: Medicare Other | Admitting: Speech Pathology

## 2021-03-29 ENCOUNTER — Ambulatory Visit (HOSPITAL_COMMUNITY): Payer: Medicare Other | Admitting: Speech Pathology

## 2021-04-03 ENCOUNTER — Ambulatory Visit (HOSPITAL_COMMUNITY): Payer: Medicare Other | Admitting: Speech Pathology

## 2021-04-05 ENCOUNTER — Ambulatory Visit (HOSPITAL_COMMUNITY): Payer: Medicare Other | Admitting: Speech Pathology

## 2021-04-06 ENCOUNTER — Encounter (HOSPITAL_COMMUNITY): Payer: Self-pay | Admitting: Physical Therapy

## 2021-04-06 ENCOUNTER — Ambulatory Visit (HOSPITAL_COMMUNITY): Payer: Medicare Other | Attending: Neurology | Admitting: Physical Therapy

## 2021-04-06 ENCOUNTER — Other Ambulatory Visit: Payer: Self-pay

## 2021-04-06 DIAGNOSIS — G20 Parkinson's disease: Secondary | ICD-10-CM | POA: Insufficient documentation

## 2021-04-06 DIAGNOSIS — R2689 Other abnormalities of gait and mobility: Secondary | ICD-10-CM | POA: Insufficient documentation

## 2021-04-06 DIAGNOSIS — R262 Difficulty in walking, not elsewhere classified: Secondary | ICD-10-CM | POA: Insufficient documentation

## 2021-04-06 NOTE — Patient Instructions (Signed)
Access Code: [email or web address] URL: [email or web address] Date: 04/06/2021 Prepared by: Josue Hector  Exercises Seated Long Arc Quad - 3 x daily - 7 x weekly - 2 sets - 10 reps Seated March - 3 x daily - 7 x weekly - 2 sets - 10 reps

## 2021-04-06 NOTE — Therapy (Signed)
[place], [place], [address] Phone: [phone]   Fax:  [phone]  Physical Therapy Evaluation  Patient Details  Name: Mark Brady MRN: [id number] Date of Birth: [birth year] Referring Provider (PT): Wells Guiles Tat DO   Encounter Date: 04/06/2021   PT End of Session - 04/06/21 1113     Visit Number 1    Number of Visits 16    Date for PT Re-Evaluation 06/01/21    Authorization Type UHC MEd[employer]    Progress Note Due on Visit 10    PT Start Time 1033    PT Stop Time 1116    PT Time Calculation (min) 43 min    Equipment Utilized During Treatment Gait belt    Activity Tolerance Patient tolerated treatment well    Behavior During Therapy WFL for tasks assessed/performed             Past Medical History:  Diagnosis Date   Arthritis    Diabetes mellitus without complication ([place])    diet controlled   History of elevated PSA    Hyperlipidemia    Hypertension    Stroke [facility])     Past Surgical History:  Procedure Laterality Date   CATARACT EXTRACTION W/PHACO Right 01/26/2014   Procedure: CATARACT EXTRACTION PHACO AND INTRAOCULAR LENS PLACEMENT ([place]);  Surgeon: Elta Guadeloupe T. Gershon Crane, MD;  Location: [facility];  Service: Ophthalmology;  Laterality: Right;  CDE 6.53   CATARACT EXTRACTION W/PHACO Left 02/09/2014   Procedure: CATARACT EXTRACTION PHACO AND INTRAOCULAR LENS PLACEMENT ([facility]);  Surgeon: Elta Guadeloupe T. Gershon Crane, MD;  Location: [facility];  Service: Ophthalmology;  Laterality: Left;  CDE:5.58   COLONOSCOPY     YAG LASER APPLICATION Right 2/48/2500   Procedure: YAG LASER APPLICATION;  Surgeon: Rutherford Guys, MD;  Location: [facility];  Service: Ophthalmology;  Laterality: Right;    There were no vitals filed for this visit.    Subjective Assessment - 04/06/21 1038     Subjective Patient presents to therapy with Parkinsons. He is with his wife today. They would like to work on improving transfers, gait and balance.     Patient is accompained by: Family member   wife   Pertinent History Parkinsons    Limitations Standing;Walking;House hold activities    Patient Stated Goals Improve gait, balance and transfers    Currently in Pain? No/denies                [facility] PT Assessment - 04/06/21 0001       Assessment   Medical Diagnosis Parkinsons    Referring Provider (PT) Wells Guiles Tat DO    Prior Therapy No      Precautions   Precautions Fall      Restrictions   Weight Bearing Restrictions No      Balance Screen   Has the patient fallen in the past 6 months Yes    How many times? 2    Has the patient had a decrease in activity level because of a fear of falling?  No    Is the patient reluctant to leave their home because of a fear of falling?  No      Home Environment   Living Environment Private residence    Living Arrangements Spouse/significant other      Cognition   Overall Cognitive Status History of cognitive impairments - at baseline      Observation/Other Assessments   Focus on Therapeutic Outcomes (FOTO)  57% function  ROM / Strength   AROM / PROM / Strength AROM;Strength      Strength   Strength Assessment Site Hip;Knee;Ankle    Right/Left Hip Right;Left    Right Hip Flexion 4+/5    Left Hip Flexion 5/5    Right/Left Knee Right;Left    Right Knee Extension 4+/5    Left Knee Extension 5/5    Right/Left Ankle Right;Left    Right Ankle Dorsiflexion 4+/5    Left Ankle Dorsiflexion 5/5      Transfers   Five time sit to stand comments  5 reps using UEs in 30 second span  (28 seconds)      Ambulation/Gait   Ambulation/Gait Yes    Ambulation/Gait Assistance 5: Supervision    Assistive device None    Gait Pattern Decreased arm swing - right;Decreased arm swing - left;Decreased step length - right;Decreased step length - left;Decreased stride length;Decreased hip/knee flexion - right;Decreased hip/knee flexion - left;Decreased dorsiflexion - right;Decreased dorsiflexion -  left;Trunk flexed    Ambulation Surface Level;Indoor    Gait velocity Decreased      Standardized Balance Assessment   Standardized Balance Assessment Dynamic Gait Index      Dynamic Gait Index   Level Surface Moderate Impairment    Change in Gait Speed Moderate Impairment    Gait with Horizontal Head Turns Moderate Impairment    Gait with Vertical Head Turns Moderate Impairment    Gait and Pivot Turn Mild Impairment    Step Over Obstacle Moderate Impairment    Step Around Obstacles Mild Impairment    Steps Moderate Impairment    Total Score 10      Functional Gait  Assessment   Gait assessed  Yes                        Objective measurements completed on examination: See above findings.       [facility] Adult PT Treatment/Exercise - 04/06/21 0001       Exercises   Exercises Knee/Hip      Knee/Hip Exercises: Seated   Long Arc Quad Both;10 reps    Marching Both;10 reps                     PT Education - 04/06/21 1039     Education Details on evaluation findings, POC and HEP. ALso on use of AD for balance and safety given patient presentation and DGI score    Person(s) Educated Patient;Spouse    Methods Explanation;Handout    Comprehension Verbalized understanding              PT Short Term Goals - 04/06/21 1608       PT SHORT TERM GOAL #1   Title Patient will be independent with initial HEP and self-management strategies to improve functional outcomes    Time 4    Period Weeks    Status New    Target Date 05/04/21      PT SHORT TERM GOAL #2   Title Patient will improve DGI by at least 3 points to demo improved balance and reduced risk for falls.    Time 4    Period Weeks    Status New    Target Date 05/04/21               PT Long Term Goals - 04/06/21 1621       PT LONG TERM GOAL #1   Title Patient will improve FOTO  score to predicted vlaue to indicate improvement in functional outcomes    Time 8    Period Weeks     Status New    Target Date 06/01/21      PT LONG TERM GOAL #2   Title Patient will be able to perform stand x 5 in < 20 seconds to demonstrate improvement in functional mobility and reduced risk for falls.    Time 8    Period Weeks    Status New    Target Date 06/01/21      PT LONG TERM GOAL #3   Title Patient will improve DGI to at least 16/24 points to demo improved balance and reduced risk for falls.    Time 8    Period Weeks    Status New    Target Date 06/01/21                    Plan - 04/06/21 1114     Clinical Impression Statement Patient is an 87 y.o. male who presents to physical therapy with complaint of balance and gait disturbance secondary to Parkinsons. Patient demonstrates decreased strength, balance deficits and gait abnormalities which are negatively impacting patient ability to perform ADLs and functional mobility tasks. Patient will benefit from skilled physical therapy services to address these deficits to improve level of function with ADLs, functional mobility tasks, and reduce risk for falls.    Personal Factors and Comorbidities Comorbidity 3+    Examination-Activity Limitations Locomotion Level;Transfers;Stand;Stairs    Examination-Participation Restrictions Yard Work;Community Activity;Cleaning    Stability/Clinical Decision Making Evolving/Moderate complexity    Clinical Decision Making Moderate    Rehab Potential Fair    PT Frequency 2x / week    PT Duration 8 weeks    PT Treatment/Interventions ADLs/Self Care Home Management;Biofeedback;Gait training;Cryotherapy;Stair training;DME Instruction;Patient/family education;Scar mobilization;Compression bandaging;Neuromuscular re-education;Ultrasound;Parrafin;Fluidtherapy;Contrast Bath;Visual/perceptual remediation/compensation;Spinal Manipulations;Passive range of motion;Dry needling;Joint Manipulations;Energy conservation;Orthotic Fit/Training;Splinting;Taping;Functional mobility training;Electrical  Stimulation;Iontophoresis 4mg /ml Dexamethasone;Therapeutic activities;Vasopneumatic Device;Manual techniques;Therapeutic exercise;Moist Heat;Traction;Balance training;Manual lymph drainage;Vestibular;Canalith Repostioning    PT Next Visit Plan Review HEP. Begin functional Le strength, gait and static balance. Progress to more challneging dynamic balance tasks. Work on gait with AD    PT Home Exercise Plan Eval: seated march, LAQs    Consulted and Agree with Plan of Care Family member/caregiver    Family Member Consulted wife             Patient will benefit from skilled therapeutic intervention in order to improve the following deficits and impairments:  Abnormal gait, Hypomobility, Decreased strength, Pain, Decreased balance, Difficulty walking, Improper body mechanics, Decreased mobility, Decreased activity tolerance, Decreased coordination  Visit Diagnosis: Difficulty in walking, not elsewhere classified  Other abnormalities of gait and mobility     Problem List Patient Active Problem List   Diagnosis Date Noted   History of TIA (transient ischemic attack) 02/02/2021   Parkinsonian features 02/02/2021   Skin lesion 02/02/2021   Sialorrhea 05/14/2020   Gastroesophageal reflux disease without esophagitis 05/13/2020   Glaucoma 11/23/2019   Diabetes ([place]) 10/19/2012   Essential hypertension, benign 09/28/2012   Hyperlipidemia 09/28/2012   4:26 PM, 04/06/21 Josue Hector PT DPT  Physical Therapist with [facility]  [phone] [place] [address] [place], [place], [address] Phone: [phone]   Fax:  [phone]  Name: Mark Brady MRN: [id number] Date of Birth: 1930/02/12

## 2021-04-14 ENCOUNTER — Telehealth: Payer: Self-pay | Admitting: Family Medicine

## 2021-04-14 ENCOUNTER — Other Ambulatory Visit: Payer: Self-pay | Admitting: Nurse Practitioner

## 2021-04-14 MED ORDER — NIRMATRELVIR/RITONAVIR (PAXLOVID)TABLET: 3.0000 | ORAL_TABLET | Freq: Two times a day (BID) | ORAL | 0 refills | Status: AC

## 2021-04-14 NOTE — Progress Notes (Signed)
See phone call with vitals. Spoke with his wife. He is drinking fluids well. No signs of distress. Began less than 24 hours ago. Tested positive.  Will send in Paxlovid (last GFR is 82). Start ASAP. Hold on Pravastatin now and for a week after taking Paxlovid. Discussed warning signs. Agrees to go to ED if worse.

## 2021-04-14 NOTE — Telephone Encounter (Signed)
Wife stated he has no complaints of trouble breathing and his breathing seams fine- no fever- little runny nose and deep cough started yesterday- tested positive today  O 2 sat 93 and pulse 74    [facility] on scales

## 2021-04-14 NOTE — Telephone Encounter (Signed)
Pearson Forster C, NP    Any chest pain or SOB? Can he check his oxygen?

## 2021-04-14 NOTE — Telephone Encounter (Signed)
Paxlovid prescribed. See note in chart.

## 2021-04-14 NOTE — Telephone Encounter (Signed)
Patient tested positive for Covid today and he only has a cough right now,please advisd [facility]-scales

## 2021-04-18 ENCOUNTER — Ambulatory Visit (HOSPITAL_COMMUNITY): Payer: Medicare Other | Admitting: Physical Therapy

## 2021-04-22 ENCOUNTER — Other Ambulatory Visit: Payer: Self-pay | Admitting: Neurology

## 2021-04-22 DIAGNOSIS — K117 Disturbances of salivary secretion: Secondary | ICD-10-CM

## 2021-04-22 DIAGNOSIS — G20 Parkinson's disease: Secondary | ICD-10-CM

## 2021-04-22 DIAGNOSIS — F0280 Dementia in other diseases classified elsewhere without behavioral disturbance: Secondary | ICD-10-CM

## 2021-04-24 ENCOUNTER — Other Ambulatory Visit: Payer: Self-pay

## 2021-04-25 ENCOUNTER — Ambulatory Visit (HOSPITAL_COMMUNITY): Payer: Medicare Other | Attending: Neurology | Admitting: Physical Therapy

## 2021-04-25 ENCOUNTER — Other Ambulatory Visit: Payer: Self-pay

## 2021-04-25 DIAGNOSIS — R262 Difficulty in walking, not elsewhere classified: Secondary | ICD-10-CM | POA: Diagnosis not present

## 2021-04-25 DIAGNOSIS — R2689 Other abnormalities of gait and mobility: Secondary | ICD-10-CM | POA: Diagnosis present

## 2021-04-25 NOTE — Patient Instructions (Signed)
Access Code: [email or web address] URL: [email or web address] Date: 04/25/2021 Prepared by: Josue Hector  Exercises Heel Raises with Counter Support - 2 x daily - 7 x weekly - 2 sets - 10 reps Heel Toe Raises with Counter Support - 2 x daily - 7 x weekly - 2 sets - 10 reps Sit to Stand with Counter Support - 2 x daily - 7 x weekly - 1 sets - 10 reps Standing Tandem Balance with Counter Support - 2 x daily - 7 x weekly - 1 sets - 3 reps - 20 second hold

## 2021-04-25 NOTE — Therapy (Signed)
[place], [place], [address] Phone: [phone]   Fax:  [phone]  Physical Therapy Treatment  Patient Details  Name: Mark Brady MRN: [id number] Date of Birth: 04-16-1930 Referring Provider (PT): Wells Guiles Tat DO   Encounter Date: 04/25/2021   PT End of Session - 04/25/21 1435     Visit Number 2    Number of Visits 16    Date for PT Re-Evaluation 06/01/21    Authorization Type [employer]    Progress Note Due on Visit 10    PT Start Time 1432    PT Stop Time 1512    PT Time Calculation (min) 40 min    Equipment Utilized During Treatment Gait belt    Activity Tolerance Patient tolerated treatment well    Behavior During Therapy [facility] for tasks assessed/performed             Past Medical History:  Diagnosis Date   Arthritis    Diabetes mellitus without complication ([place])    diet controlled   History of elevated PSA    Hyperlipidemia    Hypertension    Stroke [facility])     Past Surgical History:  Procedure Laterality Date   CATARACT EXTRACTION W/PHACO Right 01/26/2014   Procedure: CATARACT EXTRACTION PHACO AND INTRAOCULAR LENS PLACEMENT ([place]);  Surgeon: Elta Guadeloupe T. Gershon Crane, MD;  Location: [facility];  Service: Ophthalmology;  Laterality: Right;  CDE 6.53   CATARACT EXTRACTION W/PHACO Left 02/09/2014   Procedure: CATARACT EXTRACTION PHACO AND INTRAOCULAR LENS PLACEMENT ([facility]);  Surgeon: Elta Guadeloupe T. Gershon Crane, MD;  Location: [facility];  Service: Ophthalmology;  Laterality: Left;  CDE:5.58   COLONOSCOPY     YAG LASER APPLICATION Right 6/76/7209   Procedure: YAG LASER APPLICATION;  Surgeon: Rutherford Guys, MD;  Location: [facility];  Service: Ophthalmology;  Laterality: Right;    There were no vitals filed for this visit.   Subjective Assessment - 04/25/21 1439     Subjective Patient presents with his wife. They were sick the last 2 weeks. He has not been complaint with HEP to this point. Otherwise doing well, no new  complaints.    Patient is accompained by: Family member   wife   Pertinent History Parkinsons    Limitations Standing;Walking;House hold activities    Patient Stated Goals Improve gait, balance and transfers    Currently in Pain? No/denies                               [facility] Adult PT Treatment/Exercise - 04/25/21 0001       Knee/Hip Exercises: Standing   Heel Raises Both;2 sets;10 reps    Heel Raises Limitations TR 2 x 10    Other Standing Knee Exercises standing march 2 x 10, semi tandem stance 3 x 20"      Knee/Hip Exercises: Seated   Long Arc Quad Both;20 reps    Marching 20 reps    Sit to [employer] 10 reps;with UE support   cues for even weight shift                      PT Short Term Goals - 04/06/21 1608       PT SHORT TERM GOAL #1   Title Patient will be independent with initial HEP and self-management strategies to improve functional outcomes    Time 4    Period Weeks  Status New    Target Date 05/04/21      PT SHORT TERM GOAL #2   Title Patient will improve DGI by at least 3 points to demo improved balance and reduced risk for falls.    Time 4    Period Weeks    Status New    Target Date 05/04/21               PT Long Term Goals - 04/06/21 1621       PT LONG TERM GOAL #1   Title Patient will improve FOTO score to predicted vlaue to indicate improvement in functional outcomes    Time 8    Period Weeks    Status New    Target Date 06/01/21      PT LONG TERM GOAL #2   Title Patient will be able to perform stand x 5 in < 20 seconds to demonstrate improvement in functional mobility and reduced risk for falls.    Time 8    Period Weeks    Status New    Target Date 06/01/21      PT LONG TERM GOAL #3   Title Patient will improve DGI to at least 16/24 points to demo improved balance and reduced risk for falls.    Time 8    Period Weeks    Status New    Target Date 06/01/21                   Plan -  04/25/21 1507     Clinical Impression Statement Initiated ther ex. Reviewed therapy goals and HEP. Patient cued on sequence and cadence of seated marching. Progressed LE strengthening and introduced static balance activity. Patient educated on proper form and function of all added activity. Patient does require frequent cues for upright posturing during standing activity. Otherwise tolerated session well today. Issued HEP handout. Patient will continue to benefit from skilled therapy services to reduce deficits and improve functional ability.    Personal Factors and Comorbidities Comorbidity 3+    Examination-Activity Limitations Locomotion Level;Transfers;Stand;Stairs    Examination-Participation Restrictions Yard Work;Community Activity;Cleaning    Stability/Clinical Decision Making Evolving/Moderate complexity    Rehab Potential Fair    PT Frequency 2x / week    PT Duration 8 weeks    PT Treatment/Interventions ADLs/Self Care Home Management;Biofeedback;Gait training;Cryotherapy;Stair training;DME Instruction;Patient/family education;Scar mobilization;Compression bandaging;Neuromuscular re-education;Ultrasound;Parrafin;Fluidtherapy;Contrast Bath;Visual/perceptual remediation/compensation;Spinal Manipulations;Passive range of motion;Dry needling;Joint Manipulations;Energy conservation;Orthotic Fit/Training;Splinting;Taping;Functional mobility training;Electrical Stimulation;Iontophoresis 4mg /ml Dexamethasone;Therapeutic activities;Vasopneumatic Device;Manual techniques;Therapeutic exercise;Moist Heat;Traction;Balance training;Manual lymph drainage;Vestibular;Canalith Repostioning    PT Next Visit Plan Progress functional LE strength, gait and static balance. Progress to more challenging dynamic balance tasks. Work on gait with AD    PT Home Exercise Plan Eval: seated march, LAQs 1/10 heel raise/ toe raise, sit/stand, tandem stance with support    Consulted and Agree with Plan of Care Family  member/caregiver    Family Member Consulted wife             Patient will benefit from skilled therapeutic intervention in order to improve the following deficits and impairments:  Abnormal gait, Hypomobility, Decreased strength, Pain, Decreased balance, Difficulty walking, Improper body mechanics, Decreased mobility, Decreased activity tolerance, Decreased coordination  Visit Diagnosis: Difficulty in walking, not elsewhere classified  Other abnormalities of gait and mobility     Problem List Patient Active Problem List   Diagnosis Date Noted   History of TIA (transient ischemic attack) 02/02/2021   Parkinsonian features 02/02/2021   Skin lesion  02/02/2021   Sialorrhea 05/14/2020   Gastroesophageal reflux disease without esophagitis 05/13/2020   Glaucoma 11/23/2019   Diabetes ([place]) 10/19/2012   Essential hypertension, benign 09/28/2012   Hyperlipidemia 09/28/2012   3:16 PM, 04/25/21 Josue Hector PT DPT  Physical Therapist with [facility]  [phone] [place], [place], [address] Phone: [phone]   Fax:  [phone]  Name: Mark Brady MRN: [id number] Date of Birth: May 26, 1929

## 2021-05-02 ENCOUNTER — Other Ambulatory Visit: Payer: Self-pay

## 2021-05-02 ENCOUNTER — Ambulatory Visit (HOSPITAL_COMMUNITY): Payer: Medicare Other

## 2021-05-02 ENCOUNTER — Encounter (HOSPITAL_COMMUNITY): Payer: Self-pay

## 2021-05-02 DIAGNOSIS — R262 Difficulty in walking, not elsewhere classified: Secondary | ICD-10-CM

## 2021-05-02 DIAGNOSIS — R2689 Other abnormalities of gait and mobility: Secondary | ICD-10-CM

## 2021-05-02 NOTE — Therapy (Signed)
[place], [place], [address] Phone: [phone]   Fax:  [phone]  Physical Therapy Treatment  Patient Details  Name: Mark Brady MRN: [id number] Date of Birth: 1929-06-08 Referring Provider (PT): Wells Guiles Tat DO   Encounter Date: 05/02/2021   PT End of Session - 05/02/21 1446     Visit Number 3    Number of Visits 16    Date for PT Re-Evaluation 06/01/21    Authorization Type [employer]    Progress Note Due on Visit 10    PT Start Time 1403    PT Stop Time 1444    PT Time Calculation (min) 41 min    Equipment Utilized During Treatment Gait belt    Activity Tolerance Patient tolerated treatment well    Behavior During Therapy [facility] for tasks assessed/performed             Past Medical History:  Diagnosis Date   Arthritis    Diabetes mellitus without complication ([place])    diet controlled   History of elevated PSA    Hyperlipidemia    Hypertension    Stroke [facility])     Past Surgical History:  Procedure Laterality Date   CATARACT EXTRACTION W/PHACO Right 01/26/2014   Procedure: CATARACT EXTRACTION PHACO AND INTRAOCULAR LENS PLACEMENT ([place]);  Surgeon: Elta Guadeloupe T. Gershon Crane, MD;  Location: [facility];  Service: Ophthalmology;  Laterality: Right;  CDE 6.53   CATARACT EXTRACTION W/PHACO Left 02/09/2014   Procedure: CATARACT EXTRACTION PHACO AND INTRAOCULAR LENS PLACEMENT ([facility]);  Surgeon: Elta Guadeloupe T. Gershon Crane, MD;  Location: [facility];  Service: Ophthalmology;  Laterality: Left;  CDE:5.58   COLONOSCOPY     YAG LASER APPLICATION Right 6/96/2952   Procedure: YAG LASER APPLICATION;  Surgeon: Rutherford Guys, MD;  Location: [facility];  Service: Ophthalmology;  Laterality: Right;    There were no vitals filed for this visit.   Subjective Assessment - 05/02/21 1405     Subjective Pt stated he is feeling good, no reports of pain or recent fall.  Has began the HEP at home yesterday.  Reports he has a walker at home but  doesn't use.                [facility] PT Assessment - 05/02/21 0001       Assessment   Medical Diagnosis Parkinsons    Referring Provider (PT) Alonza Bogus DO    Next MD Visit 05/15/21    Prior Therapy no      Precautions   Precautions Fall                           [facility] Adult PT Treatment/Exercise - 05/02/21 0001       Knee/Hip Exercises: Aerobic   Nustep UE/LE coordination x 82min L2 SPM average 80      Knee/Hip Exercises: Standing   Knee Flexion Both;10 reps    Knee Flexion Limitations alternating UE/LE marching- difficult    Gait Training 198ft cueing for foot clearance and increased stride length; 122ft with RW improved mechanics    Other Standing Knee Exercises toe tapping 6in step height    Other Standing Knee Exercises sidestep 3RT front of mat      Knee/Hip Exercises: Seated   Marching 10 reps    Marching Limitations UE/ LE marching alternating 3" holds    Sit to Sand 10 reps;without UE support   with UE flexion overhead  [facility]) - 05/02/21 1417     [facility]! Sit to Stand 10x with UE overhead reach                    PT Short Term Goals - 04/06/21 1608       PT SHORT TERM GOAL #1   Title Patient will be independent with initial HEP and self-management strategies to improve functional outcomes    Time 4    Period Weeks    Status New    Target Date 05/04/21      PT SHORT TERM GOAL #2   Title Patient will improve DGI by at least 3 points to demo improved balance and reduced risk for falls.    Time 4    Period Weeks    Status New    Target Date 05/04/21               PT Long Term Goals - 04/06/21 1621       PT LONG TERM GOAL #1   Title Patient will improve FOTO score to predicted vlaue to indicate improvement in functional outcomes    Time 8    Period Weeks    Status New    Target Date 06/01/21      PT LONG TERM GOAL #2   Title Patient will be able to perform stand x 5 in < 20 seconds to  demonstrate improvement in functional mobility and reduced risk for falls.    Time 8    Period Weeks    Status New    Target Date 06/01/21      PT LONG TERM GOAL #3   Title Patient will improve DGI to at least 16/24 points to demo improved balance and reduced risk for falls.    Time 8    Period Weeks    Status New    Target Date 06/01/21                   Plan - 05/02/21 1446     Clinical Impression Statement Pt arrived ambulating with short shuffled gait mechanics.  Educated on benefits of ambulating with RW for safety with improved gait mechanics following cueing for foot clearance and to increase stride length.  Session focus with LE strengthening, coordation and balance.  Pt with increased difficulty pairing UE/LE coordination, increased verbal and tactile cueing required.  Added nustep to improve UE/LE coordination/sequence and seated/standing exercises with min A.  Pt required intermittent rest breaks per fatigue, no reoprts of pain through session.    Personal Factors and Comorbidities Comorbidity 3+    Examination-Activity Limitations Locomotion Level;Transfers;Stand;Stairs    Examination-Participation Restrictions Yard Work;Community Activity;Cleaning    Stability/Clinical Decision Making Evolving/Moderate complexity    Clinical Decision Making Moderate    Rehab Potential Fair    PT Frequency 2x / week    PT Duration 8 weeks    PT Treatment/Interventions ADLs/Self Care Home Management;Biofeedback;Gait training;Cryotherapy;Stair training;DME Instruction;Patient/family education;Scar mobilization;Compression bandaging;Neuromuscular re-education;Ultrasound;Parrafin;Fluidtherapy;Contrast Bath;Visual/perceptual remediation/compensation;Spinal Manipulations;Passive range of motion;Dry needling;Joint Manipulations;Energy conservation;Orthotic Fit/Training;Splinting;Taping;Functional mobility training;Electrical Stimulation;Iontophoresis 4mg /ml Dexamethasone;Therapeutic  activities;Vasopneumatic Device;Manual techniques;Therapeutic exercise;Moist Heat;Traction;Balance training;Manual lymph drainage;Vestibular;Canalith Repostioning    PT Next Visit Plan Progress functional LE strength, gait and static balance. Progress to more challenging dynamic balance tasks. Work on gait with AD    PT Home Exercise Plan Eval: seated march, LAQs 1/10 heel raise/ toe raise, sit/stand, tandem stance with support    Consulted and Agree with Plan of Care Family member/caregiver  Family Member Consulted wife Vaughan Basta             Patient will benefit from skilled therapeutic intervention in order to improve the following deficits and impairments:  Abnormal gait, Hypomobility, Decreased strength, Pain, Decreased balance, Difficulty walking, Improper body mechanics, Decreased mobility, Decreased activity tolerance, Decreased coordination  Visit Diagnosis: Difficulty in walking, not elsewhere classified  Other abnormalities of gait and mobility     Problem List Patient Active Problem List   Diagnosis Date Noted   History of TIA (transient ischemic attack) 02/02/2021   Parkinsonian features 02/02/2021   Skin lesion 02/02/2021   Sialorrhea 05/14/2020   Gastroesophageal reflux disease without esophagitis 05/13/2020   Glaucoma 11/23/2019   Diabetes ([place]) 10/19/2012   Essential hypertension, benign 09/28/2012   Hyperlipidemia 09/28/2012   Ihor Austin, LPTA/CLT; CBIS [phone]  Aldona Lento, PTA 05/02/2021, 4:57 PM  [place], [place], [address] Phone: [phone]   Fax:  [phone]  Name: Mark Brady MRN: [id number] Date of Birth: [birth year]

## 2021-05-09 ENCOUNTER — Encounter (HOSPITAL_COMMUNITY): Payer: Self-pay | Admitting: Physical Therapy

## 2021-05-09 ENCOUNTER — Ambulatory Visit (HOSPITAL_COMMUNITY): Payer: Medicare Other | Admitting: Physical Therapy

## 2021-05-09 ENCOUNTER — Other Ambulatory Visit: Payer: Self-pay

## 2021-05-09 DIAGNOSIS — R2689 Other abnormalities of gait and mobility: Secondary | ICD-10-CM

## 2021-05-09 DIAGNOSIS — R262 Difficulty in walking, not elsewhere classified: Secondary | ICD-10-CM

## 2021-05-09 NOTE — Therapy (Signed)
[place], [place], [address] Phone: [phone]   Fax:  [phone]  Physical Therapy Treatment  Patient Details  Name: Mark Brady MRN: [id number] Date of Birth: 04-30-1929 Referring Provider (PT): Wells Guiles Tat DO   Encounter Date: 05/09/2021   PT End of Session - 05/09/21 1350     Visit Number 4    Number of Visits 16    Date for PT Re-Evaluation 06/01/21    Authorization Type [employer]    Progress Note Due on Visit 10    PT Start Time 1347    PT Stop Time 1429    PT Time Calculation (min) 42 min    Equipment Utilized During Treatment Gait belt    Activity Tolerance Patient tolerated treatment well;Patient limited by fatigue    Behavior During Therapy [facility] for tasks assessed/performed             Past Medical History:  Diagnosis Date   Arthritis    Diabetes mellitus without complication ([place])    diet controlled   History of elevated PSA    Hyperlipidemia    Hypertension    Stroke [facility])     Past Surgical History:  Procedure Laterality Date   CATARACT EXTRACTION W/PHACO Right 01/26/2014   Procedure: CATARACT EXTRACTION PHACO AND INTRAOCULAR LENS PLACEMENT ([place]);  Surgeon: Elta Guadeloupe T. Gershon Crane, MD;  Location: [facility];  Service: Ophthalmology;  Laterality: Right;  CDE 6.53   CATARACT EXTRACTION W/PHACO Left 02/09/2014   Procedure: CATARACT EXTRACTION PHACO AND INTRAOCULAR LENS PLACEMENT ([facility]);  Surgeon: Elta Guadeloupe T. Gershon Crane, MD;  Location: [facility];  Service: Ophthalmology;  Laterality: Left;  CDE:5.58   COLONOSCOPY     YAG LASER APPLICATION Right 7/93/9030   Procedure: YAG LASER APPLICATION;  Surgeon: Rutherford Guys, MD;  Location: [facility];  Service: Ophthalmology;  Laterality: Right;    There were no vitals filed for this visit.   Subjective Assessment - 05/09/21 1349     Subjective Patient reports doing pretty good, no new reports.    Currently in Pain? No/denies                                [facility] Adult PT Treatment/Exercise - 05/09/21 0001       Knee/Hip Exercises: Stretches   Other Knee/Hip Stretches seated knee flexion 5 x 10" for RT knee      Knee/Hip Exercises: Aerobic   Nustep UE/LE coordination x 5 min L2      Knee/Hip Exercises: Standing   Hip Flexion Both;2 sets;20 reps    Hip Flexion Limitations to bar with 2 lb weights    Gait Training high knee marching in // bars 4 RT, sidestepping in bars 4 RT with 2lb, retro walking in bars 4 RT      Knee/Hip Exercises: Seated   Sit to Sand 20 reps   with overhead reach, 2 x 10                      PT Short Term Goals - 04/06/21 1608       PT SHORT TERM GOAL #1   Title Patient will be independent with initial HEP and self-management strategies to improve functional outcomes    Time 4    Period Weeks    Status New    Target Date 05/04/21      PT SHORT TERM GOAL #2  Title Patient will improve DGI by at least 3 points to demo improved balance and reduced risk for falls.    Time 4    Period Weeks    Status New    Target Date 05/04/21               PT Long Term Goals - 04/06/21 1621       PT LONG TERM GOAL #1   Title Patient will improve FOTO score to predicted vlaue to indicate improvement in functional outcomes    Time 8    Period Weeks    Status New    Target Date 06/01/21      PT LONG TERM GOAL #2   Title Patient will be able to perform stand x 5 in < 20 seconds to demonstrate improvement in functional mobility and reduced risk for falls.    Time 8    Period Weeks    Status New    Target Date 06/01/21      PT LONG TERM GOAL #3   Title Patient will improve DGI to at least 16/24 points to demo improved balance and reduced risk for falls.    Time 8    Period Weeks    Status New    Target Date 06/01/21                   Plan - 05/09/21 1428     Clinical Impression Statement Patient progressed with dynamic balance and gait  activity. Patient tolerated well overall but does require frequent verbal cues for proper form and consistent speed with activity. Patient notes some discomfort in RT knee and keeps leg extended with sit to stands. Added seated knee flexion stretching to improve knee mobility. Patient notes increased fatigue end of session. Patient will continue to benefit from skilled therapy services to reduce deficits and improve functional level.    Personal Factors and Comorbidities Comorbidity 3+    Examination-Activity Limitations Locomotion Level;Transfers;Stand;Stairs    Examination-Participation Restrictions Yard Work;Community Activity;Cleaning    Stability/Clinical Decision Making Evolving/Moderate complexity    Rehab Potential Fair    PT Frequency 2x / week    PT Duration 8 weeks    PT Treatment/Interventions ADLs/Self Care Home Management;Biofeedback;Gait training;Cryotherapy;Stair training;DME Instruction;Patient/family education;Scar mobilization;Compression bandaging;Neuromuscular re-education;Ultrasound;Parrafin;Fluidtherapy;Contrast Bath;Visual/perceptual remediation/compensation;Spinal Manipulations;Passive range of motion;Dry needling;Joint Manipulations;Energy conservation;Orthotic Fit/Training;Splinting;Taping;Functional mobility training;Electrical Stimulation;Iontophoresis 4mg /ml Dexamethasone;Therapeutic activities;Vasopneumatic Device;Manual techniques;Therapeutic exercise;Moist Heat;Traction;Balance training;Manual lymph drainage;Vestibular;Canalith Repostioning    PT Next Visit Plan Progress functional LE strength, gait and static balance. Progress to more challenging dynamic balance tasks. Work on gait with AD    PT Home Exercise Plan Eval: seated march, LAQs 1/10 heel raise/ toe raise, sit/stand, tandem stance with support    Consulted and Agree with Plan of Care Family member/caregiver    Family Member Consulted wife Vaughan Basta             Patient will benefit from skilled therapeutic  intervention in order to improve the following deficits and impairments:  Abnormal gait, Hypomobility, Decreased strength, Pain, Decreased balance, Difficulty walking, Improper body mechanics, Decreased mobility, Decreased activity tolerance, Decreased coordination  Visit Diagnosis: Difficulty in walking, not elsewhere classified  Other abnormalities of gait and mobility     Problem List Patient Active Problem List   Diagnosis Date Noted   History of TIA (transient ischemic attack) 02/02/2021   Parkinsonian features 02/02/2021   Skin lesion 02/02/2021   Sialorrhea 05/14/2020   Gastroesophageal reflux disease without esophagitis 05/13/2020   Glaucoma 11/23/2019  Diabetes ([place]) 10/19/2012   Essential hypertension, benign 09/28/2012   Hyperlipidemia 09/28/2012   2:29 PM, 05/09/21 Josue Hector PT DPT  Physical Therapist with [facility]  [phone] [place], [place], [address] Phone: [phone]   Fax:  [phone]  Name: Mark Brady MRN: [id number] Date of Birth: September 21, 1929

## 2021-05-12 NOTE — Progress Notes (Signed)
Assessment/Plan:   1.  Parkinsons Disease, diagnosed October, 2022  -Continue carbidopa/levodopa 25/100, 1 tablet 3 times per day.  2.  Sialorrhea  -just got Myobloc authorization but they decide to hold on that since sialorrhea better with leovdopa  -Failed Robinul  3.  Constipation  -discussed nature and pathophysiology and association with PD  -discussed importance of hydration.  Pt is to increase water intake  -pt is given a copy of the rancho recipe  -recommended daily colace  -recommended miralax prn    Subjective:   Mark Brady was seen today in follow up for Parkinsons disease.  My previous records were reviewed prior to todays visit as well as outside records available to me.  Spoke with Dr. Lacinda Axon about this patient as well.   Pt with wife who supplements hx.   Patient started on levodopa last visit.  He has also been going to therapy and those notes have been reviewed.  Patient has done very well with both of these things.  Reports medications and therapies have been helping him.  Pt with one fall - was getting out of the truck and holding the grab bar and hand let loose and he fell.  He didn't get anything but a bruise on hip.  Pt denies lightheadedness, near syncope.  In regards to hallucinations, he is doing great - "no boogy man." Mood has been good.  Drooling is all but gone  Current prescribed movement disorder medications: Carbidopa/levodopa 25/100, 1 tablet 3 times per day (started last visit)   PREVIOUS MEDICATIONS: Sinemet  ALLERGIES:   Allergies  Allergen Reactions   Penicillins    Aspirin Rash    CURRENT MEDICATIONS:  No outpatient medications have been marked as taking for the 05/15/21 encounter (Office Visit) with Daysi Boggan, Eustace Quail, DO.     Objective:   PHYSICAL EXAMINATION:    VITALS:   Vitals:   05/15/21 1352  BP: 123/72  Pulse: 69  SpO2: 96%  Weight: 170 lb 3.2 oz (77.2 kg)  Height: 5\' 8"  (1.727 m)    GEN:  The patient appears  stated age and is in NAD. HEENT:  Normocephalic, atraumatic.  The mucous membranes are moist. The superficial temporal arteries are without ropiness or tenderness. CV:  RRR Lungs:  CTAB Neck/HEME:  There are no carotid bruits bilaterally.  Neurological examination:  Orientation: The patient is alert and oriented x3. Cranial nerves: There is good facial symmetry with facial hypomimia. The speech is fluent and hypophonic. Soft palate rises symmetrically and there is no tongue deviation. Hearing is decreased to conversational tone. Sensation: Sensation is intact to light touch throughout Motor: Strength is at least antigravity x4.  Movement examination: Tone: There is normal tone in the UE/LE Abnormal movements: none Coordination:  There is mild decremation with RAM's, with any form of RAMS, including alternating supination and pronation of the forearm, hand opening and closing, finger taps, heel taps and toe taps, bilaterally, L slightly>R (slowness more than decremation) Gait and Station: The patient has difficulty arising out of a deep-seated chair without the use of the hands.  He pushes off to arise (3 attempts).  The patient's stride length is decreased but less shuffling.    I have reviewed and interpreted the following labs independently    Chemistry      Component Value Date/Time   NA 140 08/24/2020 1403   K 4.2 08/24/2020 1403   CL 100 08/24/2020 1403   CO2 21 08/24/2020 1403  BUN 19 08/24/2020 1403   CREATININE 0.90 08/24/2020 1403   CREATININE 1.15 09/21/2013 0719      Component Value Date/Time   CALCIUM 9.4 08/24/2020 1403   ALKPHOS 93 08/24/2020 1403   AST 10 08/24/2020 1403   ALT 11 08/24/2020 1403   BILITOT 0.3 08/24/2020 1403       Lab Results  Component Value Date   WBC 9.0 08/24/2020   HGB 13.3 08/24/2020   HCT 41.7 08/24/2020   MCV 95 08/24/2020   PLT 371 08/24/2020    Lab Results  Component Value Date   TSH 3.819 09/05/2014       Cc:   Coral Spikes, DO

## 2021-05-15 ENCOUNTER — Ambulatory Visit: Payer: Medicare Other | Admitting: Neurology

## 2021-05-15 ENCOUNTER — Other Ambulatory Visit: Payer: Self-pay

## 2021-05-15 ENCOUNTER — Encounter: Payer: Self-pay | Admitting: Neurology

## 2021-05-15 VITALS — BP 123/72 | HR 69 | Ht 68.0 in | Wt 170.2 lb

## 2021-05-15 DIAGNOSIS — G20 Parkinson's disease: Secondary | ICD-10-CM | POA: Diagnosis not present

## 2021-05-15 DIAGNOSIS — F0280 Dementia in other diseases classified elsewhere without behavioral disturbance: Secondary | ICD-10-CM

## 2021-05-15 NOTE — Patient Instructions (Signed)
Parkinson's Disease Parkinson's disease is a movement disorder. It is a long-term condition that gets worse over time. Each person with Parkinson's disease is affected differently. This condition limits a person's ability to control movements and move the body normally. The condition can range from mild to severe. Parkinson's disease tends to get worse slowly over several years. What are the causes? Parkinson's disease is caused by a loss of brain cells (neurons) that make a brain chemical called dopamine. Dopamine is needed to control movement. As the condition gets worse, more neurons that make dopamine die. This makes it hard to move or control your movements. The exact cause of the loss of neurons is not known. Genes and the environment may contribute to the cause of Parkinson's disease. What increases the risk? The following factors may make you more likely to develop this condition: Being male. Being age 13 or older. Having a family history of Parkinson's disease. Having had a traumatic brain injury. Having been exposed to toxins, such as pesticides. Having depression. What are the signs or symptoms? Symptoms of this condition can vary. The main symptoms are related to movement. These include: A tremor or shaking while you are resting. You cannot control the shaking. Stiffness in your arms and legs (rigidity). Slowing of movement. You may lose facial expressions and have trouble making small movements that are needed to button clothing or brush your teeth. An abnormal walk. You may walk with short, shuffling steps. Loss of balance and stability when standing. You may sway, fall backward, and have trouble making turns. Other symptoms include: Mental or cognitive changes, including: Depression or anxiety. Having false beliefs (delusions). Seeing, hearing, or feeling things that do not exist (hallucinations). Trouble speaking or swallowing. Changes in bowel or bladder functions,  including constipation, having to go urgently or frequently, or not being able to control your bowel or bladder. Changes in sleep habits, acting out dreams, or trouble sleeping. Depending on the severity of the symptoms, Parkinson's disease may be mild, moderate, or advanced. Parkinson's disease progression is different for everyone. Some people may not progress to the advanced stage. Mild Parkinson's disease involves: Movement problems that do not affect daily activities. Movement problems on one side of the body. Moderate Parkinson's disease involves: Movement problems on both sides of the body. Slowing of movement. Coordination and balance problems. Advanced Parkinson's disease involves: Extreme difficulty walking. Inability to live alone safely. Signs of dementia, such as having trouble remembering things, doing daily tasks such as getting dressed, and problem solving. How is this diagnosed? This condition is diagnosed by a specialist. A diagnosis may be made based on symptoms, your medical history, and a physical exam. You may also have brain imaging tests to check for loss of neurons in the brain. How is this treated? There is no cure for Parkinson's disease. Treatment focuses on managing your symptoms. Treatment may include: Medicines. Everyone responds to medicines differently. Your response may change over time. Work with your health care provider to find the best medicines for you. Speech, occupational, and physical therapy. Deep brain stimulation surgery to reduce tremors and other involuntary movements. Follow these instructions at home: Medicines Take over-the-counter and prescription medicines only as told by your health care provider. Avoid taking medicines that can affect thinking, such as pain or sleeping medicines. Eating and drinking Follow instructions from your health care provider about eating or drinking restrictions. Do not drink alcohol. Activity Ask your health  care provider if it is safe for you  to drive. Do exercises as told by your health care provider or physical therapist. Lifestyle  Install grab bars and railings in your home to prevent falls. Do not use any products that contain nicotine or tobacco. These products include cigarettes, chewing tobacco, and vaping devices, such as e-cigarettes. If you need help quitting, ask your health care provider. Consider joining a support group for people with Parkinson's disease. General instructions Work with your health care provider to know the kind of day-to-day help that you may need and what to do to stay safe. Keep all follow-up visits. This is important. Follow-up visits include any visits with a physical therapist, speech therapist, or occupational therapist. Where to find more information [employer] of Neurological Disorders and Stroke: [employer] [place]: www.parkinson.org Contact a health care provider if: Medicines do not help your symptoms. You are unsteady or have fallen at home. You need more support to function well at home. You have trouble swallowing. You have severe constipation. You are having problems with side effects from your medicines. You feel confused, anxious, depressed, or have hallucinations. Get help right away if you: Are injured after a fall. Cannot swallow without choking. Have chest pain or trouble breathing. Do not feel safe at home. Have thoughts about hurting yourself or others. These symptoms may represent a serious problem that is an emergency. Do not wait to see if the symptoms will go away. Get medical help right away. Call your local emergency services (911 in the [place] Do not drive yourself to the hospital. If you ever feel like you may hurt yourself or others, or have thoughts about taking your own life, get help right away. Go to your nearest emergency department or: Call your local emergency services (911 in the  [place] Call a suicide crisis helpline, such as the [facility] at 870-577-7166 or 988 in the [place]. This is open 24 hours a day in the [place] Text the Crisis Text Line at 785-007-9886 (in the [place].). Summary Parkinson's disease is a long-term condition that gets worse over time. This condition limits your ability to control your movements and move your body normally. There is no cure for Parkinson's disease. Treatment focuses on managing your symptoms. Work with your health care provider to know the kind of day-to-day help that you may need and what to do to stay safe. Keep all follow-up visits, including any visits with a physical therapist, speech therapist, or occupational therapist. This is important. This information is not intended to replace advice given to you by your health care provider. Make sure you discuss any questions you have with your health care provider. Document Revised: 10/26/2020 Document Reviewed: 07/18/2020 Elsevier Patient Education  [place].

## 2021-05-16 ENCOUNTER — Ambulatory Visit (HOSPITAL_COMMUNITY): Payer: Medicare Other | Admitting: Physical Therapy

## 2021-05-16 DIAGNOSIS — R262 Difficulty in walking, not elsewhere classified: Secondary | ICD-10-CM | POA: Diagnosis not present

## 2021-05-16 DIAGNOSIS — R2689 Other abnormalities of gait and mobility: Secondary | ICD-10-CM

## 2021-05-16 NOTE — Therapy (Signed)
[place], [place], [address] Phone: [phone]   Fax:  [phone]  Physical Therapy Treatment  Patient Details  Name: Mark Brady MRN: [id number] Date of Birth: [birth year] Referring Provider (PT): Wells Guiles Tat DO   Encounter Date: 05/16/2021   PT End of Session - 05/16/21 1436     Visit Number 5    Number of Visits 16    Date for PT Re-Evaluation 06/01/21    Authorization Type [employer] MEdic[employer]    Progress Note Due on Visit 10    PT Start Time 1431    PT Stop Time 1513    PT Time Calculation (min) 42 min    Equipment Utilized During Treatment Gait belt    Activity Tolerance Patient tolerated treatment well    Behavior During Therapy WFL for tasks assessed/performed             Past Medical History:  Diagnosis Date   Arthritis    Diabetes mellitus without complication ([place])    diet controlled   History of elevated PSA    Hyperlipidemia    Hypertension    Stroke [facility])     Past Surgical History:  Procedure Laterality Date   CATARACT EXTRACTION W/PHACO Right 01/26/2014   Procedure: CATARACT EXTRACTION PHACO AND INTRAOCULAR LENS PLACEMENT ([place]);  Surgeon: Elta Guadeloupe T. Gershon Crane, MD;  Location: [facility];  Service: Ophthalmology;  Laterality: Right;  CDE 6.53   CATARACT EXTRACTION W/PHACO Left 02/09/2014   Procedure: CATARACT EXTRACTION PHACO AND INTRAOCULAR LENS PLACEMENT ([facility]);  Surgeon: Elta Guadeloupe T. Gershon Crane, MD;  Location: [facility];  Service: Ophthalmology;  Laterality: Left;  CDE:5.58   COLONOSCOPY     YAG LASER APPLICATION Right 6/73/4193   Procedure: YAG LASER APPLICATION;  Surgeon: Rutherford Guys, MD;  Location: [facility];  Service: Ophthalmology;  Laterality: Right;    There were no vitals filed for this visit.   Subjective Assessment - 05/16/21 1436     Subjective Had follow up with MD yesterday. Things are going well. No new complaints.    Currently in Pain? No/denies                                [facility] Adult PT Treatment/Exercise - 05/16/21 0001       Knee/Hip Exercises: Aerobic   Nustep UE/LE coordination x 5 min L2      Knee/Hip Exercises: Standing   Gait Training --    Other Standing Knee Exercises high knee marching with opposite UE raise 2 x10, alt arm raises x15, alt LE march x 15 sidestepping 2 RT with, retro walking 2 RT, tandem gait 2 RT    Other Standing Knee Exercises color cone toe touch 5 min      Knee/Hip Exercises: Seated   Other Seated Knee/Hip Exercises alt arm and leg raises x20    Sit to Sand 10 reps   with overhead reach                      PT Short Term Goals - 04/06/21 1608       PT SHORT TERM GOAL #1   Title Patient will be independent with initial HEP and self-management strategies to improve functional outcomes    Time 4    Period Weeks    Status New    Target Date 05/04/21      PT SHORT TERM GOAL #2  Title Patient will improve DGI by at least 3 points to demo improved balance and reduced risk for falls.    Time 4    Period Weeks    Status New    Target Date 05/04/21               PT Long Term Goals - 04/06/21 1621       PT LONG TERM GOAL #1   Title Patient will improve FOTO score to predicted vlaue to indicate improvement in functional outcomes    Time 8    Period Weeks    Status New    Target Date 06/01/21      PT LONG TERM GOAL #2   Title Patient will be able to perform stand x 5 in < 20 seconds to demonstrate improvement in functional mobility and reduced risk for falls.    Time 8    Period Weeks    Status New    Target Date 06/01/21      PT LONG TERM GOAL #3   Title Patient will improve DGI to at least 16/24 points to demo improved balance and reduced risk for falls.    Time 8    Period Weeks    Status New    Target Date 06/01/21                   Plan - 05/16/21 1536     Clinical Impression Statement Patient was very challenged with added  sequencing and coordination activity this date. Requires max verbal cues for appropriate use of LT vs Right with marching as well as arm raises. Modified activity to segment up and lower extremity coordination with limited improvement. Also very challenge with color coordination tasks with cone taps. Will continue to progress sequencing and coordinating activity as able for improved balance and functional mobility.    Personal Factors and Comorbidities Comorbidity 3+    Examination-Activity Limitations Locomotion Level;Transfers;Stand;Stairs    Examination-Participation Restrictions Yard Work;Community Activity;Cleaning    Stability/Clinical Decision Making Evolving/Moderate complexity    Rehab Potential Fair    PT Frequency 2x / week    PT Duration 8 weeks    PT Treatment/Interventions ADLs/Self Care Home Management;Biofeedback;Gait training;Cryotherapy;Stair training;DME Instruction;Patient/family education;Scar mobilization;Compression bandaging;Neuromuscular re-education;Ultrasound;Parrafin;Fluidtherapy;Contrast Bath;Visual/perceptual remediation/compensation;Spinal Manipulations;Passive range of motion;Dry needling;Joint Manipulations;Energy conservation;Orthotic Fit/Training;Splinting;Taping;Functional mobility training;Electrical Stimulation;Iontophoresis 4mg /ml Dexamethasone;Therapeutic activities;Vasopneumatic Device;Manual techniques;Therapeutic exercise;Moist Heat;Traction;Balance training;Manual lymph drainage;Vestibular;Canalith Repostioning    PT Next Visit Plan Progress functional LE strength, gait and static balance. Coordination and sequencing. Work on gait with AD    PT Home Exercise Plan Eval: seated march, LAQs 1/10 heel raise/ toe raise, sit/stand, tandem stance with support 1/31 alt seated marching UE/ LE    Consulted and Agree with Plan of Care Family member/caregiver    Family Member Consulted wife Vaughan Basta             Patient will benefit from skilled therapeutic  intervention in order to improve the following deficits and impairments:  Abnormal gait, Hypomobility, Decreased strength, Pain, Decreased balance, Difficulty walking, Improper body mechanics, Decreased mobility, Decreased activity tolerance, Decreased coordination  Visit Diagnosis: Difficulty in walking, not elsewhere classified  Other abnormalities of gait and mobility     Problem List Patient Active Problem List   Diagnosis Date Noted   History of TIA (transient ischemic attack) 02/02/2021   Parkinsonian features 02/02/2021   Skin lesion 02/02/2021   Sialorrhea 05/14/2020   Gastroesophageal reflux disease without esophagitis 05/13/2020   Glaucoma 11/23/2019   Diabetes (  [place]) 10/19/2012   Essential hypertension, benign 09/28/2012   Hyperlipidemia 09/28/2012   3:38 PM, 05/16/21 Josue Hector PT DPT  Physical Therapist with [facility]  [phone] [place], [place], [address] Phone: [phone]   Fax:  [phone]  Name: Mark Brady MRN: [id number] Date of Birth: 01-25-1930

## 2021-05-19 ENCOUNTER — Telehealth: Payer: Self-pay

## 2021-05-19 NOTE — Telephone Encounter (Signed)
Patient has decided against doing Botox treatments

## 2021-05-23 ENCOUNTER — Ambulatory Visit (HOSPITAL_COMMUNITY): Payer: Medicare Other | Admitting: Physical Therapy

## 2021-05-24 ENCOUNTER — Other Ambulatory Visit: Payer: Self-pay | Admitting: Family Medicine

## 2021-05-31 ENCOUNTER — Ambulatory Visit (INDEPENDENT_AMBULATORY_CARE_PROVIDER_SITE_OTHER): Payer: Medicare Other | Admitting: Family Medicine

## 2021-05-31 ENCOUNTER — Ambulatory Visit (HOSPITAL_COMMUNITY)
Admission: RE | Admit: 2021-05-31 | Discharge: 2021-05-31 | Disposition: A | Discharge status: Home / Self Care | Payer: Medicare Other | Source: Ambulatory Visit | Attending: Family Medicine | Admitting: Family Medicine

## 2021-05-31 ENCOUNTER — Other Ambulatory Visit: Payer: Self-pay

## 2021-05-31 ENCOUNTER — Ambulatory Visit: Payer: Medicare Other | Admitting: Family Medicine

## 2021-05-31 VITALS — Temp 98.4°F | Ht 68.0 in | Wt 170.0 lb

## 2021-05-31 DIAGNOSIS — R059 Cough, unspecified: Secondary | ICD-10-CM

## 2021-05-31 DIAGNOSIS — G20 Parkinson's disease: Secondary | ICD-10-CM | POA: Diagnosis not present

## 2021-05-31 DIAGNOSIS — K219 Gastro-esophageal reflux disease without esophagitis: Secondary | ICD-10-CM | POA: Diagnosis present

## 2021-05-31 MED ORDER — FAMOTIDINE 40 MG PO TABS: 40.0000 mg | ORAL_TABLET | Freq: Every day | ORAL | 1 refills | Status: DC

## 2021-05-31 NOTE — Progress Notes (Signed)
° °  Subjective:    Patient ID: Mark Brady, male    DOB: [birth year], 88 y.o.   MRN: [id number]  Cough This is a new problem. The current episode started 1 to 4 weeks ago.   Patient also having issues with reflux Frequent regurgitation issues according to the family where he has heartburn and discomfort no dysphagia.  Using OTC calcium tablets as well as other over-the-counter Gaviscon like medications No projectile vomiting no rectal bleeding  Also having frequent coughing mainly in the morning time not as much later in the day does raise into some question whether or not there could be some silent regurgitation or possibility of postnasal drainage there is no fever wheezing no definite shortness of breath once again no dysphagia  Patient being treated for Parkinson's disease.  Will be following up with PCP later this spring follows up with neurology on a regular basis Review of Systems  Respiratory:  Positive for cough.       Objective:   Physical Exam Gen-NAD not toxic TMS-normal bilateral T- normal no redness Chest-CTA respiratory rate normal no crackles CV RRR no murmur Skin-warm dry Neuro-grossly normal        Assessment & Plan:  1. Cough, unspecified type The persistent cough over the past couple weeks could be due to a virus but could also be due to silent regurgitation related to reflux recommend chest x-ray to rule out possibility of pneumonia exam benign - DG Chest 2 View  2. Gastroesophageal reflux disease without esophagitis Patient with significant reflux related issues heartburn frequently requesting Gaviscon from his wife on a regular basis takes Protonix every day we will add 1 dose of Pepcid to this regimen avoid large meals close to bedtime He sleeps in a recliner moderately propped up Certainly if progressive troubles or worse to follow-up sooner no dysphagia - DG Chest 2 View  3. Parkinson's disease ([place]) He is on medications for this currently but  this could be contributing to some of his cough due to inability to swallow secretions on a regular basis

## 2021-06-12 ENCOUNTER — Other Ambulatory Visit: Payer: Self-pay | Admitting: Family Medicine

## 2021-06-14 ENCOUNTER — Encounter (HOSPITAL_COMMUNITY): Payer: Self-pay | Admitting: Physical Therapy

## 2021-06-14 NOTE — Therapy (Signed)
[place] ?[facility] ?[address] ?[place], [place], [address] ?Phone: [phone]   Fax:  [phone] ? ?Patient Details  ?Name: Mark Brady ?MRN: [id number] ?Date of Birth: 01/18/1930 ?Referring Provider:  No ref. provider found ? ?Encounter Date: 06/14/2021 ? ?PHYSICAL THERAPY DISCHARGE SUMMARY ? ?Visits from Start of Care: 5 ? ?Current functional level related to goals / functional outcomes: ?NA ?  ?Remaining deficits: ?NA ?  ?Education / Equipment: ?Patient DC per non return   ? ?Patient agrees to discharge. Patient goals were not met. Patient is being discharged due to not returning since the last visit. ? ?11:34 AM, 06/14/21 ?  PT DPT  ?Physical Therapist with [place]  ?[facility]  ?[phone] ? ? ?[place] ?[facility] ?[address] ?, [place], [address] ?Phone: [phone]   Fax:  [phone] ?

## 2021-07-24 ENCOUNTER — Ambulatory Visit (INDEPENDENT_AMBULATORY_CARE_PROVIDER_SITE_OTHER): Payer: Medicare Other | Admitting: Family Medicine

## 2021-07-24 VITALS — BP 122/61 | HR 59 | Temp 97.6°F | Ht 68.0 in | Wt 170.6 lb

## 2021-07-24 DIAGNOSIS — E119 Type 2 diabetes mellitus without complications: Secondary | ICD-10-CM

## 2021-07-24 DIAGNOSIS — G20A1 Parkinson's disease without dyskinesia, without mention of fluctuations: Secondary | ICD-10-CM

## 2021-07-24 DIAGNOSIS — I10 Essential (primary) hypertension: Secondary | ICD-10-CM

## 2021-07-24 DIAGNOSIS — G20 Parkinson's disease: Secondary | ICD-10-CM

## 2021-07-24 DIAGNOSIS — E785 Hyperlipidemia, unspecified: Secondary | ICD-10-CM

## 2021-07-24 DIAGNOSIS — Z79899 Other long term (current) drug therapy: Secondary | ICD-10-CM | POA: Diagnosis not present

## 2021-07-24 NOTE — Progress Notes (Signed)
? ?Subjective:  ?Patient ID: Mark Brady, male    DOB: [birth year]  Age: 88 y.o. MRN: [id number] ? ?CC: ?Chief Complaint  ?Patient presents with  ? Hypertension  ? ? ?HPI: ? ?88 year old male with Parkinson's disease, hypertension, type 2 diabetes, hyperlipidemia presents for follow-up. ? ?Patient has had a significant improvement in Parkinson's after seeing neurology and being placed on treatment.  Sialorrhea has improved as well.  Patient and his wife are pleased with his current progress. ? ?Hypertension is well controlled on amlodipine and losartan. ? ?Hyperlipidemia has been stable and fairly well controlled on pravastatin. ? ?Patient has not had an A1c obtained in quite some time.  He is on no pharmacotherapy regarding diabetes at this time.  Needs labs today. ? ?Patient Active Problem List  ? Diagnosis Date Noted  ? Parkinson's disease ([place]) 07/24/2021  ? History of TIA (transient ischemic attack) 02/02/2021  ? Sialorrhea 05/14/2020  ? Gastroesophageal reflux disease without esophagitis 05/13/2020  ? Glaucoma 11/23/2019  ? Diabetes ([place]) 10/19/2012  ? Essential hypertension, benign 09/28/2012  ? Hyperlipidemia 09/28/2012  ? ? ?Social Hx   ?Social History  ? ?Socioeconomic History  ? Marital status: Married  ?  Spouse name: Vaughan Basta  ? Number of children: 5  ? Years of education: Not on file  ? Highest education level: Not on file  ?Occupational History  ? Occupation: retired  ?  Comment: tobacco and beef farmer  ?Tobacco Use  ? Smoking status: Never  ? Smokeless tobacco: Never  ?Substance and Sexual Activity  ? Alcohol use: No  ? Drug use: No  ? Sexual activity: Yes  ?  Birth control/protection: None  ?Other Topics Concern  ? Not on file  ?Social History Narrative  ? 3children, all live nearby.  ? Grandchildren and great grandchildren.  ? ?Social Determinants of Health  ? ?Financial Resource Strain: Low Risk   ? Difficulty of Paying Living Expenses: Not hard at all  ?Food Insecurity: No Food Insecurity  ?  Worried About Charity fundraiser in the Last Year: Never true  ? Mark Out of Food in the Last Year: Never true  ?Transportation Needs: Unknown  ? Lack of Transportation (Medical): No  ? Lack of Transportation (Non-Medical): Not on file  ?Physical Activity: Sufficiently Active  ? Days of Exercise per Week: 5 days  ? Minutes of Exercise per Session: 30 min  ?Stress: No Stress Concern Present  ? Feeling of Stress : Not at all  ?Social Connections: Socially Integrated  ? Frequency of Communication with Friends and Family: More than three times a week  ? Frequency of Social Gatherings with Friends and Family: More than three times a week  ? Attends Religious Services: More than 4 times per year  ? Active Member of Clubs or Organizations: Yes  ? Attends Archivist Meetings: More than 4 times per year  ? Marital Status: Married  ? ? ?Review of Systems  ?Constitutional: Negative.   ?Respiratory: Negative.    ?Cardiovascular: Negative.   ?Musculoskeletal:  Positive for gait problem.  ? ?Objective:  ?BP 122/61   Pulse (!) 59   Temp 97.6 ?F (36.4 ?C) (Oral)   Ht '5\' 8"'  (1.727 m)   Wt 170 lb 9.6 oz (77.4 kg)   SpO2 98%   BMI 25.94 kg/m?  ? ? ?  07/24/2021  ? 10:03 AM 05/31/2021  ?  3:50 PM 05/15/2021  ?  1:52 PM  ?BP/Weight  ?Systolic BP 010  315  ?Diastolic BP 61  72  ?Wt. (Lbs) 170.6 170 170.2  ?BMI 25.94 kg/m2 25.85 kg/m2 25.88 kg/m2  ? ? ?Physical Exam ?Vitals and nursing note reviewed.  ?Constitutional:   ?   General: He is not in acute distress. ?   Appearance: Normal appearance.  ?HENT:  ?   Head: Normocephalic and atraumatic.  ?Eyes:  ?   Conjunctiva/sclera: Conjunctivae normal.  ?Cardiovascular:  ?   Rate and Rhythm: Normal rate and regular rhythm.  ?Pulmonary:  ?   Effort: Pulmonary effort is normal.  ?   Breath sounds: Normal breath sounds.  ?Abdominal:  ?   General: There is no distension.  ?   Palpations: Abdomen is soft.  ?   Tenderness: There is no abdominal tenderness.  ?Neurological:  ?   Mental  Status: He is alert.  ?   Comments: Masked facies ?Shuffling gait.  ?Psychiatric:  ?   Comments: Flat affect.  ? ? ?Lab Results  ?Component Value Date  ? WBC 9.0 08/24/2020  ? HGB 13.3 08/24/2020  ? HCT 41.7 08/24/2020  ? PLT 371 08/24/2020  ? GLUCOSE 129 (H) 08/24/2020  ? CHOL 156 08/19/2019  ? TRIG 135 08/19/2019  ? HDL 56 08/19/2019  ? [place] 76 08/19/2019  ? ALT 11 08/24/2020  ? AST 10 08/24/2020  ? NA 140 08/24/2020  ? K 4.2 08/24/2020  ? CL 100 08/24/2020  ? CREATININE 0.90 08/24/2020  ? BUN 19 08/24/2020  ? CO2 21 08/24/2020  ? TSH 3.819 09/05/2014  ? INR 0.90 06/02/2010  ? HGBA1C 7.0 (H) 08/24/2020  ? MICROALBUR 10 01/31/2016  ? ? ? ?Assessment & Plan:  ? ?Problem List Items Addressed This Visit   ? ?  ? Cardiovascular and Mediastinum  ? Essential hypertension, benign  ?  Stable.  Continue amlodipine and losartan.  Labs ordered. ?  ?  ?  ? Endocrine  ? Diabetes ([place]) - Primary  ?  A1c ordered. ?  ?  ? Relevant Orders  ? CMP14+EGFR  ? Hemoglobin A1c  ?  ? Nervous and Auditory  ? Parkinson's disease ([place])  ?  Doing well.  Continue close follow-up with neurology. ?  ?  ?  ? Other  ? Hyperlipidemia  ?  Has been stable.  Continue pravastatin. ?  ?  ? Relevant Orders  ? Lipid panel  ? ?Other Visit Diagnoses   ? ? High risk medication use      ? Relevant Orders  ? CBC  ? ?  ? ? ?Follow-up:  6 months ? ?Thersa Salt DO ?[place] ? ?

## 2021-07-24 NOTE — Assessment & Plan Note (Signed)
Has been stable.  Continue pravastatin. ?

## 2021-07-24 NOTE — Assessment & Plan Note (Signed)
Doing well.  Continue close follow-up with neurology. ?

## 2021-07-24 NOTE — Assessment & Plan Note (Signed)
Stable.  Continue amlodipine and losartan.  Labs ordered. ?

## 2021-07-24 NOTE — Patient Instructions (Signed)
He appears to be doing well (stable). ? ?Continue current medications. ? ?Labs at your convenience. ? ?Follow up in 6 months. ? ?Take care ? ?Dr. Lacinda Axon  ?

## 2021-07-24 NOTE — Assessment & Plan Note (Signed)
A1c ordered.

## 2021-07-31 ENCOUNTER — Telehealth: Payer: Self-pay

## 2021-07-31 NOTE — Telephone Encounter (Signed)
Caller name:Enoc Carrender  ? ?On DPR? :Yes ? ?Call back number:[phone] ? ?Provider they see: Lacinda Axon  ? ?Reason for call:Pt dropped off form for Disability Parking form placed in Dr Lacinda Axon folder to be signed  ? ?

## 2021-08-01 LAB — CBC
Hematocrit: 40.3 % (ref 37.5–51.0)
Hemoglobin: 13.5 g/dL (ref 13.0–17.7)
MCH: 31.5 pg (ref 26.6–33.0)
MCHC: 33.5 g/dL (ref 31.5–35.7)
MCV: 94 fL (ref 79–97)
Platelets: 310 10*3/uL (ref 150–450)
RBC: 4.28 x10E6/uL (ref 4.14–5.80)
RDW: 12.8 % (ref 11.6–15.4)
WBC: 9.6 10*3/uL (ref 3.4–10.8)

## 2021-08-01 LAB — CMP14+EGFR
ALT: 9 IU/L (ref 0–44)
AST: 10 IU/L (ref 0–40)
Albumin/Globulin Ratio: 1.5 (ref 1.2–2.2)
Albumin: 4 g/dL (ref 3.6–4.6)
Alkaline Phosphatase: 70 IU/L (ref 44–121)
BUN/Creatinine Ratio: 16 (ref 10–24)
BUN: 18 mg/dL (ref 8–27)
Bilirubin Total: 0.3 mg/dL (ref 0.0–1.2)
CO2: 22 mmol/L (ref 20–29)
Calcium: 9.1 mg/dL (ref 8.6–10.2)
Chloride: 106 mmol/L (ref 96–106)
Creatinine, Ser: 1.13 mg/dL (ref 0.76–1.27)
Globulin, Total: 2.6 g/dL (ref 1.5–4.5)
Glucose: 165 mg/dL — ABNORMAL HIGH (ref 70–99)
Potassium: 4.5 mmol/L (ref 3.5–5.2)
Sodium: 141 mmol/L (ref 134–144)
Total Protein: 6.6 g/dL (ref 6.0–8.5)
eGFR: 62 mL/min/{1.73_m2} (ref >59)

## 2021-08-01 LAB — LIPID PANEL
Chol/HDL Ratio: 2.5 ratio (ref 0.0–5.0)
Cholesterol, Total: 163 mg/dL (ref 100–199)
HDL: 66 mg/dL (ref >39)
LDL Chol Calc (NIH): 70 mg/dL (ref 0–99)
Triglycerides: 162 mg/dL — ABNORMAL HIGH (ref 0–149)
VLDL Cholesterol Cal: 27 mg/dL (ref 5–40)

## 2021-08-01 LAB — HEMOGLOBIN A1C
Est. average glucose Bld gHb Est-mCnc: 143 mg/dL
Hgb A1c MFr Bld: 6.6 % — ABNORMAL HIGH (ref 4.8–5.6)

## 2021-08-03 ENCOUNTER — Other Ambulatory Visit: Payer: Self-pay

## 2021-08-03 MED ORDER — LOSARTAN POTASSIUM 100 MG PO TABS: ORAL_TABLET | ORAL | 0 refills | Status: DC

## 2021-09-06 ENCOUNTER — Other Ambulatory Visit: Payer: Self-pay | Admitting: Family Medicine

## 2021-10-12 LAB — HM DIABETES EYE EXAM

## 2021-10-21 ENCOUNTER — Other Ambulatory Visit: Payer: Self-pay | Admitting: Neurology

## 2021-10-21 DIAGNOSIS — F0280 Dementia in other diseases classified elsewhere without behavioral disturbance: Secondary | ICD-10-CM

## 2021-10-21 DIAGNOSIS — G20 Parkinson's disease: Secondary | ICD-10-CM

## 2021-10-21 DIAGNOSIS — K117 Disturbances of salivary secretion: Secondary | ICD-10-CM

## 2021-10-21 DIAGNOSIS — G20A1 Parkinson's disease without dyskinesia, without mention of fluctuations: Secondary | ICD-10-CM

## 2021-10-23 ENCOUNTER — Other Ambulatory Visit: Payer: Self-pay

## 2021-10-23 DIAGNOSIS — G20 Parkinson's disease: Secondary | ICD-10-CM

## 2021-10-23 DIAGNOSIS — K117 Disturbances of salivary secretion: Secondary | ICD-10-CM

## 2021-10-23 MED ORDER — CARBIDOPA-LEVODOPA 25-100 MG PO TABS: 1.0000 | ORAL_TABLET | Freq: Three times a day (TID) | ORAL | 0 refills | Status: DC

## 2021-11-22 ENCOUNTER — Other Ambulatory Visit: Payer: Self-pay | Admitting: Family Medicine

## 2021-11-22 ENCOUNTER — Other Ambulatory Visit: Payer: Self-pay

## 2021-11-22 MED ORDER — PANTOPRAZOLE SODIUM 40 MG PO TBEC: DELAYED_RELEASE_TABLET | ORAL | 1 refills | Status: DC

## 2021-11-24 NOTE — Progress Notes (Signed)
Assessment/Plan:   1.  Parkinsons Disease, diagnosed October, 2022  -Increase carbidopa/levodopa 25/100, 1.5 tablet 3 times per day.  2.  Sialorrhea  -They want to hold on Myobloc.  Feel that he is doing much better since starting levodopa.  -Failed Robinul  3.  Constipation  -discussed nature and pathophysiology and association with PD  -discussed importance of hydration.  Pt is to increase water intake  -pt is given a copy of the rancho recipe  -recommended daily colace  -recommended miralax prn  4.  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  -discussed nuplazid but they want to hold for now.  Subjective:   Mark Brady was seen today in follow up for Parkinsons disease.  My previous records were reviewed prior to todays visit as well as outside records available to me.  Patient with wife who supplements history.  Notes reviewed since our last visit and I talked with primary care as well.  Patient has done much better.  Pt denies falls.  Pt denies lightheadedness, near syncope.  Occasional hallucination of something at a distance.  Mood has been good.  In regards to drooling, they report that he is doing well there.  Walking some for exercise.    Current prescribed movement disorder medications: Carbidopa/levodopa 25/100, 1 tablet 3 times per day    PREVIOUS MEDICATIONS: Sinemet  ALLERGIES:   Allergies  Allergen Reactions   Penicillins    Aspirin Rash    CURRENT MEDICATIONS:  Current Meds  Medication Sig   Accu-Chek Softclix Lancets lancets USE AS DIRECTED ONCE DAILY   amLODipine (NORVASC) 10 MG tablet Take 1 tablet (10 mg total) by mouth daily.   carbidopa-levodopa (SINEMET IR) 25-100 MG tablet TAKE 1 TABLET BY MOUTH THREE TIMES DAILY   Cholecalciferol (VITAMIN D-3) 125 MCG (5000 UT) TABS Take by mouth.   dorzolamide-timolol (COSOPT) 22.3-6.8 MG/ML ophthalmic solution Place 1 drop into both eyes daily.   famotidine (PEPCID) 40 MG tablet TAKE 1 TABLET(40  MG) BY MOUTH DAILY   glucose blood (ACCU-CHEK AVIVA PLUS) test strip USE TO CHECK BLOOD SUGAR DAILY   losartan (COZAAR) 100 MG tablet TAKE 1 TABLET(100 MG) BY MOUTH DAILY   mupirocin ointment (BACTROBAN) 2 % Apply 1 application topically daily as needed.   pantoprazole (PROTONIX) 40 MG tablet TAKE 1 TABLET(40 MG) BY MOUTH DAILY   pravastatin (PRAVACHOL) 40 MG tablet TAKE 1 TABLET(40 MG) BY MOUTH DAILY     Objective:   PHYSICAL EXAMINATION:    VITALS:   Vitals:   12/07/21 1247  BP: 116/60  Pulse: 73  SpO2: 96%  Weight: 172 lb (78 kg)  Height: '5\' 8"'$  (1.727 m)     GEN:  The patient appears stated age and is in NAD. HEENT:  Normocephalic, atraumatic.  The mucous membranes are moist. The superficial temporal arteries are without ropiness or tenderness. CV:  RRR Lungs:  CTAB Neck/HEME:  There are no carotid bruits bilaterally.  Neurological examination:  Orientation: The patient is alert and oriented x3.  Looks to wife for finer aspects of the history. Cranial nerves: There is good facial symmetry with facial hypomimia. The speech is fluent and hypophonic.  Occasionally dysarthric.  Soft palate rises symmetrically and there is no tongue deviation. Hearing is decreased to conversational tone. Sensation: Sensation is intact to light touch throughout Motor: Strength is at least antigravity x4.  Movement examination: Tone: There is normal tone in the UE/LE Abnormal movements: none Coordination:  There is decremation with RAM's,  with any form of RAMS, including alternating supination and pronation of the forearm, hand opening and closing, finger taps, heel taps and toe taps, bilaterally, L slightly>R (slowness more than decremation) Gait and Station: The patient has difficulty arising out of a deep-seated chair without the use of the hands.  He pushes off to arise but he is very slow.  He is very short stepped and a bit unstable.    I have reviewed and interpreted the following labs  independently    Chemistry      Component Value Date/Time   NA 141 07/31/2021 1430   K 4.5 07/31/2021 1430   CL 106 07/31/2021 1430   CO2 22 07/31/2021 1430   BUN 18 07/31/2021 1430   CREATININE 1.13 07/31/2021 1430   CREATININE 1.15 09/21/2013 0719      Component Value Date/Time   CALCIUM 9.1 07/31/2021 1430   ALKPHOS 70 07/31/2021 1430   AST 10 07/31/2021 1430   ALT 9 07/31/2021 1430   BILITOT 0.3 07/31/2021 1430       Lab Results  Component Value Date   WBC 9.6 07/31/2021   HGB 13.5 07/31/2021   HCT 40.3 07/31/2021   MCV 94 07/31/2021   PLT 310 07/31/2021    Lab Results  Component Value Date   TSH 3.819 09/05/2014    Total time spent on today's visit was 31 minutes, including both face-to-face time and nonface-to-face time.  Time included that spent on review of records (prior notes available to me/labs/imaging if pertinent), discussing treatment and goals, answering patient's questions and coordinating care.    Cc:  Coral Spikes, DO

## 2021-11-29 ENCOUNTER — Other Ambulatory Visit: Payer: Self-pay | Admitting: *Deleted

## 2021-11-29 MED ORDER — ACCU-CHEK SOFTCLIX LANCETS MISC: 5 refills | Status: DC

## 2021-12-07 ENCOUNTER — Other Ambulatory Visit: Payer: Self-pay | Admitting: *Deleted

## 2021-12-07 ENCOUNTER — Ambulatory Visit: Payer: Medicare Other | Admitting: Neurology

## 2021-12-07 ENCOUNTER — Encounter: Payer: Self-pay | Admitting: Neurology

## 2021-12-07 DIAGNOSIS — K117 Disturbances of salivary secretion: Secondary | ICD-10-CM | POA: Diagnosis not present

## 2021-12-07 DIAGNOSIS — F0280 Dementia in other diseases classified elsewhere without behavioral disturbance: Secondary | ICD-10-CM | POA: Diagnosis not present

## 2021-12-07 DIAGNOSIS — G20 Parkinson's disease: Secondary | ICD-10-CM

## 2021-12-07 MED ORDER — PRAVASTATIN SODIUM 40 MG PO TABS: ORAL_TABLET | ORAL | 0 refills | Status: DC

## 2021-12-07 MED ORDER — CARBIDOPA-LEVODOPA 25-100 MG PO TABS
1.5000 | ORAL_TABLET | Freq: Three times a day (TID) | ORAL | 1 refills | Status: DC
Start: 2021-12-07 — End: 2022-06-12

## 2021-12-07 MED ORDER — LOSARTAN POTASSIUM 100 MG PO TABS: ORAL_TABLET | ORAL | 0 refills | Status: DC

## 2021-12-07 NOTE — Patient Instructions (Signed)
Take carbidopa/levodopa 25/100, 1.5 tablets at 7am/11am/4pm  Local and Online Resources for Power over Parkinson's Group August 2023  LOCAL [place] PARKINSON'S GROUPS  Power over Parkinson's Group:   Power Over Parkinson's Patient Education Group will be Wednesday, August 9th-*Hybrid meting*- in person at [facility] location and via [facility] at 2:00 pm.   Upcoming Power over [employer] Meetings:  2nd Wednesdays of the month at 2 pm:  August 9th, September 13th, October 11th Contact Amy Marriott at [email or web address] if interested in participating in this group Parkinson's Care Partners Group:    3rd Mondays, Contact Misty Paladino Atypical Parkinsonian Patient Group:   4th Wednesdays, [place] If you are interested in participating in these groups with Misty, please contact her directly for how to join those meetings.  Her contact information is [email or web address].    LOCAL EVENTS AND NEW OFFERINGS Parkinson's T-shirts for sale!  Designed by a local group member, with funds going to [employer]. X-Large sizes available.  Contact Misty to purchase  New PWR! Moves [employer] Instructor-Led Class offering at [employer]!  Wednesdays 1-2 pm, starting April 12th.   Contact Ronalee Belts Sabin at [employer], [email or web address]   [place]:  [email or web address] PD Health at Home continues:  Mindfulness Mondays, Wellness Wednesdays, Fitness Fridays  Upcoming Education:   Care Partners:  Why they Matter and why their Needs Matter.  Wednesday, August 9th 1-2 pm Invisible Symptoms:  NonMotor Symptoms.  Wednesday, August 16th 1-2 pm Expert Briefing:   Parkinson's Disease and the Bladder.  Wednesday, September 13th 1:00-2:00 pm Register for expert briefings (webinars) at September 26 Please check out their website to  sign up for emails and see their full online offerings   [place]:  [email or web address]  Third Thursday Webinars:  On the third Thursday of every month at 12 p.m. ET, join our free live webinars to learn about various aspects of living with Parkinson's disease and our work to speed medical breakthroughs. Upcoming Webinar: Too Much or Not Enough:  Dyskinesia and "off" time in Parkinson's (Replay).  Thursday, August 17th at 12 noon. Check out additional information on their website to see their full online offerings  [facility]:  [email or web address] Upcoming Webinar:   Stay tuned Webinar Series:  Living with Parkinson's Meetup.   Third Thursdays each month, 3 pm Care Partner Monthly Meetup.  With 05-31-1983 [place].  First Tuesday of each month, 2 pm Check out additional information to Live Well Today on their website  Parkinson and Movement Disorders (PMD) Alliance:  www.[email or web address] [employer] Online:  Online Education Events Sign up for emails, which are sent weekly to give you updates on programming and online offerings  Parkinson's Association of the [place]:  www.[email or web address] Information on online support groups, education events, and online exercises including Yoga, Parkinson's exercises and more-LOTS of information on links to PD resources and online events Virtual Support Group through Parkinson's Association of the [place]; next one is scheduled for Wednesday, September 6th at 2 pm. (These are typically scheduled for the 1st Wednesday of the month at 2 pm).  Visit website for details. Save the date for "Caring for Parkinson's-Caring for You", 9th Annual Symposium.  In-person event in [place].  September 9th.  More info on registration to come. MOVEMENT AND EXERCISE OPPORTUNITIES PWR! Moves Classes at [place].  Wednesdays 10 and 11 am.   Contact Amy Marriott, PT [email or web address] if  interested. NEW PWR! Moves Class offering at 01-14-1994.  Wednesdays 1-2 pm, starting April 12th.  Contact Caron Presume at [place], [email or web address] Here is a link to the PWR!Moves classes on Zoom from [place] - Daily Mon-Sat at 10:00. Via Zoom, FREE and open to all.  There is also a link below via Facebook if you use that platform.  [email or web address].si [email or web address]  Parkinson's Wellness Recovery (PWR! Moves)  www.[email or web address] Info on the PWR! Virtual Experience:  You will have access to our expertise through self-assessment, guided plans that start with the PD-specific fundamentals, educational content, tips, Q&A with an expert, and a growing Art therapist of PD-specific pre-recorded and live exercise classes of varying types and intensity - both physical and cognitive! If that is not enough, we offer 1:1 wellness consultations (in-person or virtual) to personalize your PWR! Research scientist (medical).  Scooba Fridays:  As part of the PD Health @ Home program, this free video series focuses each week on one aspect of fitness designed to support people living with Parkinson's.  These weekly videos highlight the [place] recent fitness guidelines for people with Parkinson's disease. ModemGamers.si Dance for PD website is offering free, live-stream classes throughout the week, as well as links to [employer] of classes:  https://[email or web address]/ Virtual dance and Pilates for Parkinson's classes: Click on the Community Tab> Parkinson's Movement Initiative Tab.  To register for classes and for more information, visit [email or web address] and click the "community" tab.  [employer] Parkinson's  Cycling Classes  [employer]:  Thursdays @ Noon-Live classes at [employer] ([employer] at [email or web address] or [phone]) [employer]: Virtual Classes Mondays and Thursdays Jeanette [name] classes Tuesday, Wednesday and Thursday (contact [place] at [email or web address]  or [phone]) [place] Varied levels of classes are offered Tuesdays and Thursdays at [facility].  Stretching with Verdis Frederickson weekly class is also offered for people with Parkinson's To observe a class or for more information, call [phone] or email Hezzie Bump at [email or web address] ADDITIONAL SUPPORT AND RESOURCES Well-Spring [employer]:Online Caregiver Education Opportunities:  www.well-springsolutions.org/caregiver-education/caregiver-support-group.  You may also contact Vickki Muff at [email or web address] or [phone].    Well-Spring Navigator:  [phone] program, a free service to help individuals and families through the journey of determining care for older adults.  The "Navigator" is a [employer], Education officer, museum, who will speak with a prospective client and/or loved ones to provide an assessment of the situation and a set of recommendations for a personalized care plan -- all free of charge, and whether Well[employer]Spring [employer] offers the needed service or not. If the need is not a service we provide, we are well-connected with reputable programs in town that we can refer you to.  www.well-springsolutions.org or to speak with the Navigator, call [phone].

## 2022-01-23 ENCOUNTER — Ambulatory Visit (INDEPENDENT_AMBULATORY_CARE_PROVIDER_SITE_OTHER): Payer: Medicare Other | Admitting: Family Medicine

## 2022-01-23 VITALS — BP 138/82 | HR 67 | Temp 98.7°F | Ht 68.0 in | Wt 171.0 lb

## 2022-01-23 DIAGNOSIS — I10 Essential (primary) hypertension: Secondary | ICD-10-CM | POA: Diagnosis not present

## 2022-01-23 DIAGNOSIS — E785 Hyperlipidemia, unspecified: Secondary | ICD-10-CM

## 2022-01-23 DIAGNOSIS — G20A1 Parkinson's disease without dyskinesia, without mention of fluctuations: Secondary | ICD-10-CM

## 2022-01-23 DIAGNOSIS — E119 Type 2 diabetes mellitus without complications: Secondary | ICD-10-CM

## 2022-01-23 NOTE — Assessment & Plan Note (Signed)
Seems to be doing well at this time.  Continue close follow-up with neurology.

## 2022-01-23 NOTE — Assessment & Plan Note (Signed)
Has been well controlled.  Continue statin.

## 2022-01-23 NOTE — Progress Notes (Signed)
Subjective:  Patient ID: Mark Brady, male    DOB: [birth year]  Age: 88 y.o. MRN: [id number]  CC: Chief Complaint  Patient presents with   6 Month folloow up     HPI:  88 year old male presents for follow-up.  Patient is overall doing well.  Following closely with neurology.  Seems to be doing fairly well from a Parkinson's perspective.  Sialorrhea has significantly improved.  Per neurology, he is having some occasional hallucinations.  We will need to monitor closely.  Hypertension is stable on losartan and amlodipine.  Type 2 diabetes has been well controlled.  Most recent A1c was 6.6 in April.  This is managed with diet alone.  Given advanced age, will monitor at least once a year.  Wife reports that he occasionally complains of left knee pain.    Patient Active Problem List   Diagnosis Date Noted   Parkinson's disease 07/24/2021   History of TIA (transient ischemic attack) 02/02/2021   Sialorrhea 05/14/2020   Gastroesophageal reflux disease without esophagitis 05/13/2020   Glaucoma 11/23/2019   Diabetes ([place]) 10/19/2012   Essential hypertension, benign 09/28/2012   Hyperlipidemia 09/28/2012    Social Hx   Social History   Socioeconomic History   Marital status: Married    Spouse name: Vaughan Basta   Number of children: 5   Years of education: Not on file   Highest education level: Not on file  Occupational History   Occupation: retired    Comment: tobacco and beef farmer  Tobacco Use   Smoking status: Never   Smokeless tobacco: Never  Substance and Sexual Activity   Alcohol use: No   Drug use: No   Sexual activity: Yes    Birth control/protection: None  Other Topics Concern   Not on file  Social History Narrative   3children, all live nearby.   Grandchildren and great grandchildren.      Right Handed    Social Determinants of Health   Financial Resource Strain: Low Risk  (01/10/2021)   Overall Financial Resource Strain (CARDIA)    Difficulty of  Paying Living Expenses: Not hard at all  Food Insecurity: No Food Insecurity (01/10/2021)   Hunger Vital Sign    Worried About Running Out of Food in the Last Year: Never true    Ran Out of Food in the Last Year: Never true  Transportation Needs: Unknown (01/10/2021)   PRAPARE - Hydrologist (Medical): No    Lack of Transportation (Non-Medical): Not on file  Physical Activity: Sufficiently Active (01/10/2021)   Exercise Vital Sign    Days of Exercise per Week: 5 days    Minutes of Exercise per Session: 30 min  Stress: No Stress Concern Present (01/10/2021)   [place]    Feeling of Stress : Not at all  Social Connections: [place] (01/10/2021)   Social Connection and Isolation Panel [NHANES]    Frequency of Communication with Friends and Family: More than three times a week    Frequency of Social Gatherings with Friends and Family: More than three times a week    Attends Religious Services: More than 4 times per year    Active Member of [employer] or Organizations: Yes    Attends Music therapist: More than 4 times per year    Marital Status: Married    Review of Systems Per HPI  Objective:  BP 138/82   Pulse 67  Temp 98.7 F (37.1 C)   Ht '5\' 8"'$  (1.727 m)   Wt 171 lb (77.6 kg)   SpO2 98%   BMI 26.00 kg/m      01/23/2022    9:53 AM 12/07/2021   12:47 PM 07/24/2021   10:03 AM  BP/Weight  Systolic BP 426 834 196  Diastolic BP 82 60 61  Wt. (Lbs) 171 172 170.6  BMI 26 kg/m2 26.15 kg/m2 25.94 kg/m2    Physical Exam  Lab Results  Component Value Date   WBC 9.6 07/31/2021   HGB 13.5 07/31/2021   HCT 40.3 07/31/2021   PLT 310 07/31/2021   GLUCOSE 165 (H) 07/31/2021   CHOL 163 07/31/2021   TRIG 162 (H) 07/31/2021   HDL 66 07/31/2021   LDLCALC 70 07/31/2021   ALT 9 07/31/2021   AST 10 07/31/2021   NA 141 07/31/2021   K 4.5 07/31/2021   CL 106  07/31/2021   CREATININE 1.13 07/31/2021   BUN 18 07/31/2021   CO2 22 07/31/2021   TSH 3.819 09/05/2014   INR 0.90 06/02/2010   HGBA1C 6.6 (H) 07/31/2021   MICROALBUR 10 01/31/2016     Assessment & Plan:   Problem List Items Addressed This Visit       Cardiovascular and Mediastinum   Essential hypertension, benign    Stable.  Continue amlodipine and losartan.        Endocrine   Diabetes ([place])    Diet controlled.  We will plan for A1c at next visit.        Nervous and Auditory   Parkinson's disease - Primary    Seems to be doing well at this time.  Continue close follow-up with neurology.        Other   Hyperlipidemia    Has been well controlled.  Continue statin.       Follow-up:  6 months.  [place]

## 2022-01-23 NOTE — Assessment & Plan Note (Signed)
Diet controlled.  We will plan for A1c at next visit.

## 2022-01-23 NOTE — Patient Instructions (Signed)
Continue your medications.  Follow up in 6 months.  Take care  Dr. Daeton Kluth  

## 2022-01-23 NOTE — Assessment & Plan Note (Signed)
Stable.  Continue amlodipine and losartan. 

## 2022-01-31 ENCOUNTER — Encounter: Payer: Self-pay | Admitting: *Deleted

## 2022-02-02 ENCOUNTER — Other Ambulatory Visit: Payer: Self-pay | Admitting: Family Medicine

## 2022-02-23 ENCOUNTER — Other Ambulatory Visit: Payer: Self-pay | Admitting: Family Medicine

## 2022-03-14 ENCOUNTER — Other Ambulatory Visit: Payer: Self-pay | Admitting: Family Medicine

## 2022-03-27 ENCOUNTER — Telehealth: Payer: Self-pay | Admitting: Family Medicine

## 2022-03-27 NOTE — Telephone Encounter (Signed)
Left message for patient to call back and schedule Medicare Annual Wellness Visit (AWV) in office.   If unable to come into the office for AWV,  please offer to do virtually or by telephone.  Last AWV: 01/10/2021   Please schedule at anytime with [facility]-Nurse Health Advisor.  30 minute appointment for Virtual or phone  45 minute appointment for in office or Initial virtual/phone  Any questions, please contact me at [phone]

## 2022-03-29 ENCOUNTER — Encounter (HOSPITAL_COMMUNITY): Payer: Self-pay

## 2022-03-29 ENCOUNTER — Emergency Department (HOSPITAL_COMMUNITY)
Admission: EM | Admit: 2022-03-29 | Discharge: 2022-03-29 | Disposition: A | Discharge status: Home / Self Care | Payer: Medicare Other | Attending: Emergency Medicine | Admitting: Emergency Medicine

## 2022-03-29 ENCOUNTER — Other Ambulatory Visit: Payer: Self-pay

## 2022-03-29 DIAGNOSIS — I10 Essential (primary) hypertension: Secondary | ICD-10-CM | POA: Insufficient documentation

## 2022-03-29 DIAGNOSIS — L509 Urticaria, unspecified: Secondary | ICD-10-CM | POA: Diagnosis not present

## 2022-03-29 DIAGNOSIS — E119 Type 2 diabetes mellitus without complications: Secondary | ICD-10-CM | POA: Insufficient documentation

## 2022-03-29 DIAGNOSIS — Z79899 Other long term (current) drug therapy: Secondary | ICD-10-CM | POA: Insufficient documentation

## 2022-03-29 DIAGNOSIS — M7989 Other specified soft tissue disorders: Secondary | ICD-10-CM | POA: Diagnosis not present

## 2022-03-29 DIAGNOSIS — R21 Rash and other nonspecific skin eruption: Secondary | ICD-10-CM | POA: Diagnosis present

## 2022-03-29 DIAGNOSIS — T7840XA Allergy, unspecified, initial encounter: Secondary | ICD-10-CM | POA: Insufficient documentation

## 2022-03-29 DIAGNOSIS — G20C Parkinsonism, unspecified: Secondary | ICD-10-CM | POA: Diagnosis not present

## 2022-03-29 DIAGNOSIS — D72829 Elevated white blood cell count, unspecified: Secondary | ICD-10-CM | POA: Diagnosis not present

## 2022-03-29 LAB — CBC
HCT: 38.2 % — ABNORMAL LOW (ref 39.0–52.0)
Hemoglobin: 13.1 g/dL (ref 13.0–17.0)
MCH: 32.3 pg (ref 26.0–34.0)
MCHC: 34.3 g/dL (ref 30.0–36.0)
MCV: 94.1 fL (ref 80.0–100.0)
Platelets: 289 10*3/uL (ref 150–400)
RBC: 4.06 MIL/uL — ABNORMAL LOW (ref 4.22–5.81)
RDW: 12.7 % (ref 11.5–15.5)
WBC: 15 10*3/uL — ABNORMAL HIGH (ref 4.0–10.5)
nRBC: 0 % (ref 0.0–0.2)

## 2022-03-29 LAB — BASIC METABOLIC PANEL
Anion gap: 7 (ref 5–15)
BUN: 27 mg/dL — ABNORMAL HIGH (ref 8–23)
CO2: 22 mmol/L (ref 22–32)
Calcium: 8.1 mg/dL — ABNORMAL LOW (ref 8.9–10.3)
Chloride: 107 mmol/L (ref 98–111)
Creatinine, Ser: 1.11 mg/dL (ref 0.61–1.24)
GFR, Estimated: >60 mL/min (ref >60)
Glucose, Bld: 155 mg/dL — ABNORMAL HIGH (ref 70–99)
Potassium: 3.7 mmol/L (ref 3.5–5.1)
Sodium: 136 mmol/L (ref 135–145)

## 2022-03-29 MED ORDER — DIPHENHYDRAMINE HCL 50 MG/ML IJ SOLN
25.0000 mg | Freq: Once | INTRAMUSCULAR | Status: AC
  Administered 2022-03-29: 25 mg via INTRAVENOUS
  Filled 2022-03-29: qty 1

## 2022-03-29 MED ORDER — PREDNISONE 50 MG PO TABS: 50.0000 mg | ORAL_TABLET | Freq: Every day | ORAL | 0 refills | Status: DC

## 2022-03-29 MED ORDER — FAMOTIDINE IN NACL 20-0.9 MG/50ML-% IV SOLN
20.0000 mg | Freq: Once | INTRAVENOUS | Status: AC
  Administered 2022-03-29: 20 mg via INTRAVENOUS
  Filled 2022-03-29: qty 50

## 2022-03-29 MED ORDER — DEXAMETHASONE SODIUM PHOSPHATE 10 MG/ML IJ SOLN
10.0000 mg | Freq: Once | INTRAMUSCULAR | Status: AC
  Administered 2022-03-29: 10 mg via INTRAVENOUS
  Filled 2022-03-29: qty 1

## 2022-03-29 NOTE — ED Triage Notes (Addendum)
Pt arrived from home via POV w c/o red, swollen, itchy,  bilateral hands and forearms that started yesterday. Began with itching, then a rash per family looked like hives and has now progressed to erythema from finger tips to mid forearm bilaterally. Pt states that it still itches terribly and hurts 7/10 on pain scale. Rash is scattered through out body. Took benadryl last night, this morning, and afternoon.

## 2022-03-29 NOTE — ED Provider Notes (Signed)
[facility] EMERGENCY DEPARTMENT Provider Note   CSN: [id number] Arrival date & time: 03/29/22  2010     History  Chief Complaint  Patient presents with   Rash   swollen hands   red swollen forearms    Mark Brady is a 88 y.o. male.  Pt is a 88 yo male with a pmhx significant for htn, arthritis, hld, cva, parkinson's and dm.  Pt developed hives yesterday.  He was given benadryl and hydrocortisone crm.  This am, pt woke up with swollen hands and the hives have coalesced.  Pt has not been exposed to anything new.  He has itchiness all over.         Home Medications Prior to Admission medications   Medication Sig Start Date End Date Taking? Authorizing Provider  predniSONE (DELTASONE) 50 MG tablet Take 1 tablet (50 mg total) by mouth daily with breakfast. 03/29/22  Yes Isla Pence, MD  Accu-Chek Softclix Lancets lancets USE AS DIRECTED ONCE DAILY 11/29/21   Thersa Salt G, DO  amLODipine (NORVASC) 10 MG tablet TAKE 1 TABLET(10 MG) BY MOUTH DAILY 02/02/22   Cook, Jayce G, DO  carbidopa-levodopa (SINEMET IR) 25-100 MG tablet Take 1.5 tablets by mouth 3 (three) times daily. 12/07/21   Tat, Eustace Quail, DO  Cholecalciferol (VITAMIN D-3) 125 MCG (5000 UT) TABS Take by mouth.    [provider]  dorzolamide-timolol (COSOPT) 22.3-6.8 MG/ML ophthalmic solution Place 1 drop into both eyes daily.    [provider]  famotidine (PEPCID) 40 MG tablet TAKE 1 TABLET(40 MG) BY MOUTH DAILY 02/23/22   Cook, Deercroft G, DO  glucose blood (ACCU-CHEK AVIVA PLUS) test strip USE TO CHECK BLOOD SUGAR DAILY 12/12/20   Lovena Le, Malena M, DO  losartan (COZAAR) 100 MG tablet TAKE 1 TABLET(100 MG) BY MOUTH DAILY 03/15/22   Thersa Salt G, DO  mupirocin ointment (BACTROBAN) 2 % Apply 1 application topically daily as needed. Patient not taking: Reported on 01/23/2022 02/02/21   Coral Spikes, DO  pantoprazole (PROTONIX) 40 MG tablet TAKE 1 TABLET(40 MG) BY MOUTH DAILY 11/22/21   Thersa Salt G,  DO  pravastatin (PRAVACHOL) 40 MG tablet TAKE 1 TABLET(40 MG) BY MOUTH DAILY 03/15/22   Coral Spikes, DO      Allergies    Penicillins and Aspirin    Review of Systems   Review of Systems  Skin:  Positive for rash.  All other systems reviewed and are negative.   Physical Exam Updated Vital Signs BP 125/64   Pulse 79   Temp 98.5 F (36.9 C) (Oral)   Resp 18   SpO2 94%  Physical Exam Vitals and nursing note reviewed.  Constitutional:      Appearance: Normal appearance.  HENT:     Head: Normocephalic and atraumatic.     Right Ear: External ear normal.     Left Ear: External ear normal.     Nose: Nose normal.     Mouth/Throat:     Mouth: Mucous membranes are moist.     Pharynx: Oropharynx is clear.  Eyes:     Extraocular Movements: Extraocular movements intact.     Conjunctiva/sclera: Conjunctivae normal.     Pupils: Pupils are equal, round, and reactive to light.  Cardiovascular:     Rate and Rhythm: Normal rate and regular rhythm.     Pulses: Normal pulses.     Heart sounds: Normal heart sounds.  Pulmonary:     Effort: Pulmonary effort is normal.  Breath sounds: Normal breath sounds.  Abdominal:     General: Abdomen is flat. Bowel sounds are normal.     Palpations: Abdomen is soft.  Musculoskeletal:        General: Normal range of motion.     Cervical back: Normal range of motion and neck supple.  Skin:    Capillary Refill: Capillary refill takes less than 2 seconds.     Findings: Rash present. Rash is urticarial.  Neurological:     General: No focal deficit present.     Mental Status: He is alert and oriented to person, place, and time.  Psychiatric:        Mood and Affect: Mood normal.        Behavior: Behavior normal.     ED Results / Procedures / Treatments   Labs (all labs ordered are listed, but only abnormal results are displayed) Labs Reviewed  CBC - Abnormal; Notable for the following components:      Result Value   WBC 15.0 (*)    RBC  4.06 (*)    HCT 38.2 (*)    All other components within normal limits  BASIC METABOLIC PANEL - Abnormal; Notable for the following components:   Glucose, Bld 155 (*)    BUN 27 (*)    Calcium 8.1 (*)    All other components within normal limits    EKG None  Radiology No results found.  Procedures Procedures    Medications Ordered in ED Medications  diphenhydrAMINE (BENADRYL) injection 25 mg (25 mg Intravenous Given 03/29/22 2149)  dexamethasone (DECADRON) injection 10 mg (10 mg Intravenous Given 03/29/22 2149)  famotidine (PEPCID) IVPB 20 mg premix (0 mg Intravenous Stopped 03/29/22 2220)    ED Course/ Medical Decision Making/ A&P                           Medical Decision Making Amount and/or Complexity of Data Reviewed Labs: ordered.  Risk Prescription drug management.   This patient presents to the ED for concern of hives, this involves an extensive number of treatment options, and is a complaint that carries with it a high risk of complications and morbidity.  The differential diagnosis includes allergic rxn   Co morbidities that complicate the patient evaluation  htn, arthritis, hld, cva, parkinson's and dm   Additional history obtained:  Additional history obtained from epic chart review External records from outside source obtained and reviewed including family   Lab Tests:  I Ordered, and personally interpreted labs.  The pertinent results include:  cbc with wbc elevated at 15; bmp with glucose elevated at 155   Cardiac Monitoring:  The patient was maintained on a cardiac monitor.  I personally viewed and interpreted the cardiac monitored which showed an underlying rhythm of: nsr   Medicines ordered and prescription drug management:  I ordered medication including benadryl, decadron, and pepcid  for allergic rxn  Reevaluation of the patient after these medicines showed that the patient improved I have reviewed the patients home medicines and  have made adjustments as needed  Problem List / ED Course:  Allergic rxn:  pt feels much better after meds.  He is stable for d/c.  Return if worse.  F/u with pcp.   Reevaluation:  After the interventions noted above, I reevaluated the patient and found that they have :improved   Social Determinants of Health:  Lives at home with family   Dispostion:  After consideration  of the diagnostic results and the patients response to treatment, I feel that the patent would benefit from discharge with outpatient f/u.          Final Clinical Impression(s) / ED Diagnoses Final diagnoses:  Hives  Allergic reaction, initial encounter    Rx / DC Orders ED Discharge Orders          Ordered    predniSONE (DELTASONE) 50 MG tablet  Daily with breakfast        03/29/22 2234              Isla Pence, MD 03/29/22 2236

## 2022-03-29 NOTE — ED Notes (Signed)
Pt's bilateral hands and forearms are red, swollen, and hot to touch. Pt denies coming into contact with anything new. Pt has redness and bumps/welps in random places across body including chin and buttocks. Pt complains of itching. Denies areas being painful

## 2022-03-29 NOTE — Discharge Instructions (Signed)
Continue giving benadryl every 4-6 hours for hives.  Wash clothes/bedding in detergent free of perfumes/dyes.  Eucerin cream for skin.

## 2022-03-30 ENCOUNTER — Telehealth: Payer: Self-pay

## 2022-03-30 NOTE — Telephone Encounter (Signed)
Transition Care Management Follow-up Telephone Call Date of discharge and from where: 12/14; [facility] How have you been since you were released from the hospital? Stable, problem persists Any questions or concerns? No  Items Reviewed: Did the pt receive and understand the discharge instructions provided? Yes  Medications obtained and verified? No ; wife plans to pick up today Other?  N/a  Any new allergies since your discharge? No  Dietary orders reviewed? Yes Do you have support at home? Yes   Follow up appointments reviewed:  PCP Hospital f/u appt confirmed? Yes  Scheduled to see Dr. Lacinda Axon on 04/06/20 @ 9:20. Are transportation arrangements needed? No  If their condition worsens, is the pt aware to call PCP or go to the Emergency Dept.? Yes Was the patient provided with contact information for the PCP's office or ED? Yes Was to pt encouraged to call back with questions or concerns? Yes

## 2022-04-06 ENCOUNTER — Encounter: Payer: Self-pay | Admitting: Family Medicine

## 2022-04-06 ENCOUNTER — Ambulatory Visit (INDEPENDENT_AMBULATORY_CARE_PROVIDER_SITE_OTHER): Payer: Medicare Other | Admitting: Family Medicine

## 2022-04-06 VITALS — BP 114/71 | HR 74 | Temp 99.0°F | Wt 173.0 lb

## 2022-04-06 DIAGNOSIS — L509 Urticaria, unspecified: Secondary | ICD-10-CM | POA: Insufficient documentation

## 2022-04-06 MED ORDER — CETIRIZINE HCL 5 MG PO TABS: 5.0000 mg | ORAL_TABLET | Freq: Every day | ORAL | 0 refills | Status: DC

## 2022-04-06 NOTE — Patient Instructions (Addendum)
Zyrtec 5 mg daily.  If this recurs, please let me know.  Take care  Dr. Lacinda Axon

## 2022-04-06 NOTE — Assessment & Plan Note (Signed)
Has resolved.  Recommended antihistamine over the next month.  Rx sent for Zyrtec.

## 2022-04-06 NOTE — Progress Notes (Signed)
Subjective:  Patient ID: Mark Brady, male    DOB: 04-01-1930  Age: 88 y.o. MRN: [id number]  CC: Chief Complaint  Patient presents with   ER Follow Up    Pt went to [facility] ED on 03/29/22 for Hives and Hand Swelling. Unsure of cause. Pt wife states the only thing that has changed was hand soap. Pt has improved at this time.     HPI:  88 year old male with the below mentioned past medical history presents for ER follow-up.  Recently seen in the ER on 12/14.  Diagnosed with urticaria/allergic reaction.  Was placed on prednisone.  Patient is accompanied by his wife today.  Patient and his wife report that the hives have essentially resolved.  No more associated swelling.  He has no other complaints or concerns at this time.  He has completed his course of prednisone.  Patient Active Problem List   Diagnosis Date Noted   Urticaria 04/06/2022   Parkinson's disease 07/24/2021   History of TIA (transient ischemic attack) 02/02/2021   Sialorrhea 05/14/2020   Gastroesophageal reflux disease without esophagitis 05/13/2020   Glaucoma 11/23/2019   Diabetes ([place]) 10/19/2012   Essential hypertension, benign 09/28/2012   Hyperlipidemia 09/28/2012    Social Hx   Social History   Socioeconomic History   Marital status: Married    Spouse name: Vaughan Basta   Number of children: 5   Years of education: Not on file   Highest education level: Not on file  Occupational History   Occupation: retired    Comment: tobacco and beef farmer  Tobacco Use   Smoking status: Never   Smokeless tobacco: Never  Substance and Sexual Activity   Alcohol use: No   Drug use: No   Sexual activity: Yes    Birth control/protection: None  Other Topics Concern   Not on file  Social History Narrative   3children, all live nearby.   Grandchildren and great grandchildren.      Right Handed    Social Determinants of Health   Financial Resource Strain: Low Risk  (01/10/2021)   Overall Financial Resource  Strain (CARDIA)    Difficulty of Paying Living Expenses: Not hard at all  Food Insecurity: No Food Insecurity (01/10/2021)   Hunger Vital Sign    Worried About Running Out of Food in the Last Year: Never true    Ran Out of Food in the Last Year: Never true  Transportation Needs: Unknown (01/10/2021)   PRAPARE - Hydrologist (Medical): No    Lack of Transportation (Non-Medical): Not on file  Physical Activity: Sufficiently Active (01/10/2021)   Exercise Vital Sign    Days of Exercise per Week: 5 days    Minutes of Exercise per Session: 30 min  Stress: No Stress Concern Present (01/10/2021)   [place]    Feeling of Stress : Not at all  Social Connections: [place] (01/10/2021)   Social Connection and Isolation Panel [NHANES]    Frequency of Communication with Friends and Family: More than three times a week    Frequency of Social Gatherings with Friends and Family: More than three times a week    Attends Religious Services: More than 4 times per year    Active Member of [employer] or Organizations: Yes    Attends Music therapist: More than 4 times per year    Marital Status: Married    Review of Systems  Per HPI  Objective:  BP 114/71   Pulse 74   Temp 99 F (37.2 C)   Wt 173 lb (78.5 kg)   SpO2 93%   BMI 26.30 kg/m      04/06/2022    9:24 AM 03/29/2022   10:47 PM 03/29/2022   10:30 PM  BP/Weight  Systolic BP 778 242 353  Diastolic BP 71 64 64  Wt. (Lbs) 173    BMI 26.3 kg/m2      Physical Exam Vitals and nursing note reviewed.  Constitutional:      General: He is not in acute distress. HENT:     Head: Normocephalic and atraumatic.  Cardiovascular:     Rate and Rhythm: Normal rate and regular rhythm.  Pulmonary:     Effort: Pulmonary effort is normal.     Breath sounds: Normal breath sounds. No wheezing, rhonchi or rales.  Skin:    Comments: No  appreciable hives or swelling.  Neurological:     Mental Status: He is alert.     Lab Results  Component Value Date   WBC 15.0 (H) 03/29/2022   HGB 13.1 03/29/2022   HCT 38.2 (L) 03/29/2022   PLT 289 03/29/2022   GLUCOSE 155 (H) 03/29/2022   CHOL 163 07/31/2021   TRIG 162 (H) 07/31/2021   HDL 66 07/31/2021   LDLCALC 70 07/31/2021   ALT 9 07/31/2021   AST 10 07/31/2021   NA 136 03/29/2022   K 3.7 03/29/2022   CL 107 03/29/2022   CREATININE 1.11 03/29/2022   BUN 27 (H) 03/29/2022   CO2 22 03/29/2022   TSH 3.819 09/05/2014   INR 0.90 06/02/2010   HGBA1C 6.6 (H) 07/31/2021   MICROALBUR 10 01/31/2016     Assessment & Plan:   Problem List Items Addressed This Visit       Musculoskeletal and Integument   Urticaria - Primary    Has resolved.  Recommended antihistamine over the next month.  Rx sent for Zyrtec.       Meds ordered this encounter  Medications   cetirizine (ZYRTEC) 5 MG tablet    Sig: Take 1 tablet (5 mg total) by mouth daily.    Dispense:  30 tablet    Refill:  0    Follow-up: Has scheduled follow-up  [place]

## 2022-05-11 ENCOUNTER — Ambulatory Visit (INDEPENDENT_AMBULATORY_CARE_PROVIDER_SITE_OTHER): Payer: Medicare Other

## 2022-05-11 VITALS — BP 128/62 | Ht 68.0 in | Wt 175.0 lb

## 2022-05-11 DIAGNOSIS — Z0000 Encounter for general adult medical examination without abnormal findings: Secondary | ICD-10-CM | POA: Diagnosis not present

## 2022-05-11 NOTE — Progress Notes (Signed)
Subjective:   Mark Brady is a 89 y.o. male who presents for Medicare Annual/Subsequent preventive examination.  Review of Systems     Cardiac Risk Factors include: advanced age (>53mn, >>52women);male gender;hypertension     Objective:    Today's Vitals   05/11/22 1446  BP: 128/62  Weight: 175 lb (79.4 kg)  Height: '5\' 8"'$  (1.727 m)   Body mass index is 26.61 kg/m.     05/11/2022    2:54 PM 03/29/2022    8:53 PM 12/07/2021   12:53 PM 05/15/2021    1:52 PM 02/23/2021    6:21 PM 01/10/2021    4:08 PM 08/24/2020    1:44 PM  Advanced Directives  Does Patient Have a Medical Advance Directive? Yes No No Yes No No No  Type of Advance Directive Living will;Healthcare Power of Attorney   Living will     Does patient want to make changes to medical advance directive? No - Patient declined        Copy of [place] Chart? Yes - validated most recent copy scanned in chart (See row information)        Would patient like information on creating a medical advance directive?  No - Patient declined    No - Patient declined     Current Medications (verified) Outpatient Encounter Medications as of 05/11/2022  Medication Sig   Accu-Chek Softclix Lancets lancets USE AS DIRECTED ONCE DAILY   amLODipine (NORVASC) 10 MG tablet TAKE 1 TABLET(10 MG) BY MOUTH DAILY   carbidopa-levodopa (SINEMET IR) 25-100 MG tablet Take 1.5 tablets by mouth 3 (three) times daily.   cetirizine (ZYRTEC) 5 MG tablet Take 1 tablet (5 mg total) by mouth daily.   Cholecalciferol (VITAMIN D-3) 125 MCG (5000 UT) TABS Take by mouth.   dorzolamide-timolol (COSOPT) 22.3-6.8 MG/ML ophthalmic solution Place 1 drop into both eyes daily.   famotidine (PEPCID) 40 MG tablet TAKE 1 TABLET(40 MG) BY MOUTH DAILY   glucose blood (ACCU-CHEK AVIVA PLUS) test strip USE TO CHECK BLOOD SUGAR DAILY   latanoprost (XALATAN) 0.005 % ophthalmic solution SMARTSIG:1 Drop(s) In Eye(s) Every Evening   losartan (COZAAR)  100 MG tablet TAKE 1 TABLET(100 MG) BY MOUTH DAILY   mupirocin ointment (BACTROBAN) 2 % Apply 1 application topically daily as needed.   pantoprazole (PROTONIX) 40 MG tablet TAKE 1 TABLET(40 MG) BY MOUTH DAILY   pravastatin (PRAVACHOL) 40 MG tablet TAKE 1 TABLET(40 MG) BY MOUTH DAILY   No facility-administered encounter medications on file as of 05/11/2022.    Allergies (verified) Penicillins and Aspirin   History: Past Medical History:  Diagnosis Date   Arthritis    Diabetes mellitus without complication (HCC)    diet controlled   History of elevated PSA    Hyperlipidemia    Hypertension    Stroke ([facility]    Past Surgical History:  Procedure Laterality Date   CATARACT EXTRACTION W/PHACO Right 01/26/2014   Procedure: CATARACT EXTRACTION PHACO AND INTRAOCULAR LENS PLACEMENT ([place];  Surgeon: MElta GuadeloupeT. SGershon Crane MD;  Location: [facility];  Service: Ophthalmology;  Laterality: Right;  CDE 6.53   CATARACT EXTRACTION W/PHACO Left 02/09/2014   Procedure: CATARACT EXTRACTION PHACO AND INTRAOCULAR LENS PLACEMENT ([facility]);  Surgeon: MElta GuadeloupeT. SGershon Crane MD;  Location: [facility];  Service: Ophthalmology;  Laterality: Left;  CDE:5.58   COLONOSCOPY     YAG LASER APPLICATION Right 87/03/4579  Procedure: YAG LASER APPLICATION;  Surgeon: MRutherford Guys MD;  Location: [facility];  Service: Ophthalmology;  Laterality: Right;   Family History  Problem Relation Age of Onset   Arthritis Other    Colon cancer Child    Heart failure Child    Social History   Socioeconomic History   Marital status: Married    Spouse name: Vaughan Basta   Number of children: 5   Years of education: Not on file   Highest education level: Not on file  Occupational History   Occupation: retired    Comment: tobacco and beef farmer  Tobacco Use   Smoking status: Never   Smokeless tobacco: Never  Substance and Sexual Activity   Alcohol use: No   Drug use: No   Sexual activity: Yes    Birth control/protection: None  Other Topics Concern    Not on file  Social History Narrative   3children, all live nearby.   Grandchildren and great grandchildren.      Right Handed    Social Determinants of Health   Financial Resource Strain: Low Risk  (05/11/2022)   Overall Financial Resource Strain (CARDIA)    Difficulty of Paying Living Expenses: Not hard at all  Food Insecurity: No Food Insecurity (05/11/2022)   Hunger Vital Sign    Worried About Running Out of Food in the Last Year: Never true    Ran Out of Food in the Last Year: Never true  Transportation Needs: No Transportation Needs (05/11/2022)   PRAPARE - Hydrologist (Medical): No    Lack of Transportation (Non-Medical): No  Physical Activity: Insufficiently Active (05/11/2022)   Exercise Vital Sign    Days of Exercise per Week: 3 days    Minutes of Exercise per Session: 30 min  Stress: No Stress Concern Present (05/11/2022)   [place]    Feeling of Stress : Not at all  Social Connections: Moderately Integrated (05/11/2022)   Social Connection and Isolation Panel [NHANES]    Frequency of Communication with Friends and Family: More than three times a week    Frequency of Social Gatherings with Friends and Family: Three times a week    Attends Religious Services: More than 4 times per year    Active Member of Clubs or Organizations: No    Attends Archivist Meetings: Never    Marital Status: Married    Tobacco Counseling Counseling given: Not Answered   Clinical Intake:  Pre-visit preparation completed: Yes  Pain : No/denies pain     Diabetes: Yes CBG done?: No Did pt. bring in CBG monitor from home?: No  How often do you need to have someone help you when you read instructions, pamphlets, or other written materials from your doctor or pharmacy?: 1 - Never  Diabetic?Yes Nutrition Risk Assessment:  Has the patient had any N/V/D within the last 2 months?   No  Does the patient have any non-healing wounds?  No  Has the patient had any unintentional weight loss or weight gain?  No   Diabetes:  Is the patient diabetic?  Yes  If diabetic, was a CBG obtained today?  No  Did the patient bring in their glucometer from home?  No  How often do you monitor your CBG's? As needed .   Financial Strains and Diabetes Management:  Are you having any financial strains with the device, your supplies or your medication? No .  Does the patient want to be seen by Chronic Care Management for management of their  diabetes?  No  Would the patient like to be referred to a Nutritionist or for Diabetic Management?  Yes   Diabetic Exams:  Diabetic Eye Exam: Completed 12/15/21 Diabetic Foot Exam: Completed at next office visit    Interpreter Needed?: No  Information entered by :: Denman George LPN   Activities of Daily Living    05/11/2022    2:54 PM  In your present state of health, do you have any difficulty performing the following activities:  Hearing? 0  Vision? 0  Difficulty concentrating or making decisions? 1  Walking or climbing stairs? 1  Dressing or bathing? 0  Doing errands, shopping? 1  Preparing Food and eating ? N  Using the Toilet? N  In the past six months, have you accidently leaked urine? N  Do you have problems with loss of bowel control? N  Managing your Medications? Y  Managing your Finances? Y  Housekeeping or managing your Housekeeping? Y    Patient Care Team: Coral Spikes, DO as PCP - General (Family Medicine) Tat, Eustace Quail, DO as Consulting Physician (Neurology) [facility] (Ophthalmology)  Indicate any recent Medical Services you may have received from other than [facility] providers in the past year (date may be approximate).     Assessment:   This is a routine wellness examination for Mark Brady.  Hearing/Vision screen Hearing Screening - Comments:: Denies hearing difficulties  Vision Screening -  Comments::  up to date with routine eye exams with [place] issues and exercise activities discussed: Current Exercise Habits: Home exercise routine, Type of exercise: walking, Time (Minutes): 30, Frequency (Times/Week): 3, Weekly Exercise (Minutes/Week): 90, Intensity: Mild   Goals Addressed             This Visit's Progress    Have 3 meals a day   On track    Continue with healthy diet. Live to be 89.       Depression Screen    05/11/2022    2:52 PM 04/06/2022    9:24 AM 01/23/2022    9:59 AM 02/02/2021    1:23 PM 02/02/2021    1:13 PM 01/10/2021    4:01 PM 10/28/2020   10:39 AM  PHQ 2/9 Scores  PHQ - 2 Score 0 0 0 0 0 0 0    Fall Risk    05/11/2022    2:51 PM 04/06/2022    9:24 AM 01/23/2022    9:59 AM 12/07/2021   12:53 PM 07/24/2021   10:03 AM  Fall Risk   Falls in the past year? 0 0 0 0 0  Number falls in past yr: 0 0 0 0 0  Injury with Fall? 0 0 0 0 0  Risk for fall due to :  No Fall Risks No Fall Risks  No Fall Risks  Follow up Falls prevention discussed;Education provided;Falls evaluation completed Falls evaluation completed Falls evaluation completed  Falls evaluation completed    FALL RISK PREVENTION PERTAINING TO THE HOME:  Any stairs in or around the home? No  If so, are there any without handrails? No  Home free of loose throw rugs in walkways, pet beds, electrical cords, etc? Yes  Adequate lighting in your home to reduce risk of falls? Yes   ASSISTIVE DEVICES UTILIZED TO PREVENT FALLS:  Life alert? No  Use of a cane, walker or w/c? No  Grab bars in the bathroom? Yes  Shower chair or bench in shower? No  Elevated toilet  seat or a handicapped toilet? Yes   TIMED UP AND GO:  Was the test performed? Yes .  Length of time to ambulate 10 feet: 7 sec.   Gait slow and steady without use of assistive device  Cognitive Function:        05/11/2022    2:55 PM  6CIT Screen  What Year? 0 points  What month? 0 points  What time?  0 points  Count back from 20 0 points  Months in reverse 2 points  Repeat phrase 4 points  Total Score 6 points    Immunizations Immunization History  Administered Date(s) Administered   Fluad Quad(high Dose 65+) 02/02/2021   Influenza,inj,Quad PF,6+ Mos 02/07/2016, 02/06/2017, 02/03/2018, 01/10/2019   Influenza-Unspecified 03/08/2020   Moderna Sars-Covid-2 Vaccination 06/12/2019, 07/03/2019   Pneumococcal Conjugate-13 08/02/2014   Pneumococcal Polysaccharide-23 12/16/2011   Td 08/09/2008    TDAP status: Due, Education has been provided regarding the importance of this vaccine. Advised may receive this vaccine at local pharmacy or Health Dept. Aware to provide a copy of the vaccination record if obtained from local pharmacy or Health Dept. Verbalized acceptance and understanding.  Flu Vaccine status: Declined, Education has been provided regarding the importance of this vaccine but patient still declined. Advised may receive this vaccine at local pharmacy or Health Dept. Aware to provide a copy of the vaccination record if obtained from local pharmacy or Health Dept. Verbalized acceptance and understanding.  Pneumococcal vaccine status: Up to date  Covid-19 vaccine status: Information provided on how to obtain vaccines.   Qualifies for Shingles Vaccine? Yes   Zostavax completed No   Shingrix Completed?: No.    Education has been provided regarding the importance of this vaccine. Patient has been advised to call insurance company to determine out of pocket expense if they have not yet received this vaccine. Advised may also receive vaccine at local pharmacy or Health Dept. Verbalized acceptance and understanding.  Screening Tests Health Maintenance  Topic Date Due   DTaP/Tdap/Td (2 - Tdap) 08/10/2018   FOOT EXAM  05/13/2021   HEMOGLOBIN A1C  01/30/2022   COVID-19 Vaccine (3 - Moderna risk series) 05/27/2022 (Originally 07/31/2019)   INFLUENZA VACCINE  07/15/2022 (Originally  11/14/2021)   OPHTHALMOLOGY EXAM  12/16/2022   Medicare Annual Wellness (AWV)  05/12/2023   Pneumonia Vaccine 78+ Years old  Completed   HPV VACCINES  Aged Out   Zoster Vaccines- Shingrix  Discontinued    Health Maintenance  Health Maintenance Due  Topic Date Due   DTaP/Tdap/Td (2 - Tdap) 08/10/2018   FOOT EXAM  05/13/2021   HEMOGLOBIN A1C  01/30/2022    Colorectal cancer screening: No longer required.   Lung Cancer Screening: (Low Dose CT Chest recommended if Age 35-80 years, 30 pack-year currently smoking OR have quit w/in 15years.) does not qualify.   Lung Cancer Screening Referral: n/a   Additional Screening:  Hepatitis C Screening: does not qualify  Vision Screening: Recommended annual ophthalmology exams for early detection of glaucoma and other disorders of the eye. Is the patient up to date with their annual eye exam?  Yes  Who is the provider or what is the name of the office in which the patient attends annual eye exams? [facility]  If pt is not established with a provider, would they like to be referred to a provider to establish care? No .   Dental Screening: Recommended annual dental exams for proper oral hygiene  Community Resource Referral / Chronic  Care Management: CRR required this visit?  No   CCM required this visit?  No      Plan:     I have personally reviewed and noted the following in the patient's chart:   Medical and social history Use of alcohol, tobacco or illicit drugs  Current medications and supplements including opioid prescriptions. Patient is not currently taking opioid prescriptions. Functional ability and status Nutritional status Physical activity Advanced directives List of other physicians Hospitalizations, surgeries, and ER visits in previous 12 months Vitals Screenings to include cognitive, depression, and falls Referrals and appointments  In addition, I have reviewed and discussed with patient certain  preventive protocols, quality metrics, and best practice recommendations. A written personalized care plan for preventive services as well as general preventive health recommendations were provided to patient.     Denman George [place], [place]   [id number]/[id number]   Nurse Notes: No concerns

## 2022-05-11 NOTE — Patient Instructions (Addendum)
Mark Brady , Thank you for taking time to come for your Medicare Wellness Visit. I appreciate your ongoing commitment to your health goals. Please review the following plan we discussed and let me know if I can assist you in the future.   These are the goals we discussed:  Goals      Have 3 meals a day     Continue with healthy diet. Live to be 89.        This is a list of the screening recommended for you and due dates:  Health Maintenance  Topic Date Due   DTaP/Tdap/Td vaccine (2 - Tdap) 08/10/2018   Complete foot exam   05/13/2021   Hemoglobin A1C  01/30/2022   COVID-19 Vaccine (3 - Moderna risk series) 05/27/2022*   Flu Shot  07/15/2022*   Eye exam for diabetics  12/16/2022   Medicare Annual Wellness Visit  05/12/2023   Pneumonia Vaccine  Completed   HPV Vaccine  Aged Out   Zoster (Shingles) Vaccine  Discontinued  *Topic was postponed. The date shown is not the original due date.    Advanced directives: We have a copy of your advanced directives available in your record should your provider ever need to access them.   Conditions/risks identified: Aim for 30 minutes of exercise or brisk walking, 6-8 glasses of water, and 5 servings of fruits and vegetables each day.   Next appointment: Follow up in one year for your annual wellness visit.   Preventive Care 27 Years and Older, Male  Preventive care refers to lifestyle choices and visits with your health care provider that can promote health and wellness. What does preventive care include? A yearly physical exam. This is also called an annual well check. Dental exams once or twice a year. Routine eye exams. Ask your health care provider how often you should have your eyes checked. Personal lifestyle choices, including: Daily care of your teeth and gums. Regular physical activity. Eating a healthy diet. Avoiding tobacco and drug use. Limiting alcohol use. Practicing safe sex. Taking low doses of aspirin every  day. Taking vitamin and mineral supplements as recommended by your health care provider. What happens during an annual well check? The services and screenings done by your health care provider during your annual well check will depend on your age, overall health, lifestyle risk factors, and family history of disease. Counseling  Your health care provider may ask you questions about your: Alcohol use. Tobacco use. Drug use. Emotional well-being. Home and relationship well-being. Sexual activity. Eating habits. History of falls. Memory and ability to understand (cognition). Work and work Statistician. Screening  You may have the following tests or measurements: Height, weight, and BMI. Blood pressure. Lipid and cholesterol levels. These may be checked every 5 years, or more frequently if you are over 41 years old. Skin check. Lung cancer screening. You may have this screening every year starting at age 26 if you have a 30-pack-year history of smoking and currently smoke or have quit within the past 15 years. Fecal occult blood test (FOBT) of the stool. You may have this test every year starting at age 42. Flexible sigmoidoscopy or colonoscopy. You may have a sigmoidoscopy every 5 years or a colonoscopy every 10 years starting at age 16. Prostate cancer screening. Recommendations will vary depending on your family history and other risks. Hepatitis C blood test. Hepatitis B blood test. Sexually transmitted disease (STD) testing. Diabetes screening. This is done by checking your blood sugar (  glucose) after you have not eaten for a while (fasting). You may have this done every 1-3 years. Abdominal aortic aneurysm (AAA) screening. You may need this if you are a current or former smoker. Osteoporosis. You may be screened starting at age 32 if you are at high risk. Talk with your health care provider about your test results, treatment options, and if necessary, the need for more  tests. Vaccines  Your health care provider may recommend certain vaccines, such as: Influenza vaccine. This is recommended every year. Tetanus, diphtheria, and acellular pertussis (Tdap, Td) vaccine. You may need a Td booster every 10 years. Zoster vaccine. You may need this after age 25. Pneumococcal 13-valent conjugate (PCV13) vaccine. One dose is recommended after age 63. Pneumococcal polysaccharide (PPSV23) vaccine. One dose is recommended after age 52. Talk to your health care provider about which screenings and vaccines you need and how often you need them. This information is not intended to replace advice given to you by your health care provider. Make sure you discuss any questions you have with your health care provider. Document Released: 04/29/2015 Document Revised: 12/21/2015 Document Reviewed: 02/01/2015 Elsevier Interactive Patient Education  2017 [place] Prevention in the Home Falls can cause injuries. They can happen to people of all ages. There are many things you can do to make your home safe and to help prevent falls. What can I do on the outside of my home? Regularly fix the edges of walkways and driveways and fix any cracks. Remove anything that might make you trip as you walk through a door, such as a raised step or threshold. Trim any bushes or trees on the path to your home. Use bright outdoor lighting. Clear any walking paths of anything that might make someone trip, such as rocks or tools. Regularly check to see if handrails are loose or broken. Make sure that both sides of any steps have handrails. Any raised decks and porches should have guardrails on the edges. Have any leaves, snow, or ice cleared regularly. Use sand or salt on walking paths during winter. Clean up any spills in your garage right away. This includes oil or grease spills. What can I do in the bathroom? Use night lights. Install grab bars by the toilet and in the tub and shower.  Do not use towel bars as grab bars. Use non-skid mats or decals in the tub or shower. If you need to sit down in the shower, use a plastic, non-slip stool. Keep the floor dry. Clean up any water that spills on the floor as soon as it happens. Remove soap buildup in the tub or shower regularly. Attach bath mats securely with double-sided non-slip rug tape. Do not have throw rugs and other things on the floor that can make you trip. What can I do in the bedroom? Use night lights. Make sure that you have a light by your bed that is easy to reach. Do not use any sheets or blankets that are too big for your bed. They should not hang down onto the floor. Have a firm chair that has side arms. You can use this for support while you get dressed. Do not have throw rugs and other things on the floor that can make you trip. What can I do in the kitchen? Clean up any spills right away. Avoid walking on wet floors. Keep items that you use a lot in easy-to-reach places. If you need to reach something above you, use  a strong step stool that has a grab bar. Keep electrical cords out of the way. Do not use floor polish or wax that makes floors slippery. If you must use wax, use non-skid floor wax. Do not have throw rugs and other things on the floor that can make you trip. What can I do with my stairs? Do not leave any items on the stairs. Make sure that there are handrails on both sides of the stairs and use them. Fix handrails that are broken or loose. Make sure that handrails are as long as the stairways. Check any carpeting to make sure that it is firmly attached to the stairs. Fix any carpet that is loose or worn. Avoid having throw rugs at the top or bottom of the stairs. If you do have throw rugs, attach them to the floor with carpet tape. Make sure that you have a light switch at the top of the stairs and the bottom of the stairs. If you do not have them, ask someone to add them for you. What else  can I do to help prevent falls? Wear shoes that: Do not have high heels. Have rubber bottoms. Are comfortable and fit you well. Are closed at the toe. Do not wear sandals. If you use a stepladder: Make sure that it is fully opened. Do not climb a closed stepladder. Make sure that both sides of the stepladder are locked into place. Ask someone to hold it for you, if possible. Clearly mark and make sure that you can see: Any grab bars or handrails. First and last steps. Where the edge of each step is. Use tools that help you move around (mobility aids) if they are needed. These include: Canes. Walkers. Scooters. Crutches. Turn on the lights when you go into a dark area. Replace any light bulbs as soon as they burn out. Set up your furniture so you have a clear path. Avoid moving your furniture around. If any of your floors are uneven, fix them. If there are any pets around you, be aware of where they are. Review your medicines with your doctor. Some medicines can make you feel dizzy. This can increase your chance of falling. Ask your doctor what other things that you can do to help prevent falls. This information is not intended to replace advice given to you by your health care provider. Make sure you discuss any questions you have with your health care provider. Document Released: 01/27/2009 Document Revised: 09/08/2015 Document Reviewed: 05/07/2014 Elsevier Interactive Patient Education  2017 [employer].

## 2022-05-22 ENCOUNTER — Other Ambulatory Visit: Payer: Self-pay | Admitting: Family Medicine

## 2022-06-11 ENCOUNTER — Other Ambulatory Visit: Payer: Self-pay | Admitting: Family Medicine

## 2022-06-11 ENCOUNTER — Other Ambulatory Visit: Payer: Self-pay | Admitting: Neurology

## 2022-06-11 DIAGNOSIS — K117 Disturbances of salivary secretion: Secondary | ICD-10-CM

## 2022-06-11 DIAGNOSIS — F0280 Dementia in other diseases classified elsewhere without behavioral disturbance: Secondary | ICD-10-CM

## 2022-06-11 DIAGNOSIS — G20A1 Parkinson's disease without dyskinesia, without mention of fluctuations: Secondary | ICD-10-CM

## 2022-06-27 NOTE — Progress Notes (Unsigned)
Assessment/Plan:   1.  Parkinsons Disease, diagnosed October, 2022  -*** carbidopa/levodopa 25/100, 1.5 tablet 3 times per day.  2.  Sialorrhea  -They want to hold on Myobloc.  Feel that he is doing much better since starting levodopa.  -Failed Robinul  3.  Constipation  -discussed nature and pathophysiology and association with PD  -discussed importance of hydration.  Pt is to increase water intake  -pt is given a copy of the rancho recipe  -recommended daily colace  -recommended miralax prn  4.  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  -discussed nuplazid but they want to hold for now.  Subjective:   Mark Brady was seen today in follow up for Parkinsons disease.  My previous records were reviewed prior to todays visit as well as outside records available to me.  Patient with wife who supplements history.  I increased his levodopa a little bit last visit.  He did well with that, without side effects.  He saw primary care October 10 and december.  No changes were made in his medications.  He was in the emergency room in December for hives.  Current prescribed movement disorder medications: Carbidopa/levodopa 25/100, 1.5 tablet 3 times per day (increased)   PREVIOUS MEDICATIONS: Sinemet  ALLERGIES:   Allergies  Allergen Reactions   Penicillins    Aspirin Rash    CURRENT MEDICATIONS:  No outpatient medications have been marked as taking for the 06/28/22 encounter (Appointment) with Lurine Imel, Eustace Quail, DO.     Objective:   PHYSICAL EXAMINATION:    VITALS:   There were no vitals filed for this visit.    GEN:  The patient appears stated age and is in NAD. HEENT:  Normocephalic, atraumatic.  The mucous membranes are moist. The superficial temporal arteries are without ropiness or tenderness. CV:  RRR Lungs:  CTAB Neck/HEME:  There are no carotid bruits bilaterally.  Neurological examination:  Orientation: The patient is alert and oriented x3.  Looks  to wife for finer aspects of the history. Cranial nerves: There is good facial symmetry with facial hypomimia. The speech is fluent and hypophonic.  Occasionally dysarthric.  Soft palate rises symmetrically and there is no tongue deviation. Hearing is decreased to conversational tone. Sensation: Sensation is intact to light touch throughout Motor: Strength is at least antigravity x4.  Movement examination: Tone: There is normal tone in the UE/LE Abnormal movements: none Coordination:  There is decremation with RAM's, with any form of RAMS, including alternating supination and pronation of the forearm, hand opening and closing, finger taps, heel taps and toe taps, bilaterally, L slightly>R (slowness more than decremation) Gait and Station: The patient has difficulty arising out of a deep-seated chair without the use of the hands.  He pushes off to arise but he is very slow.  He is very short stepped and a bit unstable.    I have reviewed and interpreted the following labs independently    Chemistry      Component Value Date/Time   NA 136 03/29/2022 2150   NA 141 07/31/2021 1430   K 3.7 03/29/2022 2150   CL 107 03/29/2022 2150   CO2 22 03/29/2022 2150   BUN 27 (H) 03/29/2022 2150   BUN 18 07/31/2021 1430   CREATININE 1.11 03/29/2022 2150   CREATININE 1.15 09/21/2013 0719      Component Value Date/Time   CALCIUM 8.1 (L) 03/29/2022 2150   ALKPHOS 70 07/31/2021 1430   AST 10 07/31/2021 1430  ALT 9 07/31/2021 1430   BILITOT 0.3 07/31/2021 1430       Lab Results  Component Value Date   WBC 15.0 (H) 03/29/2022   HGB 13.1 03/29/2022   HCT 38.2 (L) 03/29/2022   MCV 94.1 03/29/2022   PLT 289 03/29/2022    Lab Results  Component Value Date   TSH 3.819 09/05/2014    Total time spent on today's visit was *** minutes, including both face-to-face time and nonface-to-face time.  Time included that spent on review of records (prior notes available to me/labs/imaging if pertinent),  discussing treatment and goals, answering patient's questions and coordinating care.    Cc:  Coral Spikes, DO

## 2022-06-28 ENCOUNTER — Ambulatory Visit: Payer: Medicare Other | Admitting: Neurology

## 2022-06-28 ENCOUNTER — Encounter: Payer: Self-pay | Admitting: Neurology

## 2022-06-28 VITALS — BP 110/60 | HR 60 | Resp 18 | Ht 67.0 in | Wt 175.0 lb

## 2022-06-28 DIAGNOSIS — F0280 Dementia in other diseases classified elsewhere without behavioral disturbance: Secondary | ICD-10-CM | POA: Diagnosis not present

## 2022-06-28 DIAGNOSIS — G20A1 Parkinson's disease without dyskinesia, without mention of fluctuations: Secondary | ICD-10-CM

## 2022-06-28 DIAGNOSIS — R441 Visual hallucinations: Secondary | ICD-10-CM

## 2022-06-28 NOTE — Patient Instructions (Signed)
Local and Online Resources for Power over Parkinson's Group  March 2024   LOCAL [place] PARKINSON'S GROUPS   Power over Parkinson's Group:    Power Over Parkinson's Patient Education Group will be Wednesday, March 13th-*Hybrid meting*- in person at [facility] location and via [facility], 2:00-3:00 pm.   Starting in November 2023, Power over [employer] and Care Partner Groups will meet together, with plans for separate break out session for caregivers (*this will be evolving over the next few months) Upcoming Power over Parkinson's Meetings/Care Partner Support:  2nd Wednesdays of the month at 2 pm:  March 13th, April 10th [place] at [email or web address] if interested in participating in this group    [place] OFFERINGS  Let's Try Pickleball-$25 for 6 weeks of Pickleball, starting February 2nd.  Contact Corwin Levins for more details.  [email or web address] NEW:  Parkinson's Social Game Night.  First Thursday of each month, 2:00-4:00 pm.  *Next date is MARCH 7th*.  [place], [employer].  Contact [email or web address] if interested. Parkinson's CarePartner Group for Men is in the works, if interested email [email or web address] ACT FITNESS Chair Yoga classes "Train and Gain", Fridays 10 am, ACT Fitness.  Contact Gina at [phone].  PWR! Moves [employer] Instructor-Led Classes offering at [employer]!  TUESDAYS and Wednesdays 1-2 pm.   Contact Vonna Kotyk at  [email or web address]  or [phone] (Tuesday classes are modified for chair and standing only) Drumming for Parkinson's will be held on 2nd and 4th Mondays at 11:00 am.   Located at the [employer] ([place].)  Contact Doylene Canning at [email or web address] or [phone]  Dance for Parkinson 's classes will be on Tuesdays 10-11 am starting in February. Located in the [employer], in the first floor of the [employer] ([place] To register:  [email or web address] or [phone] [facility] Class, Mondays at 11 am.  Call [phone] for details Moving [place].  Saturday, May 4th, 10 am start.  Register at [employer]:  [email or web address]  PD Health at Home continues:  Mindfulness Mondays, Wellness Wednesdays, Fitness Fridays  (PWR! Moves as part of Fitness Fridays March 22nd, 1-1:45 pm) Upcoming Education:   Managing "Off" Periods:  Return of Parkinson's Symptoms.  Wednesday, March 20th, 1-2 pm Parkinson's 101.  Wednesday, April 3rd, 1-2 pm Expert Briefing:  Understanding Pain in Parkinson's.   Wednesday, March 13th, 1-2 pm  Research Update:  Working to [employer] PD.  Wednesday, April 10th, 1-2 pm Register for virtual education and Patent attorney (webinars) at [email or web address].pl Please check out their website to sign up for emails and see their full online offerings     [place]:  [email or web address]   Third Thursday Webinars:  On the third Thursday of every month at 12 p.m. ET, join our free live webinars to learn about various aspects of living with Parkinson's disease and our work to speed medical breakthroughs.  Upcoming Webinar:  Everyday Exposure to Parkinson's:  Environmental Connections to the Disease.  Thursday, March 21st at 12 noon. Check out additional information on their website to see their full online offerings    [facility]:  [email or web address]  Upcoming Webinar:   Nutrition and Parkinson's.  Wednesday, March 6th, 12 noon Webinar Series:  Living with Parkinson's Meetup.   Third Thursdays each month, 3 pm  Care Partner Monthly  Meetup.  With Robin Searing Phinney.  First Tuesday of each month, 2 pm  Check out additional information  to Live Well Today on their website    Parkinson and Movement Disorders (PMD) Alliance:  www.pmdalliance.org  Ne[employer]Life Online:  Online Education Events  Sign up for emails, which are sent weekly to give you updates on programming and online offerings    Parkinson's Association of the [place]:  www.[email or web address]  Information on online support groups, education events, and online exercises including Yoga, Parkinson's exercises and more-LOTS of information on links to PD resources and online events  Virtual Support Group through Parkinson's Association of the [place]; next one is scheduled for Wednesday, March 6th  MOVEMENT AND EXERCISE OPPORTUNITIES  PWR! Moves Classes at [place].  Wednesdays 10 and 11 am.   Contact Amy Marriott, PT [email or web address] if interested.  PWR! Moves Class offerings at [employer]. *TUESDAYS* and Wednesdays 1-2 pm.    Contact Vonna Kotyk at  [email or web address]    Parkinson's Wellness Recovery (PWR! Moves)  www.[email or web address]  Info on the PWR! Virtual Experience:  You will have access to our expertise?through self-assessment, guided plans that start with the PD-specific fundamentals, educational content, tips, Q&A with an expert, and a growing Art therapist of PD-specific pre-recorded and live exercise classes of varying types and intensity - both physical and cognitive! If that is not enough, we offer 1:1 wellness consultations (in-person or virtual) to personalize your PWR! Research scientist (medical).   [place] Fridays:   As part of the PD Health @ Home program, this free video series focuses each week on one aspect of fitness designed to support people living with Parkinson's.? These weekly videos highlight the [place] fitness guidelines for people with Parkinson's disease.  ModemGamers.si  Dance for PD website is offering free, live-stream  classes throughout the week, as well as links to [employer] of classes:  [email or web address]  Virtual dance and Pilates for Parkinson's classes: Click on the Community Tab> Parkinson's Movement Initiative Tab.  To register for classes and for more information, visit [email or web address] and click the "community" tab.   [employer] Parkinson's Cycling Classes   [employer]:  Thursdays @ Noon-Live classes at [employer] ([employer] at [email or web address]?or [phone])  [employer]: Virtual Classes Mondays and Thursdays Jeanette [name] classes Tuesday, Wednesday and Thursday (contact Fritch at [email or web address] ?or [phone])  [place]  Varied levels of classes are offered Tuesdays and Thursdays at [employer].   Stretching with Verdis Frederickson weekly class is also offered for people with Parkinson's  To observe a class or for more information, call [phone] or email Hezzie Bump at [email or web address]   ADDITIONAL SUPPORT AND RESOURCES  Well[employer]Spring [employer]:Online Caregiver Education Opportunities:  www.well-springsolutions.org/caregiver-education/caregiver-support-group.  You may also contact Vickki Muff at [email or web address] or [phone].     Family Caregiver [phone].  Thursday, March 7th, 10:15-1:45 at [facility].  Register with [facility] (see above) Well-Spring Navigator:  Just1Navigator program, a?free service to help individuals and families through the journey of determining care for older adults.  The "Navigator" is a Vickki Muff, Education officer, museum, who will speak with a prospective client and/or loved ones to provide an assessment of the situation and a set of recommendations for a personalized care plan -- all free of charge, and whether?Well[employer]Spring [employer] offers the needed service or not. If the need is not a service we provide, we are well-connected with reputable programs in  town that we can refer you to.  www.well-springsolutions.org or  to speak with the Navigator, call [phone].

## 2022-07-25 ENCOUNTER — Ambulatory Visit (INDEPENDENT_AMBULATORY_CARE_PROVIDER_SITE_OTHER): Payer: Medicare Other | Admitting: Family Medicine

## 2022-07-25 DIAGNOSIS — I10 Essential (primary) hypertension: Secondary | ICD-10-CM | POA: Diagnosis not present

## 2022-07-25 DIAGNOSIS — G20A1 Parkinson's disease without dyskinesia, without mention of fluctuations: Secondary | ICD-10-CM

## 2022-07-25 DIAGNOSIS — D72829 Elevated white blood cell count, unspecified: Secondary | ICD-10-CM

## 2022-07-25 DIAGNOSIS — E119 Type 2 diabetes mellitus without complications: Secondary | ICD-10-CM

## 2022-07-25 DIAGNOSIS — E785 Hyperlipidemia, unspecified: Secondary | ICD-10-CM | POA: Diagnosis not present

## 2022-07-25 NOTE — Patient Instructions (Signed)
Labs today.  Continue medications.  Follow up in 6 months.

## 2022-07-25 NOTE — Assessment & Plan Note (Signed)
A1C today. Foot exam performed today.

## 2022-07-25 NOTE — Progress Notes (Signed)
Subjective:  Patient ID: Mark Brady, male    DOB: 04-09-1930  Age: 89 y.o. MRN: [id number]  CC: Chief Complaint  Patient presents with   6 month follow up     HPI:  89 year old male with HTN, Parkinson's disease, HLD, DM-2 presents for follow up.  Patient states that he is doing well. Parkinson's disease is stable currently. He is staying active. Does have some constipation which responds to prune juice.  HTN is stable on Norvasc and losartan.  DM has been stable without need for medication. Needs A1C and foot exam today.   Patient Active Problem List   Diagnosis Date Noted   Parkinson's disease 07/24/2021   History of TIA (transient ischemic attack) 02/02/2021   Sialorrhea 05/14/2020   Gastroesophageal reflux disease without esophagitis 05/13/2020   Glaucoma 11/23/2019   Diabetes 10/19/2012   Essential hypertension, benign 09/28/2012   Hyperlipidemia 09/28/2012    Social Hx   Social History   Socioeconomic History   Marital status: Married    Spouse name: Bonita Quin   Number of children: 5   Years of education: Not on file   Highest education level: Not on file  Occupational History   Occupation: retired    Comment: tobacco and beef farmer  Tobacco Use   Smoking status: Never   Smokeless tobacco: Never  Substance and Sexual Activity   Alcohol use: No   Drug use: No   Sexual activity: Yes    Birth control/protection: None  Other Topics Concern   Not on file  Social History Narrative   3children, all live nearby.   Grandchildren and great grandchildren.      Right Handed    Social Determinants of Health   Financial Resource Strain: Low Risk  (05/11/2022)   Overall Financial Resource Strain (CARDIA)    Difficulty of Paying Living Expenses: Not hard at all  Food Insecurity: No Food Insecurity (05/11/2022)   Hunger Vital Sign    Worried About Running Out of Food in the Last Year: Never true    Ran Out of Food in the Last Year: Never true   Transportation Needs: No Transportation Needs (05/11/2022)   PRAPARE - Administrator, Civil Service (Medical): No    Lack of Transportation (Non-Medical): No  Physical Activity: Insufficiently Active (05/11/2022)   Exercise Vital Sign    Days of Exercise per Week: 3 days    Minutes of Exercise per Session: 30 min  Stress: No Stress Concern Present (05/11/2022)   [employer] of Occupational Health - Occupational Stress Questionnaire    Feeling of Stress : Not at all  Social Connections: Moderately Integrated (05/11/2022)   Social Connection and Isolation Panel [NHANES]    Frequency of Communication with Friends and Family: More than three times a week    Frequency of Social Gatherings with Friends and Family: Three times a week    Attends Religious Services: More than 4 times per year    Active Member of Clubs or Organizations: No    Attends Banker Meetings: Never    Marital Status: Married    Review of Systems Per HPI  Objective:  BP 124/80   Pulse 64   Temp (!) 97.2 F (36.2 C)   Ht 5\' 7"  (1.702 m)   Wt 174 lb (78.9 kg)   SpO2 99%   BMI 27.25 kg/m      07/25/2022    9:28 AM 06/28/2022   10:45 AM 05/11/2022  2:46 PM  BP/Weight  Systolic BP 124 110 128  Diastolic BP 80 60 62  Wt. (Lbs) 174 175 175  BMI 27.25 kg/m2 27.41 kg/m2 26.61 kg/m2    Physical Exam Vitals and nursing note reviewed.  Constitutional:      General: He is not in acute distress. HENT:     Head: Normocephalic and atraumatic.  Eyes:     General:        Right eye: No discharge.        Left eye: No discharge.     Conjunctiva/sclera: Conjunctivae normal.  Cardiovascular:     Rate and Rhythm: Normal rate and regular rhythm.  Pulmonary:     Effort: Pulmonary effort is normal.     Breath sounds: Normal breath sounds. No wheezing or rales.  Abdominal:     General: There is no distension.     Palpations: Abdomen is soft.     Tenderness: There is no abdominal  tenderness.  Neurological:     Mental Status: He is alert.  Psychiatric:        Mood and Affect: Mood normal.        Behavior: Behavior normal.     Lab Results  Component Value Date   WBC 15.0 (H) 03/29/2022   HGB 13.1 03/29/2022   HCT 38.2 (L) 03/29/2022   PLT 289 03/29/2022   GLUCOSE 155 (H) 03/29/2022   CHOL 163 07/31/2021   TRIG 162 (H) 07/31/2021   HDL 66 07/31/2021   LDLCALC 70 07/31/2021   ALT 9 07/31/2021   AST 10 07/31/2021   NA 136 03/29/2022   K 3.7 03/29/2022   CL 107 03/29/2022   CREATININE 1.11 03/29/2022   BUN 27 (H) 03/29/2022   CO2 22 03/29/2022   TSH 3.819 09/05/2014   INR 0.90 06/02/2010   HGBA1C 6.6 (H) 07/31/2021   MICROALBUR 10 01/31/2016     Assessment & Plan:   Problem List Items Addressed This Visit       Cardiovascular and Mediastinum   Essential hypertension, benign    Stable. Continue Norvasc and Losartan.         Endocrine   Diabetes    A1C today. Foot exam performed today.      Relevant Orders   Hemoglobin A1c   Microalbumin / creatinine urine ratio   CMP14+EGFR     Nervous and Auditory   Parkinson's disease    Stable. Continue Sinemet.        Other   Hyperlipidemia   Relevant Orders   Lipid panel   Other Visit Diagnoses     Leukocytosis, unspecified type       Relevant Orders   CBC      Follow-up:  6 months  Keimora Swartout Adriana Simas DO [facility]

## 2022-07-25 NOTE — Assessment & Plan Note (Signed)
Stable. Continue Norvasc and Losartan.

## 2022-07-25 NOTE — Assessment & Plan Note (Signed)
Stable Continue Sinemet 

## 2022-07-26 LAB — CMP14+EGFR
ALT: 9 IU/L (ref 0–44)
AST: 14 IU/L (ref 0–40)
Albumin/Globulin Ratio: 1.9 (ref 1.2–2.2)
Albumin: 4.5 g/dL (ref 3.6–4.6)
Alkaline Phosphatase: 81 IU/L (ref 44–121)
BUN/Creatinine Ratio: 17 (ref 10–24)
BUN: 18 mg/dL (ref 10–36)
Bilirubin Total: 0.3 mg/dL (ref 0.0–1.2)
CO2: 21 mmol/L (ref 20–29)
Calcium: 9.6 mg/dL (ref 8.6–10.2)
Chloride: 104 mmol/L (ref 96–106)
Creatinine, Ser: 1.06 mg/dL (ref 0.76–1.27)
Globulin, Total: 2.4 g/dL (ref 1.5–4.5)
Glucose: 116 mg/dL — ABNORMAL HIGH (ref 70–99)
Potassium: 4.4 mmol/L (ref 3.5–5.2)
Sodium: 141 mmol/L (ref 134–144)
Total Protein: 6.9 g/dL (ref 6.0–8.5)
eGFR: 67 mL/min/{1.73_m2} (ref >59)

## 2022-07-26 LAB — CBC
Hematocrit: 43 % (ref 37.5–51.0)
Hemoglobin: 14.5 g/dL (ref 13.0–17.7)
MCH: 32.1 pg (ref 26.6–33.0)
MCHC: 33.7 g/dL (ref 31.5–35.7)
MCV: 95 fL (ref 79–97)
Platelets: 317 10*3/uL (ref 150–450)
RBC: 4.52 x10E6/uL (ref 4.14–5.80)
RDW: 12.7 % (ref 11.6–15.4)
WBC: 9.9 10*3/uL (ref 3.4–10.8)

## 2022-07-26 LAB — LIPID PANEL
Chol/HDL Ratio: 2.7 ratio (ref 0.0–5.0)
Cholesterol, Total: 173 mg/dL (ref 100–199)
HDL: 64 mg/dL (ref >39)
LDL Chol Calc (NIH): 91 mg/dL (ref 0–99)
Triglycerides: 99 mg/dL (ref 0–149)
VLDL Cholesterol Cal: 18 mg/dL (ref 5–40)

## 2022-07-26 LAB — HEMOGLOBIN A1C
Est. average glucose Bld gHb Est-mCnc: 146 mg/dL
Hgb A1c MFr Bld: 6.7 % — ABNORMAL HIGH (ref 4.8–5.6)

## 2022-07-26 LAB — MICROALBUMIN / CREATININE URINE RATIO
Creatinine, Urine: 25.6 mg/dL
Microalb/Creat Ratio: 29 mg/g creat (ref 0–29)
Microalbumin, Urine: 7.3 ug/mL

## 2022-07-30 ENCOUNTER — Other Ambulatory Visit: Payer: Self-pay | Admitting: Family Medicine

## 2022-08-06 ENCOUNTER — Encounter: Payer: Self-pay | Admitting: Family Medicine

## 2022-08-19 ENCOUNTER — Other Ambulatory Visit: Payer: Self-pay | Admitting: Family Medicine

## 2022-09-17 ENCOUNTER — Other Ambulatory Visit: Payer: Self-pay | Admitting: Family Medicine

## 2022-10-26 IMAGING — DX DG CHEST 2V
2 series · 2 of 2 positions shown · non-contrast
Comparison: 08/10/2020

CLINICAL DATA: Cough for several weeks, hypertension

EXAM:
CHEST - 2 VIEW

[chest pa]
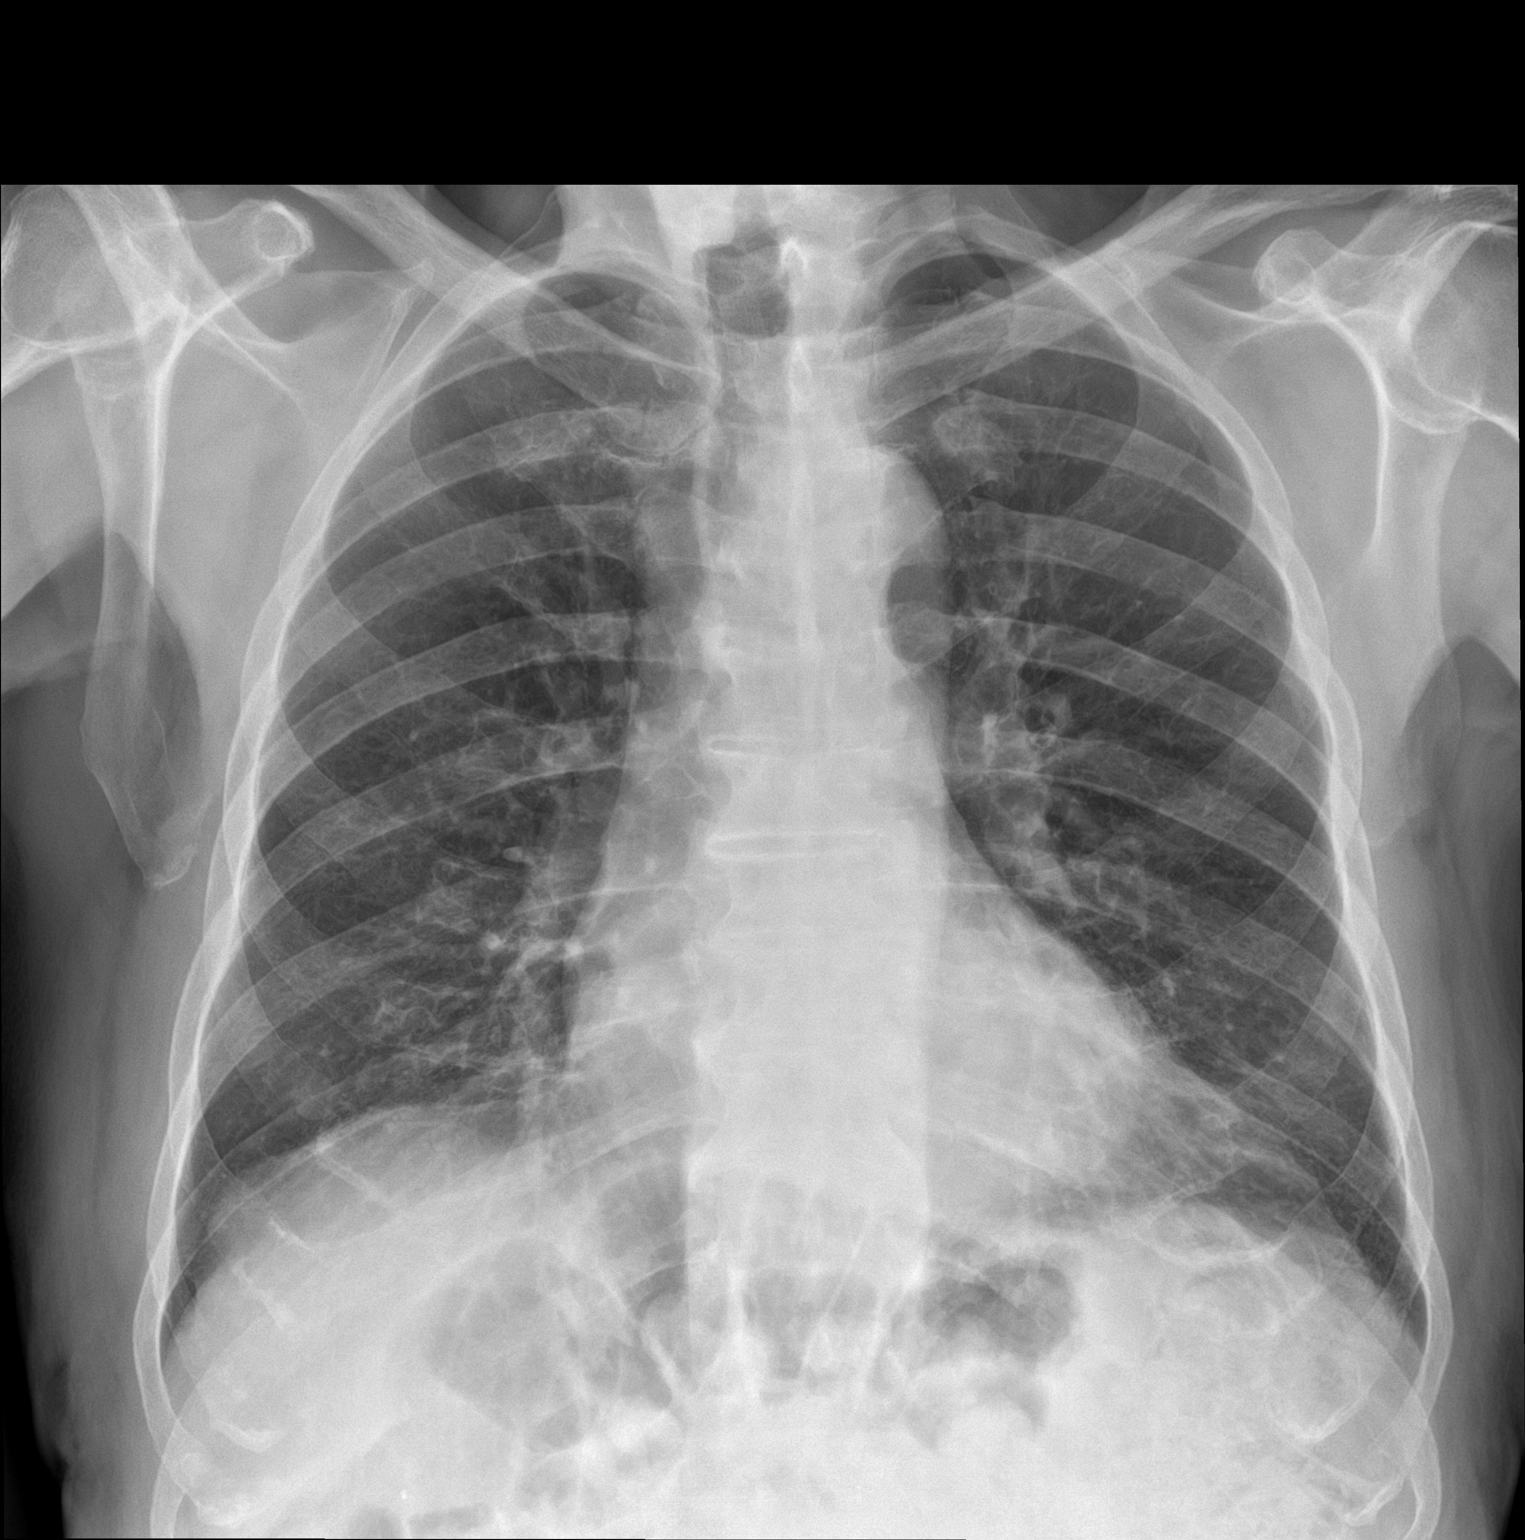

[chest lat]
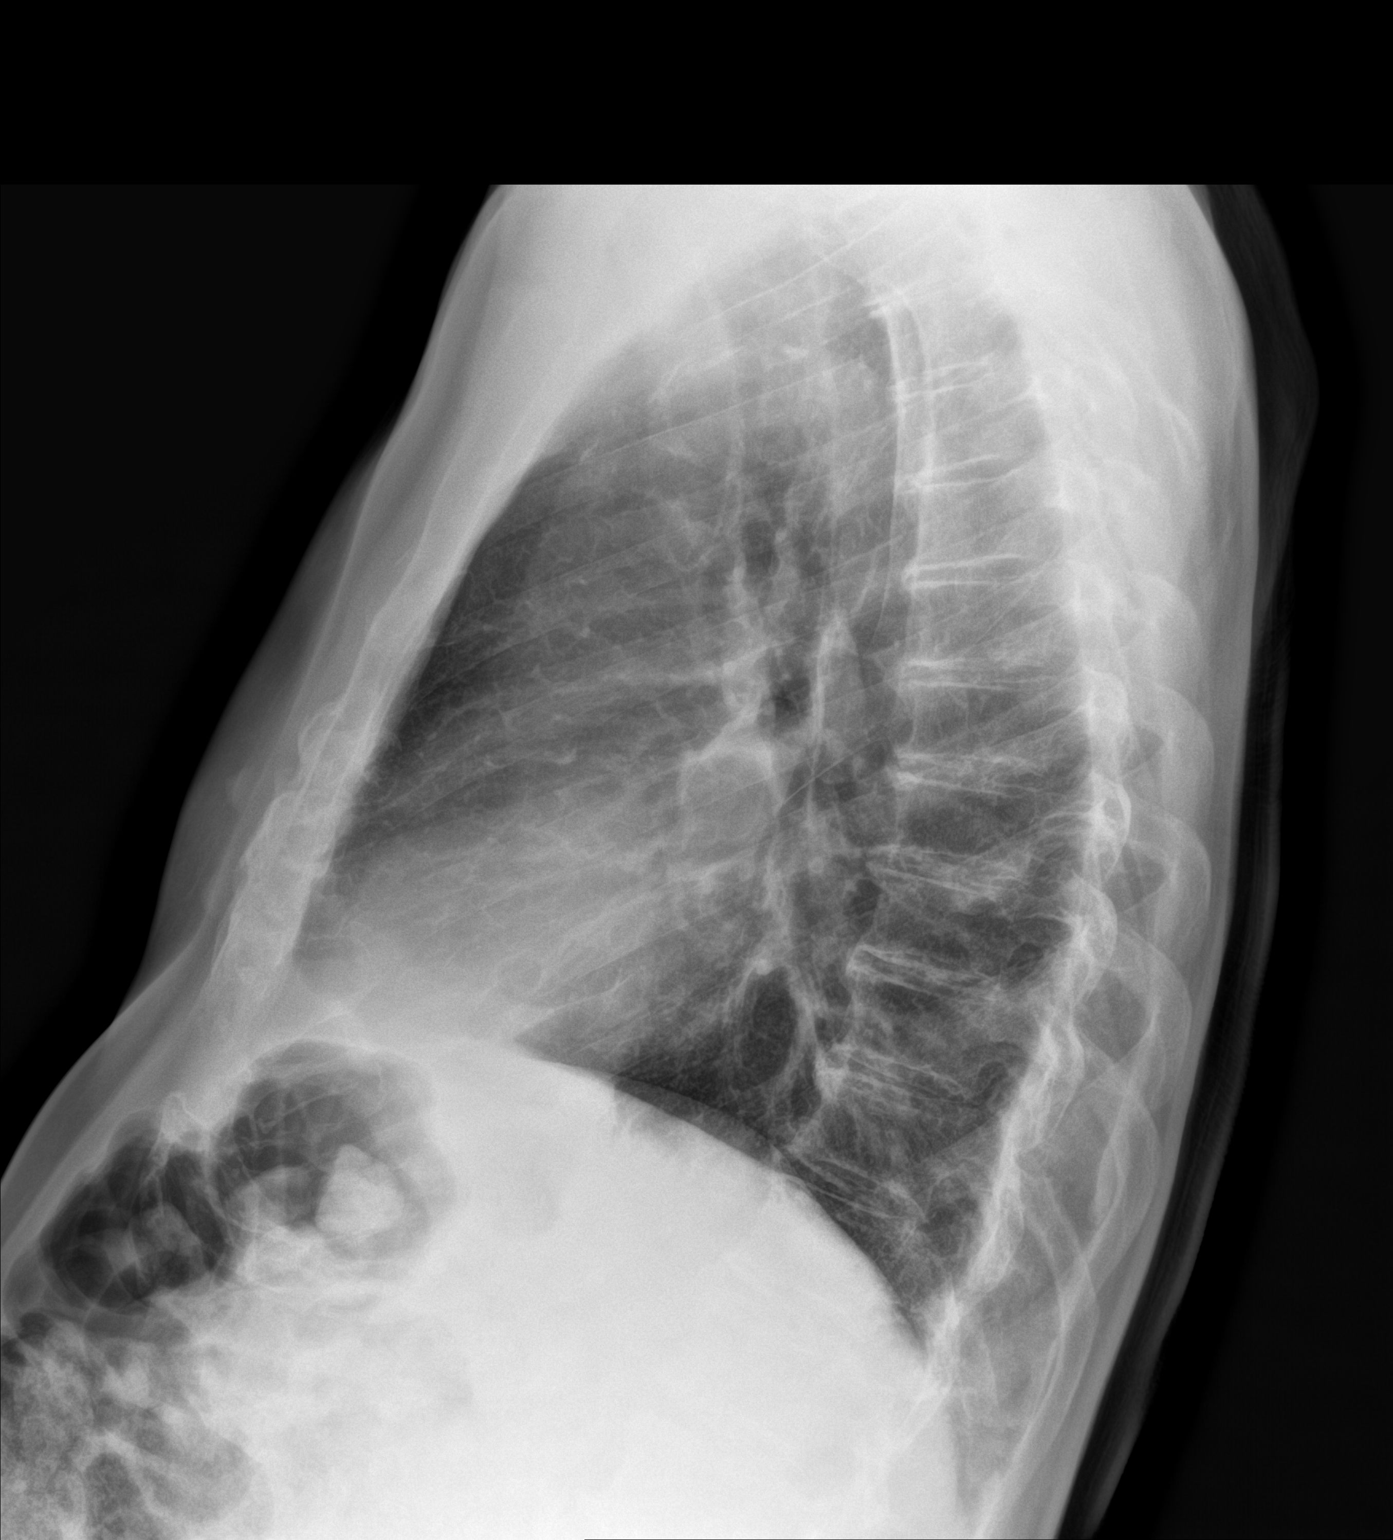

[2 of 2 positions shown; findings below may reference images not displayed]

FINDINGS: Frontal and lateral views of the chest demonstrate an unremarkable
cardiac silhouette. No airspace disease, effusion, or pneumothorax.
No acute bony abnormality.
IMPRESSION: 1. No acute intrathoracic process.

## 2022-11-24 ENCOUNTER — Other Ambulatory Visit: Payer: Self-pay | Admitting: Family Medicine

## 2022-12-05 ENCOUNTER — Emergency Department (HOSPITAL_COMMUNITY)
Admission: EM | Admit: 2022-12-05 | Discharge: 2022-12-05 | Disposition: A | Discharge status: Home / Self Care | Payer: Medicare Other | Attending: Emergency Medicine | Admitting: Emergency Medicine

## 2022-12-05 ENCOUNTER — Other Ambulatory Visit: Payer: Self-pay

## 2022-12-05 ENCOUNTER — Emergency Department (HOSPITAL_COMMUNITY): Payer: Medicare Other

## 2022-12-05 ENCOUNTER — Encounter (HOSPITAL_COMMUNITY): Payer: Self-pay | Admitting: Emergency Medicine

## 2022-12-05 DIAGNOSIS — R110 Nausea: Secondary | ICD-10-CM | POA: Diagnosis present

## 2022-12-05 DIAGNOSIS — M545 Low back pain, unspecified: Secondary | ICD-10-CM | POA: Insufficient documentation

## 2022-12-05 DIAGNOSIS — Z7901 Long term (current) use of anticoagulants: Secondary | ICD-10-CM | POA: Insufficient documentation

## 2022-12-05 DIAGNOSIS — Z79899 Other long term (current) drug therapy: Secondary | ICD-10-CM | POA: Diagnosis not present

## 2022-12-05 DIAGNOSIS — W182XXA Fall in (into) shower or empty bathtub, initial encounter: Secondary | ICD-10-CM | POA: Insufficient documentation

## 2022-12-05 DIAGNOSIS — E119 Type 2 diabetes mellitus without complications: Secondary | ICD-10-CM | POA: Diagnosis not present

## 2022-12-05 DIAGNOSIS — W19XXXA Unspecified fall, initial encounter: Secondary | ICD-10-CM

## 2022-12-05 DIAGNOSIS — Y92002 Bathroom of unspecified non-institutional (private) residence single-family (private) house as the place of occurrence of the external cause: Secondary | ICD-10-CM | POA: Diagnosis not present

## 2022-12-05 DIAGNOSIS — G20C Parkinsonism, unspecified: Secondary | ICD-10-CM | POA: Insufficient documentation

## 2022-12-05 DIAGNOSIS — I10 Essential (primary) hypertension: Secondary | ICD-10-CM | POA: Insufficient documentation

## 2022-12-05 DIAGNOSIS — I4891 Unspecified atrial fibrillation: Secondary | ICD-10-CM | POA: Diagnosis not present

## 2022-12-05 LAB — URINALYSIS, ROUTINE W REFLEX MICROSCOPIC
Bacteria, UA: NONE SEEN
Bilirubin Urine: NEGATIVE
Glucose, UA: NEGATIVE mg/dL
Ketones, ur: 5 mg/dL — AB
Leukocytes,Ua: NEGATIVE
Nitrite: NEGATIVE
Protein, ur: NEGATIVE mg/dL
RBC / HPF: >50 RBC/hpf (ref 0–5)
Specific Gravity, Urine: 1.015 (ref 1.005–1.030)
pH: 5 (ref 5.0–8.0)

## 2022-12-05 LAB — CBC WITH DIFFERENTIAL/PLATELET
Abs Immature Granulocytes: 0.04 10*3/uL (ref 0.00–0.07)
Basophils Absolute: 0.1 10*3/uL (ref 0.0–0.1)
Basophils Relative: 1 %
Eosinophils Absolute: 0.4 10*3/uL (ref 0.0–0.5)
Eosinophils Relative: 4 %
HCT: 37.9 % — ABNORMAL LOW (ref 39.0–52.0)
Hemoglobin: 12.8 g/dL — ABNORMAL LOW (ref 13.0–17.0)
Immature Granulocytes: 0 %
Lymphocytes Relative: 17 %
Lymphs Abs: 1.6 10*3/uL (ref 0.7–4.0)
MCH: 32.5 pg (ref 26.0–34.0)
MCHC: 33.8 g/dL (ref 30.0–36.0)
MCV: 96.2 fL (ref 80.0–100.0)
Monocytes Absolute: 0.8 10*3/uL (ref 0.1–1.0)
Monocytes Relative: 8 %
Neutro Abs: 6.8 10*3/uL (ref 1.7–7.7)
Neutrophils Relative %: 70 %
Platelets: 245 10*3/uL (ref 150–400)
RBC: 3.94 MIL/uL — ABNORMAL LOW (ref 4.22–5.81)
RDW: 12.7 % (ref 11.5–15.5)
WBC: 9.7 10*3/uL (ref 4.0–10.5)
nRBC: 0 % (ref 0.0–0.2)

## 2022-12-05 LAB — COMPREHENSIVE METABOLIC PANEL
ALT: 6 U/L (ref 0–44)
AST: 12 U/L — ABNORMAL LOW (ref 15–41)
Albumin: 3.7 g/dL (ref 3.5–5.0)
Alkaline Phosphatase: 49 U/L (ref 38–126)
Anion gap: 8 (ref 5–15)
BUN: 23 mg/dL (ref 8–23)
CO2: 24 mmol/L (ref 22–32)
Calcium: 8.8 mg/dL — ABNORMAL LOW (ref 8.9–10.3)
Chloride: 104 mmol/L (ref 98–111)
Creatinine, Ser: 0.9 mg/dL (ref 0.61–1.24)
GFR, Estimated: >60 mL/min (ref >60)
Glucose, Bld: 131 mg/dL — ABNORMAL HIGH (ref 70–99)
Potassium: 3.8 mmol/L (ref 3.5–5.1)
Sodium: 136 mmol/L (ref 135–145)
Total Bilirubin: 0.5 mg/dL (ref 0.3–1.2)
Total Protein: 6.3 g/dL — ABNORMAL LOW (ref 6.5–8.1)

## 2022-12-05 LAB — LIPASE, BLOOD: Lipase: 23 U/L (ref 11–51)

## 2022-12-05 MED ORDER — ONDANSETRON HCL 4 MG PO TABS: 4.0000 mg | ORAL_TABLET | Freq: Three times a day (TID) | ORAL | 0 refills | Status: DC | PRN

## 2022-12-05 NOTE — ED Triage Notes (Signed)
Pt had an unwitnessed fall yesterday morning, denies hitting head, denies LOC, denies use of blood thinners. C/o nausea started 0430 this morning, denies vomiting, denies any pain

## 2022-12-05 NOTE — ED Notes (Signed)
Pt is hard of hearing  

## 2022-12-05 NOTE — ED Notes (Signed)
Patient transported to CT 

## 2022-12-05 NOTE — ED Provider Notes (Signed)
[place] EMERGENCY DEPARTMENT AT [facility] Provider Note   CSN: [id number] Arrival date & time: 12/05/22  0865     History  Chief Complaint  Patient presents with   Fall    Mark Brady is a 89 y.o. male with a history include hypertension, CVA, hyperlipidemia and type 2 diabetes and Parkinson's disease presenting for evaluation of a fall which occurred 2 days ago.  Wife at the bedside states they sleep in separate rooms, she noticed he got up to use the bathroom somewhere between 3 and 4 AM, did not appear to have any difficulty getting back to his bed, but when she woke at 7 she found that he was sitting on the floor in front of his bed.  He states that he fell going to the bathroom but is unable to state whether he tripped or became lightheaded, he does deny any LOC.  He states he actually fell near the bathroom but was scooting on his buttocks to get to his bed when his wife found him.  He denies any pain from his fall, both he and wife states he had a fairly normal day yesterday, was ambulatory, ate meals and was without complaint but woke this morning with some nausea, therefore she wanted him checked out.  He denies vomiting, has had no chest pain, shortness of breath, denies abdominal pain, also no increased back pain although has some low back pain at baseline, also no hip or extremity pain, denies headache, dizziness or focal weakness.  Has had no treatment prior to arrival.  He has been able to eat a small breakfast prior to arriving here.  The history is provided by the patient and the spouse.       Home Medications Prior to Admission medications   Medication Sig Start Date End Date Taking? Authorizing Provider  ondansetron (ZOFRAN) 4 MG tablet Take 1 tablet (4 mg total) by mouth every 8 (eight) hours as needed for nausea. 12/05/22  Yes IdolRaynelle Fanning, PA-C  Accu-Chek Softclix Lancets lancets USE AS DIRECTED ONCE DAILY 11/29/21   Everlene Other G, DO  amLODipine  (NORVASC) 10 MG tablet TAKE 1 TABLET(10 MG) BY MOUTH DAILY 07/31/22   Cook, Jayce G, DO  carbidopa-levodopa (SINEMET IR) 25-100 MG tablet TAKE 1 AND 1/2 TABLETS BY MOUTH THREE TIMES DAILY 06/12/22   Tat, Octaviano Batty, DO  cetirizine (ZYRTEC) 5 MG tablet Take 1 tablet (5 mg total) by mouth daily. 04/06/22   Tommie Sams, DO  Cholecalciferol (VITAMIN D-3) 125 MCG (5000 UT) TABS Take by mouth.    [provider]  dorzolamide-timolol (COSOPT) 22.3-6.8 MG/ML ophthalmic solution Place 1 drop into both eyes daily.    [provider]  famotidine (PEPCID) 40 MG tablet TAKE 1 TABLET(40 MG) BY MOUTH DAILY 08/20/22   Everlene Other G, DO  glucose blood (ACCU-CHEK AVIVA PLUS) test strip USE TO CHECK BLOOD SUGAR DAILY 05/24/22   Everlene Other G, DO  latanoprost (XALATAN) 0.005 % ophthalmic solution SMARTSIG:1 Drop(s) In Eye(s) Every Evening 04/30/22   [provider]  losartan (COZAAR) 100 MG tablet TAKE 1 TABLET(100 MG) BY MOUTH DAILY 09/18/22   Tommie Sams, DO  mupirocin ointment (BACTROBAN) 2 % Apply 1 application topically daily as needed. 02/02/21   Tommie Sams, DO  pantoprazole (PROTONIX) 40 MG tablet TAKE 1 TABLET(40 MG) BY MOUTH DAILY 11/26/22   Everlene Other G, DO  pravastatin (PRAVACHOL) 40 MG tablet TAKE 1 TABLET(40 MG) BY MOUTH  DAILY 09/18/22   Tommie Sams, DO      Allergies    Penicillins and Aspirin    Review of Systems   Review of Systems  Constitutional:  Negative for chills and fever.  HENT:  Negative for congestion and sore throat.   Eyes: Negative.   Respiratory:  Negative for chest tightness and shortness of breath.   Cardiovascular:  Negative for chest pain.  Gastrointestinal:  Positive for nausea. Negative for abdominal pain, diarrhea and vomiting.  Genitourinary: Negative.   Musculoskeletal:  Negative for arthralgias, joint swelling and neck pain.  Skin: Negative.  Negative for rash and wound.  Neurological:  Negative for dizziness, syncope, weakness,  light-headedness, numbness and headaches.  Psychiatric/Behavioral: Negative.    All other systems reviewed and are negative.   Physical Exam Updated Vital Signs BP 123/83 (BP Location: Left Arm)   Pulse 92   Temp 98 F (36.7 C)   Resp 17   Ht 5\' 7"  (1.702 m)   Wt 79.4 kg   SpO2 94%   BMI 27.41 kg/m  Physical Exam Vitals and nursing note reviewed.  Constitutional:      Appearance: He is well-developed.  HENT:     Head: Normocephalic and atraumatic.     Mouth/Throat:     Mouth: Mucous membranes are moist.  Eyes:     Extraocular Movements: Extraocular movements intact.     Conjunctiva/sclera: Conjunctivae normal.     Pupils: Pupils are equal, round, and reactive to light.  Cardiovascular:     Rate and Rhythm: Normal rate and regular rhythm.     Heart sounds: Normal heart sounds.  Pulmonary:     Effort: Pulmonary effort is normal.     Breath sounds: Normal breath sounds. No wheezing.  Abdominal:     General: Bowel sounds are normal.     Palpations: Abdomen is soft.     Tenderness: There is no abdominal tenderness.  Musculoskeletal:        General: Normal range of motion.     Cervical back: Normal range of motion.  Skin:    General: Skin is warm and dry.  Neurological:     General: No focal deficit present.     Mental Status: He is alert.     Motor: Motor function is intact.     Gait: Gait normal.     Comments: Equal grip strength, moves all extremities without complaint, but does have some chronic stiffness in the right knee, not worsened since his fall.     ED Results / Procedures / Treatments   Labs (all labs ordered are listed, but only abnormal results are displayed) Labs Reviewed  CBC WITH DIFFERENTIAL/PLATELET - Abnormal; Notable for the following components:      Result Value   RBC 3.94 (*)    Hemoglobin 12.8 (*)    HCT 37.9 (*)    All other components within normal limits  COMPREHENSIVE METABOLIC PANEL - Abnormal; Notable for the following  components:   Glucose, Bld 131 (*)    Calcium 8.8 (*)    Total Protein 6.3 (*)    AST 12 (*)    All other components within normal limits  URINALYSIS, ROUTINE W REFLEX MICROSCOPIC - Abnormal; Notable for the following components:   Hgb urine dipstick MODERATE (*)    Ketones, ur 5 (*)    All other components within normal limits  LIPASE, BLOOD    EKG EKG Interpretation Date/Time:  Wednesday December 05 2022 10:54:15 EDT  Ventricular Rate:  74 PR Interval:  247 QRS Duration:  96 QT Interval:  418 QTC Calculation: 464 R Axis:   -46  Text Interpretation: Sinus rhythm Prolonged PR interval Left anterior fascicular block No acute changes No significant change since last tracing Confirmed by Derwood Kaplan [phone]) on 12/05/2022 12:02:09 PM  Radiology CT Head Wo Contrast  Result Date: 12/05/2022 CLINICAL DATA:  Head trauma, minor (Age >= 65y); Neck trauma (Age >= 65y). EXAM: CT HEAD WITHOUT CONTRAST CT CERVICAL SPINE WITHOUT CONTRAST TECHNIQUE: Multidetector CT imaging of the head and cervical spine was performed following the standard protocol without intravenous contrast. Multiplanar CT image reconstructions of the cervical spine were also generated. RADIATION DOSE REDUCTION: This exam was performed according to the departmental dose-optimization program which includes automated exposure control, adjustment of the mA and/or kV according to patient size and/or use of iterative reconstruction technique. COMPARISON:  Head CT 04/03/2015.  MRI cervical spine 06/02/2010. FINDINGS: CT HEAD FINDINGS Brain: No acute hemorrhage. Moderate chronic small-vessel disease with old lacunar infarct in the left basal ganglia. Cortical gray-white differentiation is otherwise preserved. No hydrocephalus or extra-axial collection. No mass effect or midline shift. Vascular: No hyperdense vessel or unexpected calcification. Skull: No calvarial fracture or suspicious bone lesion. Skull base is unremarkable.  Sinuses/Orbits: No acute finding. Other: None. CT CERVICAL SPINE FINDINGS Alignment: 3 mm anterolisthesis of C4 on C5, likely degenerative. No traumatic malalignment. Skull base and vertebrae: No acute fracture. Normal craniocervical junction. No suspicious bone lesions. Soft tissues and spinal canal: No prevertebral fluid or swelling. No visible canal hematoma. Disc levels: Mild cervical spondylosis without high-grade spinal canal stenosis. Upper chest: No acute findings. Other: Atherosclerotic calcifications of the carotid bulbs. IMPRESSION: 1. No acute intracranial abnormality. Moderate chronic small-vessel disease with old lacunar infarct in the left basal ganglia. 2. No acute cervical spine fracture or traumatic malalignment. Electronically Signed   By: Orvan Falconer M.D.   On: 12/05/2022 12:50   CT Cervical Spine Wo Contrast  Result Date: 12/05/2022 CLINICAL DATA:  Head trauma, minor (Age >= 65y); Neck trauma (Age >= 65y). EXAM: CT HEAD WITHOUT CONTRAST CT CERVICAL SPINE WITHOUT CONTRAST TECHNIQUE: Multidetector CT imaging of the head and cervical spine was performed following the standard protocol without intravenous contrast. Multiplanar CT image reconstructions of the cervical spine were also generated. RADIATION DOSE REDUCTION: This exam was performed according to the departmental dose-optimization program which includes automated exposure control, adjustment of the mA and/or kV according to patient size and/or use of iterative reconstruction technique. COMPARISON:  Head CT 04/03/2015.  MRI cervical spine 06/02/2010. FINDINGS: CT HEAD FINDINGS Brain: No acute hemorrhage. Moderate chronic small-vessel disease with old lacunar infarct in the left basal ganglia. Cortical gray-white differentiation is otherwise preserved. No hydrocephalus or extra-axial collection. No mass effect or midline shift. Vascular: No hyperdense vessel or unexpected calcification. Skull: No calvarial fracture or suspicious bone  lesion. Skull base is unremarkable. Sinuses/Orbits: No acute finding. Other: None. CT CERVICAL SPINE FINDINGS Alignment: 3 mm anterolisthesis of C4 on C5, likely degenerative. No traumatic malalignment. Skull base and vertebrae: No acute fracture. Normal craniocervical junction. No suspicious bone lesions. Soft tissues and spinal canal: No prevertebral fluid or swelling. No visible canal hematoma. Disc levels: Mild cervical spondylosis without high-grade spinal canal stenosis. Upper chest: No acute findings. Other: Atherosclerotic calcifications of the carotid bulbs. IMPRESSION: 1. No acute intracranial abnormality. Moderate chronic small-vessel disease with old lacunar infarct in the left basal ganglia. 2. No acute cervical spine fracture  or traumatic malalignment. Electronically Signed   By: Orvan Falconer M.D.   On: 12/05/2022 12:50    Procedures Procedures    Medications Ordered in ED Medications - No data to display  ED Course/ Medical Decision Making/ A&P                                 Medical Decision Making Patient presenting with a fall which occurred 2 nights ago, asymptomatic yesterday but then woke with some nausea this morning.  Broad differential including intra-abdominal process such as infection, obstruction, pancreatitis, hepatitis, gastroenteritis, although his abdominal exam is benign and reassuring.  No visible signs of trauma, no evidence of head injury.  Given nausea in association with recent fall and the fact that he is on Eliquis for his A-fib we did decide to scan his head, also added a C-spine, both are negative for acute findings.  Labs are also reassuring per below.  Patient was stable at time of discharge, Zofran for as needed use, plan follow-up with primary provider as needed, return precautions were outlined.  Amount and/or Complexity of Data Reviewed Labs: ordered.    Details: Labs are reassuring, c-Met, lipase CBC all reassuring, urinalysis negative for  infection, he does have moderate hemoglobin however he has no WBCs, no bacteria. Radiology: ordered.    Details: CT head and C-spine reviewed, no acute findings. ECG/medicine tests: ordered.    Details: Sinus rhythm, rate 74, prolonged PR, no acute changes.  Risk Prescription drug management.           Final Clinical Impression(s) / ED Diagnoses Final diagnoses:  Fall, initial encounter  Nausea    Rx / DC Orders ED Discharge Orders          Ordered    ondansetron (ZOFRAN) 4 MG tablet  Every 8 hours PRN        12/05/22 1358              Burgess Amor, PA-C 12/05/22 1525    Derwood Kaplan, MD 12/06/22 1113

## 2022-12-05 NOTE — ED Notes (Addendum)
Pt ambulated several feet with standby assistance if needed. Pt ambulated without complication--PA-C made aware

## 2022-12-05 NOTE — Discharge Instructions (Signed)
Your exam,  lab tests and xrays today are reassuring.  You are being prescribed nausea medicine in case this symptom returns.

## 2022-12-05 NOTE — ED Notes (Signed)
Pt given a urinal.

## 2022-12-06 ENCOUNTER — Telehealth: Payer: Self-pay

## 2022-12-06 NOTE — Transitions of Care (Post Inpatient/ED Visit) (Signed)
   12/06/2022  Name: Mark Brady MRN: [id number] DOB: 02-28-1930  Today's TOC FU Call Status: Today's TOC FU Call Status:: Unsuccessful Call (1st Attempt) Unsuccessful Call (1st Attempt) Date: 12/06/22  Attempted to reach the patient regarding the most recent Inpatient/ED visit.  Follow Up Plan: Additional outreach attempts will be made to reach the patient to complete the Transitions of Care (Post Inpatient/ED visit) call.   Signature  Karena Addison, LPN [facility] Nurse Health Advisor Direct Dial [phone]

## 2022-12-10 NOTE — Transitions of Care (Post Inpatient/ED Visit) (Signed)
   12/10/2022  Name: Mark Brady MRN: [id number] DOB: 1929/12/13  Today's TOC FU Call Status: Today's TOC FU Call Status:: Unsuccessful Call (2nd Attempt) Unsuccessful Call (1st Attempt) Date: 12/06/22 Unsuccessful Call (2nd Attempt) Date: 12/10/22  Attempted to reach the patient regarding the most recent Inpatient/ED visit.  Follow Up Plan: No further outreach attempts will be made at this time. We have been unable to contact the patient.  Signature Karena Addison, LPN [facility] Nurse Health Advisor Direct Dial [phone]

## 2022-12-12 ENCOUNTER — Other Ambulatory Visit: Payer: Self-pay | Admitting: Neurology

## 2022-12-12 DIAGNOSIS — G20A1 Parkinson's disease without dyskinesia, without mention of fluctuations: Secondary | ICD-10-CM

## 2022-12-12 DIAGNOSIS — K117 Disturbances of salivary secretion: Secondary | ICD-10-CM

## 2022-12-23 ENCOUNTER — Other Ambulatory Visit: Payer: Self-pay | Admitting: Family Medicine

## 2022-12-24 ENCOUNTER — Ambulatory Visit (INDEPENDENT_AMBULATORY_CARE_PROVIDER_SITE_OTHER): Payer: Medicare Other | Admitting: Family Medicine

## 2022-12-24 ENCOUNTER — Encounter: Payer: Self-pay | Admitting: Family Medicine

## 2022-12-24 VITALS — BP 132/78 | HR 60 | Temp 97.7°F | Wt 170.4 lb

## 2022-12-24 DIAGNOSIS — G20A1 Parkinson's disease without dyskinesia, without mention of fluctuations: Secondary | ICD-10-CM

## 2022-12-24 DIAGNOSIS — I10 Essential (primary) hypertension: Secondary | ICD-10-CM | POA: Diagnosis not present

## 2022-12-24 NOTE — Progress Notes (Signed)
Subjective:  Patient ID: Mark Brady, male    DOB: [birth year]  Age: 89 y.o. MRN: [id number]  CC: Follow up recent fall   HPI:  89 year old male presents for evaluation of the above.  Patient recently suffered a fall on 8/21.  Was evaluated in the ER.  Had a negative workup.  Patient states that he is doing fine.  He has no complaints or concerns at this time.  His wife is concerned given recent falls.  Has Parkinson's and has difficulty with stiffness and gait.  He is now using a cane.  Hypertension is stable on amlodipine and losartan.  Diabetes has been stable without the use of medication.  Managed with diet.  Patient Active Problem List   Diagnosis Date Noted   Parkinson's disease 07/24/2021   Sialorrhea 05/14/2020   Gastroesophageal reflux disease without esophagitis 05/13/2020   Glaucoma 11/23/2019   Diabetes (HCC) 10/19/2012   Essential hypertension, benign 09/28/2012   Hyperlipidemia 09/28/2012    Social Hx   Social History   Socioeconomic History   Marital status: Married    Spouse name: Bonita Quin   Number of children: 5   Years of education: Not on file   Highest education level: Not on file  Occupational History   Occupation: retired    Comment: tobacco and beef farmer  Tobacco Use   Smoking status: Never   Smokeless tobacco: Never  Substance and Sexual Activity   Alcohol use: No   Drug use: No   Sexual activity: Yes    Birth control/protection: None  Other Topics Concern   Not on file  Social History Narrative   3children, all live nearby.   Grandchildren and great grandchildren.      Right Handed    Social Determinants of Health   Financial Resource Strain: Low Risk  (05/11/2022)   Overall Financial Resource Strain (CARDIA)    Difficulty of Paying Living Expenses: Not hard at all  Food Insecurity: No Food Insecurity (05/11/2022)   Hunger Vital Sign    Worried About Running Out of Food in the Last Year: Never true    Ran Out of Food in the  Last Year: Never true  Transportation Needs: No Transportation Needs (05/11/2022)   PRAPARE - Administrator, Civil Service (Medical): No    Lack of Transportation (Non-Medical): No  Physical Activity: Insufficiently Active (05/11/2022)   Exercise Vital Sign    Days of Exercise per Week: 3 days    Minutes of Exercise per Session: 30 min  Stress: No Stress Concern Present (05/11/2022)   [employer] of Occupational Health - Occupational Stress Questionnaire    Feeling of Stress : Not at all  Social Connections: Moderately Integrated (05/11/2022)   Social Connection and Isolation Panel [NHANES]    Frequency of Communication with Friends and Family: More than three times a week    Frequency of Social Gatherings with Friends and Family: Three times a week    Attends Religious Services: More than 4 times per year    Active Member of Clubs or Organizations: No    Attends Banker Meetings: Never    Marital Status: Married    Review of Systems Per HPI  Objective:  BP 132/78   Pulse 60   Temp 97.7 F (36.5 C) (Temporal)   Wt 170 lb 6.4 oz (77.3 kg)   SpO2 97%   BMI 26.69 kg/m      12/24/2022   11:37 AM 12/05/2022  1:30 PM 12/05/2022    1:15 PM  BP/Weight  Systolic BP 132 123 125  Diastolic BP 78 83 64  Wt. (Lbs) 170.4    BMI 26.69 kg/m2      Physical Exam  Lab Results  Component Value Date   WBC 9.7 12/05/2022   HGB 12.8 (L) 12/05/2022   HCT 37.9 (L) 12/05/2022   PLT 245 12/05/2022   GLUCOSE 131 (H) 12/05/2022   CHOL 173 07/25/2022   TRIG 99 07/25/2022   HDL 64 07/25/2022   LDLCALC 91 07/25/2022   ALT 6 12/05/2022   AST 12 (L) 12/05/2022   NA 136 12/05/2022   K 3.8 12/05/2022   CL 104 12/05/2022   CREATININE 0.90 12/05/2022   BUN 23 12/05/2022   CO2 24 12/05/2022   TSH 3.819 09/05/2014   INR 0.90 06/02/2010   HGBA1C 6.7 (H) 07/25/2022   MICROALBUR 10 01/31/2016     Assessment & Plan:   Problem List Items Addressed This Visit        Cardiovascular and Mediastinum   Essential hypertension, benign    Stable on current medications.  Continue.        Nervous and Auditory   Parkinson's disease - Primary    Recent fall with negative workup.  Discussed using a cane and/or walker.  He has both of these at home.  Offered physical therapy.  Declines.       Follow-up:  3-4 months  Majesty Oehlert Adriana Simas DO [facility]

## 2022-12-24 NOTE — Patient Instructions (Signed)
Follow up at the beginning of the year.  Take care  Dr. Adriana Simas

## 2022-12-24 NOTE — Assessment & Plan Note (Signed)
Stable on current medications.  Continue. 

## 2022-12-24 NOTE — Assessment & Plan Note (Signed)
Recent fall with negative workup.  Discussed using a cane and/or walker.  He has both of these at home.  Offered physical therapy.  Declines.

## 2022-12-25 NOTE — Progress Notes (Unsigned)
Assessment/Plan:   1.  Parkinsons Disease, diagnosed October, 2022  - carbidopa/levodopa 25/100, 1.5 tablet 3 times per day.  -Discussed that I really would like him to get a physical therapy, especially given gait instability.  He doesn't want to do that.  -Discussed walker at all times.  I do not think that a cane is particularly useful for prevention of falls.  He initially was very resistant, that when I talk to him about the fact that I do not want him to be in a nursing home, he looked at his wife and stated that he would be willing to use a walker.  -encouraged them to attend the Parkinsons Disease exercise class in [place]  2.  Sialorrhea  -They want to hold on Myobloc.  Overall better since starting levodopa, but has some coming back.  -Noting some phlegm in the throat.  Unfortunately, this is associated with Parkinson's disease, but very little can help this.  -Failed Robinul  3.  Constipation  -Doing better in that regard  4.  PDD with associated hallucinations .  -Discussed Nuplazid again along with risks and benefits.  Patient initially wanted the medication, as stated hallucinations were bothersome.  Wife was not so interested and ultimately decided to hold on the medication.  We did discuss risks and benefits extensively.  Subjective:   Mark Brady was seen today in follow up for Parkinsons disease.  My previous records were reviewed prior to todays visit as well as outside records available to me.  Patient with wife who supplements history.  Unfortunately, the patient was in the emergency room on August 21 after a fall.  This occurred in the middle of the night.  He got up to use the bathroom somewhere between 3 and 4 AM and when his wife woke at 7 AM, she found that he was sitting on the floor in front of his bed.  Patient actually stated that he fell near the bathroom, but skidded on his buttocks to get back to the bed when his wife found him.  CT brain and  cervical spine were nonacute.  He did follow-up with primary care on September 9.  He had been using a cane after the fall.  He declined physical therapy.  Wife states that he fell since that time on the carpet - wife states that he said he was trying to get "around the tractor."  Wife states that he hallucinates nearly daily - "like the midgets that live in the trees behind the house."  Having some drooling, but mostly having phlegm in the mouth.  Current prescribed movement disorder medications: Carbidopa/levodopa 25/100, 1.5 tablet 3 times per day    PREVIOUS MEDICATIONS: Sinemet  ALLERGIES:   Allergies  Allergen Reactions   Penicillins    Aspirin Rash    CURRENT MEDICATIONS:  Current Meds  Medication Sig   Accu-Chek Softclix Lancets lancets USE AS DIRECTED ONCE DAILY   amLODipine (NORVASC) 10 MG tablet TAKE 1 TABLET(10 MG) BY MOUTH DAILY   carbidopa-levodopa (SINEMET IR) 25-100 MG tablet Take 1.5 tablets by mouth 3 (three) times daily.   cetirizine (ZYRTEC) 5 MG tablet Take 1 tablet (5 mg total) by mouth daily.   dorzolamide-timolol (COSOPT) 22.3-6.8 MG/ML ophthalmic solution Place 1 drop into both eyes daily.   famotidine (PEPCID) 40 MG tablet TAKE 1 TABLET(40 MG) BY MOUTH DAILY   glucose blood (ACCU-CHEK AVIVA PLUS) test strip USE TO CHECK BLOOD SUGAR DAILY   latanoprost (XALATAN) 0.005 %  ophthalmic solution SMARTSIG:1 Drop(s) In Eye(s) Every Evening   losartan (COZAAR) 100 MG tablet TAKE 1 TABLET(100 MG) BY MOUTH DAILY   mupirocin ointment (BACTROBAN) 2 % Apply 1 application topically daily as needed.   ondansetron (ZOFRAN) 4 MG tablet Take 1 tablet (4 mg total) by mouth every 8 (eight) hours as needed for nausea.   pantoprazole (PROTONIX) 40 MG tablet TAKE 1 TABLET(40 MG) BY MOUTH DAILY   pravastatin (PRAVACHOL) 40 MG tablet TAKE 1 TABLET(40 MG) BY MOUTH DAILY     Objective:   PHYSICAL EXAMINATION:    VITALS:   Vitals:   12/26/22 1045  BP: 130/70  Pulse: 78  SpO2:  98%  Weight: 170 lb (77.1 kg)  Height: 5\' 7"  (1.702 m)     GEN:  The patient appears stated age and is in NAD. HEENT:  Normocephalic, atraumatic.  The mucous membranes are moist. The superficial temporal arteries are without ropiness or tenderness.   Neurological examination:  Orientation: The patient is alert and oriented to person/place.  Looks to wife for finer aspects of the history. Cranial nerves: There is good facial symmetry with facial hypomimia. The speech is fluent and hypophonic.  Occasionally dysarthric.  Soft palate rises symmetrically and there is no tongue deviation. Hearing is decreased to conversational tone. Sensation: Sensation is intact to light touch throughout Motor: Strength is at least antigravity x4.  Movement examination: Tone: There is mild to mod increased tone in the RUE Abnormal movements: none Coordination:  There is decremation with RAM's, with any form of RAMS, including alternating supination and pronation of the forearm, hand opening and closing, finger taps, heel taps and toe taps, bilaterally, L slightly>R (slowness more than decremation, which is similar to last visit) Gait and Station: The patient has difficulty arising out of a deep-seated chair without the use of the hands.  He pushes off to arise but he is very slow.  He is very short stepped and very unstable.  He has a cane with him  I have reviewed and interpreted the following labs independently    Chemistry      Component Value Date/Time   NA 136 12/05/2022 1059   NA 141 07/25/2022 1044   K 3.8 12/05/2022 1059   CL 104 12/05/2022 1059   CO2 24 12/05/2022 1059   BUN 23 12/05/2022 1059   BUN 18 07/25/2022 1044   CREATININE 0.90 12/05/2022 1059   CREATININE 1.15 09/21/2013 0719      Component Value Date/Time   CALCIUM 8.8 (L) 12/05/2022 1059   ALKPHOS 49 12/05/2022 1059   AST 12 (L) 12/05/2022 1059   ALT 6 12/05/2022 1059   BILITOT 0.5 12/05/2022 1059   BILITOT 0.3 07/25/2022  1044       Lab Results  Component Value Date   WBC 9.7 12/05/2022   HGB 12.8 (L) 12/05/2022   HCT 37.9 (L) 12/05/2022   MCV 96.2 12/05/2022   PLT 245 12/05/2022    Lab Results  Component Value Date   TSH 3.819 09/05/2014    Total time spent on today's visit was 30 minutes, including both face-to-face time and nonface-to-face time.  Time included that spent on review of records (prior notes available to me/labs/imaging if pertinent), discussing treatment and goals, answering patient's questions and coordinating care.   Cc:  Tommie Sams, DO

## 2022-12-26 ENCOUNTER — Encounter: Payer: Self-pay | Admitting: Neurology

## 2022-12-26 ENCOUNTER — Ambulatory Visit: Payer: Medicare Other | Admitting: Neurology

## 2022-12-26 VITALS — BP 130/70 | HR 78 | Ht 67.0 in | Wt 170.0 lb

## 2022-12-26 DIAGNOSIS — G20A1 Parkinson's disease without dyskinesia, without mention of fluctuations: Secondary | ICD-10-CM | POA: Diagnosis not present

## 2022-12-26 DIAGNOSIS — F0280 Dementia in other diseases classified elsewhere without behavioral disturbance: Secondary | ICD-10-CM | POA: Diagnosis not present

## 2022-12-26 DIAGNOSIS — K117 Disturbances of salivary secretion: Secondary | ICD-10-CM | POA: Diagnosis not present

## 2022-12-26 NOTE — Patient Instructions (Signed)
USE A WALKER! Let me know if you would like Physical therapy!  SAVE THE DATE!  We are planning a Parkinsons Disease educational symposium at [facility] in [place] on October 11.  More details to come!  If you would like to be added to our email list to get further information, email [email or web address].  To sign up, you can email [email or web address].  We hope to see you there!

## 2022-12-30 NOTE — Progress Notes (Signed)
Subjective:  Patient ID: Mark Brady, male    DOB: Aug 26, 1929  Age: 89 y.o. MRN: [id number]  CC: No chief complaint on file.   HPI:  Patient Active Problem List   Diagnosis Date Noted   Parkinson's disease 07/24/2021   Sialorrhea 05/14/2020   Gastroesophageal reflux disease without esophagitis 05/13/2020   Glaucoma 11/23/2019   Diabetes (HCC) 10/19/2012   Essential hypertension, benign 09/28/2012   Hyperlipidemia 09/28/2012    Social Hx   Social History   Socioeconomic History   Marital status: Married    Spouse name: Bonita Quin   Number of children: 5   Years of education: Not on file   Highest education level: Not on file  Occupational History   Occupation: retired    Comment: tobacco and beef farmer  Tobacco Use   Smoking status: Never   Smokeless tobacco: Never  Substance and Sexual Activity   Alcohol use: No   Drug use: No   Sexual activity: Yes    Birth control/protection: None  Other Topics Concern   Not on file  Social History Narrative   3children, all live nearby.   Grandchildren and great grandchildren.      Right Handed    Social Determinants of Health   Financial Resource Strain: Low Risk  (05/11/2022)   Overall Financial Resource Strain (CARDIA)    Difficulty of Paying Living Expenses: Not hard at all  Food Insecurity: No Food Insecurity (05/11/2022)   Hunger Vital Sign    Worried About Running Out of Food in the Last Year: Never true    Ran Out of Food in the Last Year: Never true  Transportation Needs: No Transportation Needs (05/11/2022)   PRAPARE - Administrator, Civil Service (Medical): No    Lack of Transportation (Non-Medical): No  Physical Activity: Insufficiently Active (05/11/2022)   Exercise Vital Sign    Days of Exercise per Week: 3 days    Minutes of Exercise per Session: 30 min  Stress: No Stress Concern Present (05/11/2022)   [employer] of Occupational Health - Occupational Stress Questionnaire    Feeling  of Stress : Not at all  Social Connections: Moderately Integrated (05/11/2022)   Social Connection and Isolation Panel [NHANES]    Frequency of Communication with Friends and Family: More than three times a week    Frequency of Social Gatherings with Friends and Family: Three times a week    Attends Religious Services: More than 4 times per year    Active Member of Clubs or Organizations: No    Attends Banker Meetings: Never    Marital Status: Married    Review of Systems Per HPI  Objective:  BP 132/78   Pulse 60   Temp 97.7 F (36.5 C) (Temporal)   Wt 170 lb 6.4 oz (77.3 kg)   SpO2 97%   BMI 26.69 kg/m      12/26/2022   10:45 AM 12/24/2022   11:37 AM 12/05/2022    1:30 PM  BP/Weight  Systolic BP 130 132 123  Diastolic BP 70 78 83  Wt. (Lbs) 170 170.4   BMI 26.63 kg/m2 26.69 kg/m2     Physical Exam Constitutional:      General: He is not in acute distress.    Appearance: Normal appearance.  HENT:     Head: Normocephalic and atraumatic.  Cardiovascular:     Rate and Rhythm: Normal rate and regular rhythm.  Pulmonary:     Effort: Pulmonary  effort is normal.     Comments: Transmitted upper airway sounds. Neurological:     Mental Status: He is alert.     Lab Results  Component Value Date   WBC 9.7 12/05/2022   HGB 12.8 (L) 12/05/2022   HCT 37.9 (L) 12/05/2022   PLT 245 12/05/2022   GLUCOSE 131 (H) 12/05/2022   CHOL 173 07/25/2022   TRIG 99 07/25/2022   HDL 64 07/25/2022   LDLCALC 91 07/25/2022   ALT 6 12/05/2022   AST 12 (L) 12/05/2022   NA 136 12/05/2022   K 3.8 12/05/2022   CL 104 12/05/2022   CREATININE 0.90 12/05/2022   BUN 23 12/05/2022   CO2 24 12/05/2022   TSH 3.819 09/05/2014   INR 0.90 06/02/2010   HGBA1C 6.7 (H) 07/25/2022   MICROALBUR 10 01/31/2016     Assessment & Plan:   Problem List Items Addressed This Visit       Cardiovascular and Mediastinum   Essential hypertension, benign    Stable on current medications.   Continue.        Nervous and Auditory   Parkinson's disease - Primary    Recent fall with negative workup.  Discussed using a cane and/or walker.  He has both of these at home.  Offered physical therapy.  Declines.       No orders of the defined types were placed in this encounter.   Follow-up:  No follow-ups on file.  Everlene Other DO [facility]

## 2023-01-07 ENCOUNTER — Other Ambulatory Visit: Payer: Self-pay | Admitting: Family Medicine

## 2023-01-24 ENCOUNTER — Ambulatory Visit: Payer: Medicare Other | Admitting: Family Medicine

## 2023-01-31 ENCOUNTER — Other Ambulatory Visit: Payer: Self-pay | Admitting: Family Medicine

## 2023-02-25 ENCOUNTER — Other Ambulatory Visit: Payer: Self-pay | Admitting: Family Medicine

## 2023-03-16 ENCOUNTER — Other Ambulatory Visit: Payer: Self-pay | Admitting: Neurology

## 2023-03-16 DIAGNOSIS — K117 Disturbances of salivary secretion: Secondary | ICD-10-CM

## 2023-03-16 DIAGNOSIS — F0280 Dementia in other diseases classified elsewhere without behavioral disturbance: Secondary | ICD-10-CM

## 2023-03-16 DIAGNOSIS — G20A1 Parkinson's disease without dyskinesia, without mention of fluctuations: Secondary | ICD-10-CM

## 2023-03-21 ENCOUNTER — Ambulatory Visit: Payer: Medicare Other | Admitting: Nurse Practitioner

## 2023-03-21 ENCOUNTER — Encounter: Payer: Self-pay | Admitting: Nurse Practitioner

## 2023-03-21 VITALS — BP 137/62 | HR 64 | Temp 97.5°F | Ht 67.0 in | Wt 167.8 lb

## 2023-03-21 DIAGNOSIS — J069 Acute upper respiratory infection, unspecified: Secondary | ICD-10-CM | POA: Diagnosis not present

## 2023-03-21 DIAGNOSIS — J209 Acute bronchitis, unspecified: Secondary | ICD-10-CM | POA: Diagnosis not present

## 2023-03-21 MED ORDER — PROMETHAZINE-DM 6.25-15 MG/5ML PO SYRP
ORAL_SOLUTION | ORAL | 0 refills | Status: DC
Start: 2023-03-21 — End: 2023-08-21

## 2023-03-21 MED ORDER — DOXYCYCLINE HYCLATE 100 MG PO CAPS: 100.0000 mg | ORAL_CAPSULE | Freq: Two times a day (BID) | ORAL | 0 refills | Status: DC

## 2023-03-21 NOTE — Progress Notes (Signed)
   Subjective:    Patient ID: Mark Brady, male    DOB: 08/07/1929, 89 y.o.   MRN: [id number]  HPI Presents with his wife for complaints of cough and congestion over the past week.  Began with a cold.  Continues to have a lingering cough.  Has noticed some chest congestion.  Runny nose.  Congested cough.  No noted fever but has felt warm at times.  No ear pain.  Some sore throat.  Slight chest pain and tightness with cough.  Occasional wheezing.  Cough is worse at night.  Non-smoker.  Has tried Vicks vapor rub for his symptoms.  Taking fluids well.  Voiding normal limit.  HPI is mainly being provided by his wife.        Objective:   Physical Exam NAD.  Alert, cooperative.  TMs mild clear effusion, no erythema.  Pharynx clear and moist.  Neck supple with mild soft anterior cervical adenopathy.  Occasional harsh congested cough noted.  Lungs partially cleared after coughing.  Continues to have some faint coarse crackles through the lungs mainly on the right side.  No tachypnea.  Normal color. Today's Vitals   03/21/23 1558  BP: 137/62  Pulse: 64  Temp: (!) 97.5 F (36.4 C)  SpO2: 93%  Weight: 167 lb 12.8 oz (76.1 kg)  Height: 5\' 7"  (1.702 m)   Body mass index is 26.28 kg/m.        Assessment & Plan:  Acute bronchitis, unspecified organism  Acute URI Meds ordered this encounter  Medications   doxycycline (VIBRAMYCIN) 100 MG capsule    Sig: Take 1 capsule (100 mg total) by mouth 2 (two) times daily.    Dispense:  20 capsule    Refill:  0    Order Specific Question:   Supervising Provider    Answer:   Lilyan Punt A [[id number]]   promethazine-dextromethorphan (PROMETHAZINE-DM) 6.25-15 MG/5ML syrup    Sig: Take one tsp po at bedtime prn cough    Dispense:  118 mL    Refill:  0    Order Specific Question:   Supervising Provider    Answer:   Lilyan Punt A [[id number]]   Start doxycycline as directed. Delsym or Mucinex DM as directed for congestion and cough during the  day. Restart Promethazine DM as directed for nighttime.  His wife states he has taken this fine in the past.  Drowsiness precautions. Warning signs reviewed.  Call back in 4 to 5 days if no improvement, go to ED or urgent care in the meantime if worse.

## 2023-03-25 ENCOUNTER — Other Ambulatory Visit: Payer: Self-pay | Admitting: Family Medicine

## 2023-04-15 NOTE — Telephone Encounter (Signed)
---  Please advise, restlessness at night and cannot sleep well, please advise something he can take with parkinsons ---   Copied from CRM [id number]. Topic: Clinical - Medical Advice >> Apr 15, 2023 11:03 AM Herbert Seta B wrote: Reason for CRM: Patient wife calling because patient has Parkinsons and Dementia, very restless and cannot sleep at night. Wants to know if need appointment or if medication wife can give OTC.

## 2023-04-23 ENCOUNTER — Ambulatory Visit (INDEPENDENT_AMBULATORY_CARE_PROVIDER_SITE_OTHER): Payer: Medicare Other | Admitting: Family Medicine

## 2023-04-23 VITALS — BP 129/78 | HR 75 | Temp 97.9°F | Ht 67.0 in | Wt 166.0 lb

## 2023-04-23 DIAGNOSIS — G20A1 Parkinson's disease without dyskinesia, without mention of fluctuations: Secondary | ICD-10-CM | POA: Diagnosis not present

## 2023-04-23 DIAGNOSIS — F02A2 Dementia in other diseases classified elsewhere, mild, with psychotic disturbance: Secondary | ICD-10-CM

## 2023-04-23 MED ORDER — LORAZEPAM 0.5 MG PO TABS
0.2500 mg | ORAL_TABLET | Freq: Three times a day (TID) | ORAL | 0 refills | Status: DC | PRN
Start: 2023-04-23 — End: 2023-05-29

## 2023-04-23 NOTE — Assessment & Plan Note (Signed)
 PRN Ativan for anxiety/restlessness.  Discussed Nuplazid (Dr. Arbutus Leas discussed with wife and patient) and other antipsychotics.  They want to wait at this time.

## 2023-04-23 NOTE — Progress Notes (Signed)
 Subjective:  Patient ID: Mark Brady, male    DOB: 11-14-1929  Age: 89 y.o. MRN: [id number]  CC:  Restlessness   HPI:  89 year old male with Parkinson disease presents for evaluation of the above.  Wife states that he is quite anxious and restless at night.  Has difficulty sleeping.  She would like to discuss severe is anything we can do to open regarding this.  Sees neurology.  They have previously discussed treatment for Parkinson's disease psychosis.  They elected to not proceed with the medication.  Wife would like something that can be used as needed to help with anxiety and restlessness.  Will discuss today.  Patient Active Problem List   Diagnosis Date Noted   Parkinson's disease dementia (HCC) 04/23/2023   Parkinson's disease (HCC) 07/24/2021   Sialorrhea 05/14/2020   Gastroesophageal reflux disease without esophagitis 05/13/2020   Glaucoma 11/23/2019   Diabetes (HCC) 10/19/2012   Essential hypertension, benign 09/28/2012   Hyperlipidemia 09/28/2012    Social Hx   Social History   Socioeconomic History   Marital status: Married    Spouse name: Rock   Number of children: 5   Years of education: Not on file   Highest education level: Not on file  Occupational History   Occupation: retired    Comment: tobacco and beef farmer  Tobacco Use   Smoking status: Never   Smokeless tobacco: Never  Substance and Sexual Activity   Alcohol  use: No   Drug use: No   Sexual activity: Yes    Birth control/protection: None  Other Topics Concern   Not on file  Social History Narrative   3children, all live nearby.   Grandchildren and great grandchildren.      Right Handed    Social Drivers of Health   Financial Resource Strain: Low Risk  (05/11/2022)   Overall Financial Resource Strain (CARDIA)    Difficulty of Paying Living Expenses: Not hard at all  Food Insecurity: No Food Insecurity (05/11/2022)   Hunger Vital Sign    Worried About Running Out of Food in the  Last Year: Never true    Ran Out of Food in the Last Year: Never true  Transportation Needs: No Transportation Needs (05/11/2022)   PRAPARE - Administrator, Civil Service (Medical): No    Lack of Transportation (Non-Medical): No  Physical Activity: Insufficiently Active (05/11/2022)   Exercise Vital Sign    Days of Exercise per Week: 3 days    Minutes of Exercise per Session: 30 min  Stress: No Stress Concern Present (05/11/2022)   [employer] of Occupational Health - Occupational Stress Questionnaire    Feeling of Stress : Not at all  Social Connections: Moderately Integrated (05/11/2022)   Social Connection and Isolation Panel [NHANES]    Frequency of Communication with Friends and Family: More than three times a week    Frequency of Social Gatherings with Friends and Family: Three times a week    Attends Religious Services: More than 4 times per year    Active Member of Clubs or Organizations: No    Attends Banker Meetings: Never    Marital Status: Married    Review of Systems Per HPI  Objective:  BP 129/78   Pulse 75   Temp 97.9 F (36.6 C)   Ht 5' 7 (1.702 m)   Wt 166 lb (75.3 kg)   SpO2 97%   BMI 26.00 kg/m      04/23/2023  9:59 AM 04/23/2023    9:18 AM 03/21/2023    3:58 PM  BP/Weight  Systolic BP 129 149 137  Diastolic BP 78 72 62  Wt. (Lbs)  166 167.8  BMI  26 kg/m2 26.28 kg/m2    Physical Exam Constitutional:      General: He is not in acute distress. HENT:     Head: Normocephalic and atraumatic.  Eyes:     General:        Right eye: No discharge.        Left eye: No discharge.     Conjunctiva/sclera: Conjunctivae normal.  Cardiovascular:     Rate and Rhythm: Normal rate and regular rhythm.  Pulmonary:     Effort: Pulmonary effort is normal.     Breath sounds: Normal breath sounds.  Neurological:     Mental Status: He is alert.     Lab Results  Component Value Date   WBC 9.7 12/05/2022   HGB 12.8 (L)  12/05/2022   HCT 37.9 (L) 12/05/2022   PLT 245 12/05/2022   GLUCOSE 131 (H) 12/05/2022   CHOL 173 07/25/2022   TRIG 99 07/25/2022   HDL 64 07/25/2022   LDLCALC 91 07/25/2022   ALT 6 12/05/2022   AST 12 (L) 12/05/2022   NA 136 12/05/2022   K 3.8 12/05/2022   CL 104 12/05/2022   CREATININE 0.90 12/05/2022   BUN 23 12/05/2022   CO2 24 12/05/2022   TSH 3.819 09/05/2014   INR 0.90 06/02/2010   HGBA1C 6.7 (H) 07/25/2022   MICROALBUR 10 01/31/2016     Assessment & Plan:   Problem List Items Addressed This Visit       Nervous and Auditory   Parkinson's disease dementia (HCC) - Primary   PRN Ativan  for anxiety/restlessness.  Discussed Nuplazid (Dr. Evonnie discussed with wife and patient) and other antipsychotics.  They want to wait at this time.      Relevant Medications   LORazepam  (ATIVAN ) 0.5 MG tablet    Meds ordered this encounter  Medications   LORazepam  (ATIVAN ) 0.5 MG tablet    Sig: Take 0.5-1 tablets (0.25-0.5 mg total) by mouth every 8 (eight) hours as needed for anxiety.    Dispense:  30 tablet    Refill:  0    Follow-up: Has scheduled follow-up  Veryl Abril Bluford DO [facility]

## 2023-04-23 NOTE — Patient Instructions (Signed)
Medication as needed. ° °Take care ° °Dr. Durward Matranga  °

## 2023-05-17 ENCOUNTER — Ambulatory Visit (INDEPENDENT_AMBULATORY_CARE_PROVIDER_SITE_OTHER): Payer: Medicare Other

## 2023-05-17 VITALS — Ht 67.0 in | Wt 166.0 lb

## 2023-05-17 DIAGNOSIS — Z0000 Encounter for general adult medical examination without abnormal findings: Secondary | ICD-10-CM

## 2023-05-17 NOTE — Patient Instructions (Signed)
Mark Brady , Thank you for taking time to come for your Medicare Wellness Visit. I appreciate your ongoing commitment to your health goals. Please review the following plan we discussed and let me know if I can assist you in the future.   Referrals/Orders/Follow-Ups/Clinician Recommendations:  Next Medicare Annual Wellness Visit: May 29, 2024 at 9:20 am telephone visit  This is a list of the screening recommended for you and due dates:  Health Maintenance  Topic Date Due   DTaP/Tdap/Td vaccine (2 - Tdap) 08/10/2018   COVID-19 Vaccine (3 - Moderna risk series) 07/31/2019   Eye exam for diabetics  12/16/2022   Hemoglobin A1C  01/24/2023   Flu Shot  07/15/2023*   Complete foot exam   07/25/2023   Medicare Annual Wellness Visit  05/16/2024   Pneumonia Vaccine  Completed   HPV Vaccine  Aged Out   Zoster (Shingles) Vaccine  Discontinued  *Topic was postponed. The date shown is not the original due date.    Advanced directives: (In Chart) A copy of your advanced directives are scanned into your chart should your provider ever need it.  Next Medicare Annual Wellness Visit scheduled for next year: yes  Preventive Care 88 Years and Older, Male Preventive care refers to lifestyle choices and visits with your health care provider that can promote health and wellness. Preventive care visits are also called wellness exams. What can I expect for my preventive care visit? Counseling During your preventive care visit, your health care provider may ask about your: Medical history, including: Past medical problems. Family medical history. History of falls. Current health, including: Emotional well-being. Home life and relationship well-being. Sexual activity. Memory and ability to understand (cognition). Lifestyle, including: Alcohol, nicotine or tobacco, and drug use. Access to firearms. Diet, exercise, and sleep habits. Work and work Astronomer. Sunscreen use. Safety issues such  as seatbelt and bike helmet use. Physical exam Your health care provider will check your: Height and weight. These may be used to calculate your BMI (body mass index). BMI is a measurement that tells if you are at a healthy weight. Waist circumference. This measures the distance around your waistline. This measurement also tells if you are at a healthy weight and may help predict your risk of certain diseases, such as type 2 diabetes and high blood pressure. Heart rate and blood pressure. Body temperature. Skin for abnormal spots. What immunizations do I need?  Vaccines are usually given at various ages, according to a schedule. Your health care provider will recommend vaccines for you based on your age, medical history, and lifestyle or other factors, such as travel or where you work. What tests do I need? Screening Your health care provider may recommend screening tests for certain conditions. This may include: Lipid and cholesterol levels. Diabetes screening. This is done by checking your blood sugar (glucose) after you have not eaten for a while (fasting). Hepatitis C test. Hepatitis B test. HIV (human immunodeficiency virus) test. STI (sexually transmitted infection) testing, if you are at risk. Lung cancer screening. Colorectal cancer screening. Prostate cancer screening. Abdominal aortic aneurysm (AAA) screening. You may need this if you are a current or former smoker. Talk with your health care provider about your test results, treatment options, and if necessary, the need for more tests. Follow these instructions at home: Eating and drinking  Eat a diet that includes fresh fruits and vegetables, whole grains, lean protein, and low-fat dairy products. Limit your intake of foods with high amounts of  sugar, saturated fats, and salt. Take vitamin and mineral supplements as recommended by your health care provider. Do not drink alcohol if your health care provider tells you not to  drink. If you drink alcohol: Limit how much you have to 0-2 drinks a day. Know how much alcohol is in your drink. In the [place] one drink equals one 12 oz bottle of beer (355 mL), one 5 oz glass of wine (148 mL), or one 1 oz glass of hard liquor (44 mL). Lifestyle Brush your teeth every morning and night with fluoride toothpaste. Floss one time each day. Exercise for at least 30 minutes 5 or more days each week. Do not use any products that contain nicotine or tobacco. These products include cigarettes, chewing tobacco, and vaping devices, such as e-cigarettes. If you need help quitting, ask your health care provider. Do not use drugs. If you are sexually active, practice safe sex. Use a condom or other form of protection to prevent STIs. Take aspirin only as told by your health care provider. Make sure that you understand how much to take and what form to take. Work with your health care provider to find out whether it is safe and beneficial for you to take aspirin daily. Ask your health care provider if you need to take a cholesterol-lowering medicine (statin). Find healthy ways to manage stress, such as: Meditation, yoga, or listening to music. Journaling. Talking to a trusted person. Spending time with friends and family. Safety Always wear your seat belt while driving or riding in a vehicle. Do not drive: If you have been drinking alcohol. Do not ride with someone who has been drinking. When you are tired or distracted. While texting. If you have been using any mind-altering substances or drugs. Wear a helmet and other protective equipment during sports activities. If you have firearms in your house, make sure you follow all gun safety procedures. Minimize exposure to UV radiation to reduce your risk of skin cancer. What's next? Visit your health care provider once a year for an annual wellness visit. Ask your health care provider how often you should have your eyes and teeth  checked. Stay up to date on all vaccines. This information is not intended to replace advice given to you by your health care provider. Make sure you discuss any questions you have with your health care provider. Document Revised: 09/28/2020 Document Reviewed: 09/28/2020 Elsevier Patient Education  2024 [employer].  Understanding Your Risk for Falls Millions of people have serious injuries from falls each year. It is important to understand your risk of falling. Talk with your health care provider about your risk and what you can do to lower it. If you do have a serious fall, make sure to tell your provider. Falling once raises your risk of falling again. How can falls affect me? Serious injuries from falls are common. These include: Broken bones, such as hip fractures. Head injuries, such as traumatic brain injuries (TBI) or concussions. A fear of falling can cause you to avoid activities and stay at home. This can make your muscles weaker and raise your risk for a fall. What can increase my risk? There are a number of risk factors that increase your risk for falling. The more risk factors you have, the higher your risk of falling. Serious injuries from a fall happen most often to people who are older than 88 years old. Teenagers and young adults ages 68-29 are also at higher risk. Common risk factors  include: Weakness in the lower body. Being generally weak or confused due to long-term (chronic) illness. Dizziness or balance problems. Poor vision. Medicines that cause dizziness or drowsiness. These may include: Medicines for your blood pressure, heart, anxiety, insomnia, or swelling (edema). Pain medicines. Muscle relaxants. Other risk factors include: Drinking alcohol. Having had a fall in the past. Having foot pain or wearing improper footwear. Working at a dangerous job. Having any of the following in your home: Tripping hazards, such as floor clutter or loose rugs. Poor  lighting. Pets. Having dementia or memory loss. What actions can I take to lower my risk of falling?     Physical activity Stay physically fit. Do strength and balance exercises. Consider taking a regular class to build strength and balance. Yoga and tai chi are good options. Vision Have your eyes checked every year and your prescription for glasses or contacts updated as needed. Shoes and walking aids Wear non-skid shoes. Wear shoes that have rubber soles and low heels. Do not wear high heels. Do not walk around the house in socks or slippers. Use a cane or walker as told by your provider. Home safety Attach secure railings on both sides of your stairs. Install grab bars for your bathtub, shower, and toilet. Use a non-skid mat in your bathtub or shower. Attach bath mats securely with double-sided, non-slip rug tape. Use good lighting in all rooms. Keep a flashlight near your bed. Make sure there is a clear path from your bed to the bathroom. Use night-lights. Do not use throw rugs. Make sure all carpeting is taped or tacked down securely. Remove all clutter from walkways and stairways, including extension cords. Repair uneven or broken steps and floors. Avoid walking on icy or slippery surfaces. Walk on the grass instead of on icy or slick sidewalks. Use ice melter to get rid of ice on walkways in the winter. Use a cordless phone. Questions to ask your health care provider Can you help me check my risk for a fall? Do any of my medicines make me more likely to fall? Should I take a vitamin D supplement? What exercises can I do to improve my strength and balance? Should I make an appointment to have my vision checked? Do I need a bone density test to check for weak bones (osteoporosis)? Would it help to use a cane or a walker? Where to find more information Centers for Disease Control and Prevention, [employer]: TonerPromos.no Community-Based Fall Prevention Programs: TonerPromos.no [employer] on Aging: BaseRingTones.pl Contact a health care provider if: You fall at home. You are afraid of falling at home. You feel weak, drowsy, or dizzy. This information is not intended to replace advice given to you by your health care provider. Make sure you discuss any questions you have with your health care provider. Document Revised: 12/04/2021 Document Reviewed: 12/04/2021 Elsevier Patient Education  2024 [employer].

## 2023-05-17 NOTE — Progress Notes (Signed)
Please attest and cosign this visit due to patients primary care provider not being in the office at the time the visit was completed.  Because this visit was a virtual/telehealth visit,  certain criteria was not obtained, such a blood pressure, CBG if applicable, and timed get up and go. Any medications not marked as "taking" were not mentioned during the medication reconciliation part of the visit. Any vitals not documented were not able to be obtained due to this being a telehealth visit or patient was unable to self-report a recent blood pressure reading due to a lack of equipment at home via telehealth. Vitals that have been documented are verbally provided by the patient.  Interactive audio and video telecommunications were attempted between this provider and patient, however failed, due to patient having technical difficulties OR patient did not have access to video capability.  We continued and completed visit with audio only.  Patients wife stayed on telephone via extension to assist with answering questions and to make sure he understood what was being asked due to hearing difficulties  Subjective:   Mark Brady is a 89 y.o. male who presents for Medicare Annual/Subsequent preventive examination.  Visit Complete: Virtual I connected with  Mark Brady on 05/17/23 by a audio enabled telemedicine application and verified that I am speaking with the correct person using two identifiers.  Patient Location: Home  Provider Location: Home Office  I discussed the limitations of evaluation and management by telemedicine. The patient expressed understanding and agreed to proceed.  Vital Signs: Because this visit was a virtual/telehealth visit, some criteria may be missing or patient reported. Any vitals not documented were not able to be obtained and vitals that have been documented are patient reported.  Patient Medicare AWV questionnaire was completed by the patient on na; I have  confirmed that all information answered by patient is correct and no changes since this date.  Cardiac Risk Factors include: advanced age (>85men, >83 women);diabetes mellitus;dyslipidemia;hypertension;male gender;sedentary lifestyle     Objective:    Today's Vitals   05/17/23 1459  Weight: 166 lb (75.3 kg)  Height: 5\' 7"  (1.702 m)   Body mass index is 26 kg/m.     05/17/2023    3:02 PM 12/26/2022   10:45 AM 12/05/2022   10:05 AM 06/28/2022   10:47 AM 05/11/2022    2:54 PM 03/29/2022    8:53 PM 12/07/2021   12:53 PM  Advanced Directives  Does Patient Have a Medical Advance Directive? Yes Yes Yes Yes Yes No No  Type of Estate agent of [place];Living will Living will Healthcare Power of [place];Living will Healthcare Power of Attorney Living will;Healthcare Power of Attorney    Does patient want to make changes to medical advance directive? No - Patient declined   No - Patient declined No - Patient declined    Copy of Healthcare Power of Attorney in Chart? Yes - validated most recent copy scanned in chart (See row information)  No - copy requested No - copy requested Yes - validated most recent copy scanned in chart (See row information)    Would patient like information on creating a medical advance directive?      No - Patient declined     Current Medications (verified) Outpatient Encounter Medications as of 05/17/2023  Medication Sig   Accu-Chek Softclix Lancets lancets USE TO CHECK BLOOD SUGAR DAILY   amLODipine (NORVASC) 10 MG tablet TAKE 1 TABLET(10 MG) BY MOUTH DAILY   carbidopa-levodopa (  SINEMET IR) 25-100 MG tablet TAKE 1 AND 1/2 TABLETS BY MOUTH THREE TIMES DAILY   dorzolamide-timolol (COSOPT) 22.3-6.8 MG/ML ophthalmic solution Place 1 drop into both eyes daily.   glucose blood (ACCU-CHEK AVIVA PLUS) test strip USE TO CHECK BLOOD SUGAR DAILY   latanoprost (XALATAN) 0.005 % ophthalmic solution SMARTSIG:1 Drop(s) In Eye(s) Every Evening   LORazepam  (ATIVAN) 0.5 MG tablet Take 0.5-1 tablets (0.25-0.5 mg total) by mouth every 8 (eight) hours as needed for anxiety.   losartan (COZAAR) 100 MG tablet TAKE 1 TABLET(100 MG) BY MOUTH DAILY   pantoprazole (PROTONIX) 40 MG tablet TAKE 1 TABLET(40 MG) BY MOUTH DAILY   pravastatin (PRAVACHOL) 40 MG tablet TAKE 1 TABLET(40 MG) BY MOUTH DAILY   promethazine-dextromethorphan (PROMETHAZINE-DM) 6.25-15 MG/5ML syrup Take one tsp po at bedtime prn cough   doxycycline (VIBRAMYCIN) 100 MG capsule Take 1 capsule (100 mg total) by mouth 2 (two) times daily. (Patient not taking: Reported on 05/17/2023)   No facility-administered encounter medications on file as of 05/17/2023.    Allergies (verified) Penicillins and Aspirin   History: Past Medical History:  Diagnosis Date   Arthritis    Diabetes mellitus without complication (HCC)    diet controlled   History of elevated PSA    Hyperlipidemia    Hypertension    Stroke [facility])    Past Surgical History:  Procedure Laterality Date   CATARACT EXTRACTION W/PHACO Right 01/26/2014   Procedure: CATARACT EXTRACTION PHACO AND INTRAOCULAR LENS PLACEMENT (IOC);  Surgeon: Loraine Leriche T. Nile Riggs, MD;  Location: [facility];  Service: Ophthalmology;  Laterality: Right;  CDE 6.53   CATARACT EXTRACTION W/PHACO Left 02/09/2014   Procedure: CATARACT EXTRACTION PHACO AND INTRAOCULAR LENS PLACEMENT (IOC);  Surgeon: Loraine Leriche T. Nile Riggs, MD;  Location: [facility];  Service: Ophthalmology;  Laterality: Left;  CDE:5.58   COLONOSCOPY     YAG LASER APPLICATION Right 12/07/2014   Procedure: YAG LASER APPLICATION;  Surgeon: Jethro Bolus, MD;  Location: [facility];  Service: Ophthalmology;  Laterality: Right;   Family History  Problem Relation Age of Onset   Arthritis Other    Colon cancer Child    Heart failure Child    Social History   Socioeconomic History   Marital status: Married    Spouse name: Mark Brady   Number of children: 5   Years of education: Not on file   Highest education level: Not on  file  Occupational History   Occupation: retired    Comment: tobacco and beef farmer  Tobacco Use   Smoking status: Never   Smokeless tobacco: Never  Substance and Sexual Activity   Alcohol use: No   Drug use: No   Sexual activity: Yes    Birth control/protection: None  Other Topics Concern   Not on file  Social History Narrative   3children, all live nearby.   Grandchildren and great grandchildren.      Right Handed    Social Drivers of Health   Financial Resource Strain: Low Risk  (05/17/2023)   Overall Financial Resource Strain (CARDIA)    Difficulty of Paying Living Expenses: Not hard at all  Food Insecurity: No Food Insecurity (05/17/2023)   Hunger Vital Sign    Worried About Running Out of Food in the Last Year: Never true    Ran Out of Food in the Last Year: Never true  Transportation Needs: No Transportation Needs (05/17/2023)   PRAPARE - Administrator, Civil Service (Medical): No    Lack of Transportation (  Non-Medical): No  Physical Activity: Insufficiently Active (05/17/2023)   Exercise Vital Sign    Days of Exercise per Week: 3 days    Minutes of Exercise per Session: 30 min  Stress: No Stress Concern Present (05/17/2023)   [employer] of Occupational Health - Occupational Stress Questionnaire    Feeling of Stress : Not at all  Social Connections: Moderately Integrated (05/17/2023)   Social Connection and Isolation Panel [NHANES]    Frequency of Communication with Friends and Family: More than three times a week    Frequency of Social Gatherings with Friends and Family: More than three times a week    Attends Religious Services: More than 4 times per year    Active Member of [employer] or Organizations: No    Attends Engineer, structural: Never    Marital Status: Married    Tobacco Counseling Counseling given: Yes   Clinical Intake:  Pre-visit preparation completed: Yes  Pain : No/denies pain     BMI - recorded: 26 Nutritional  Status: BMI 25 -29 Overweight Nutritional Risks: None Diabetes: Yes CBG done?: No (telehealth visit.) Did pt. bring in CBG monitor from home?: No  How often do you need to have someone help you when you read instructions, pamphlets, or other written materials from your doctor or pharmacy?: 1 - Never  Interpreter Needed?: No  Information entered by :: Maryjean Ka CMA   Activities of Daily Living    05/17/2023    2:52 PM  In your present state of health, do you have any difficulty performing the following activities:  Hearing? 1  Comment wife had to help with visit due to patient being hard of hearing  Vision? 0  Difficulty concentrating or making decisions? 1  Comment mild cognitive impairment due to parkinsons dz  Walking or climbing stairs? 0  Dressing or bathing? 1  Comment wife helps him  Doing errands, shopping? 1  Comment unable to do errands alone. wife takes him  Quarry manager and eating ? Y  Comment unable to due to parkinsons disease  Using the Toilet? N  In the past six months, have you accidently leaked urine? N  Do you have problems with loss of bowel control? N  Managing your Medications? Y  Comment wife helps  Managing your Finances? Y  Comment wife helps  Housekeeping or managing your Housekeeping? Y  Comment wife helps    Patient Care Team: Tommie Sams, DO as PCP - General (Family Medicine) Tat, Octaviano Batty, DO as Consulting Physician (Neurology) [facility] (Ophthalmology)  Indicate any recent Medical Services you may have received from other than [facility] providers in the past year (date may be approximate).     Assessment:   This is a routine wellness examination for Mark Brady.  Hearing/Vision screen Hearing Screening - Comments:: Patient has a lot of difficulty hearing. Wife used phone extension during visit to relay questions and assist him with answers as needed.   Vision Screening - Comments:: Wears rx glasses - up to date with routine  eye exams  Sees Dr. Clelia Croft at [facility] in [place]    Goals Addressed             This Visit's Progress    Patient Stated       Remain active and healthy       Depression Screen    05/17/2023    2:55 PM 03/21/2023    4:08 PM 12/24/2022   11:44 AM 05/11/2022  2:52 PM 04/06/2022    9:24 AM 01/23/2022    9:59 AM 02/02/2021    1:23 PM  PHQ 2/9 Scores  PHQ - 2 Score 0 0 0 0 0 0 0  PHQ- 9 Score 0 0 0        Fall Risk    05/17/2023    3:03 PM 03/21/2023    4:08 PM 12/26/2022   10:45 AM 12/24/2022   11:44 AM 06/28/2022   10:46 AM  Fall Risk   Falls in the past year? 1 1 1 1  0  Number falls in past yr: 1 1 1   0  Injury with Fall? 1 1 0  0  Risk for fall due to : History of fall(s);Impaired balance/gait;Orthopedic patient;Impaired mobility;Other (Comment)      Risk for fall due to: Comment Parkinsons Disease      Follow up Education provided;Falls prevention discussed  Falls evaluation completed  Falls evaluation completed    MEDICARE RISK AT HOME: Medicare Risk at Home Any stairs in or around the home?: Yes If so, are there any without handrails?: No Home free of loose throw rugs in walkways, pet beds, electrical cords, etc?: Yes Adequate lighting in your home to reduce risk of falls?: Yes Life alert?: No Use of a cane, walker or w/c?: Yes Grab bars in the bathroom?: Yes Shower chair or bench in shower?: No (wife assists with bathing patient) Elevated toilet seat or a handicapped toilet?: Yes  TIMED UP AND GO:  Was the test performed?  No    Cognitive Function:    05/17/2023    3:04 PM  MMSE - Mini Mental State Exam  Not completed: Unable to complete        05/11/2022    2:55 PM  6CIT Screen  What Year? 0 points  What month? 0 points  What time? 0 points  Count back from 20 0 points  Months in reverse 2 points  Repeat phrase 4 points  Total Score 6 points    Immunizations Immunization History  Administered Date(s) Administered   Fluad  Quad(high Dose 65+) 02/02/2021   Influenza,inj,Quad PF,6+ Mos 02/07/2016, 02/06/2017, 02/03/2018, 01/10/2019   Influenza-Unspecified 03/08/2020   Moderna Sars-Covid-2 Vaccination 06/12/2019, 07/03/2019   Pneumococcal Conjugate-13 08/02/2014   Pneumococcal Polysaccharide-23 12/16/2011   Td 08/09/2008    TDAP status: Due, Education has been provided regarding the importance of this vaccine. Advised may receive this vaccine at local pharmacy or Health Dept. Aware to provide a copy of the vaccination record if obtained from local pharmacy or Health Dept. Verbalized acceptance and understanding.  Flu Vaccine status: Declined, Education has been provided regarding the importance of this vaccine but patient still declined. Advised may receive this vaccine at local pharmacy or Health Dept. Aware to provide a copy of the vaccination record if obtained from local pharmacy or Health Dept. Verbalized acceptance and understanding.  Pneumococcal vaccine status: Up to date  Covid-19 vaccine status: Information provided on how to obtain vaccines.   Qualifies for Shingles Vaccine? No      Screening Tests Health Maintenance  Topic Date Due   DTaP/Tdap/Td (2 - Tdap) 08/10/2018   COVID-19 Vaccine (3 - Moderna risk series) 07/31/2019   OPHTHALMOLOGY EXAM  12/16/2022   HEMOGLOBIN A1C  01/24/2023   Medicare Annual Wellness (AWV)  05/12/2023   INFLUENZA VACCINE  07/15/2023 (Originally 11/15/2022)   FOOT EXAM  07/25/2023   Pneumonia Vaccine 1+ Years old  Completed   HPV VACCINES  Aged  Out   Zoster Vaccines- Shingrix  Discontinued    Health Maintenance  Health Maintenance Due  Topic Date Due   DTaP/Tdap/Td (2 - Tdap) 08/10/2018   COVID-19 Vaccine (3 - Moderna risk series) 07/31/2019   OPHTHALMOLOGY EXAM  12/16/2022   HEMOGLOBIN A1C  01/24/2023   Medicare Annual Wellness (AWV)  05/12/2023    Colorectal cancer screening: No longer required.   Lung Cancer Screening: (Low Dose CT Chest recommended  if Age 7-80 years, 20 pack-year currently smoking OR have quit w/in 15years.) does not qualify.   Lung Cancer Screening Referral: na  Additional Screening:  Hepatitis C Screening: does not qualify   Vision Screening: Recommended annual ophthalmology exams for early detection of glaucoma and other disorders of the eye. Is the patient up to date with their annual eye exam?  Yes  Who is the provider or what is the name of the office in which the patient attends annual eye exams? [facility], Dr Sherryll Burger If pt is not established with a provider, would they like to be referred to a provider to establish care? No .   Dental Screening: Recommended annual dental exams for proper oral hygiene  Diabetic Foot Exam: Diabetic Foot Exam: Completed 07/25/2022  Community Resource Referral / Chronic Care Management: CRR required this visit?  No   CCM required this visit?  No     Plan:     I have personally reviewed and noted the following in the patient's chart:   Medical and social history Use of alcohol, tobacco or illicit drugs  Current medications and supplements including opioid prescriptions. Patient is not currently taking opioid prescriptions. Functional ability and status Nutritional status Physical activity Advanced directives List of other physicians Hospitalizations, surgeries, and ER visits in previous 12 months Vitals Screenings to include cognitive, depression, and falls Referrals and appointments  In addition, I have reviewed and discussed with patient certain preventive protocols, quality metrics, and best practice recommendations. A written personalized care plan for preventive services as well as general preventive health recommendations were provided to patient.     Jordan Hawks Raizy Auzenne, CMA   05/17/2023   After Visit Summary: (Mail) Due to this being a telephonic visit, the after visit summary with patients personalized plan was offered to patient via mail    Nurse Notes:

## 2023-05-29 ENCOUNTER — Ambulatory Visit: Payer: Medicare Other | Admitting: Family Medicine

## 2023-05-29 DIAGNOSIS — I10 Essential (primary) hypertension: Secondary | ICD-10-CM | POA: Diagnosis not present

## 2023-05-29 DIAGNOSIS — E119 Type 2 diabetes mellitus without complications: Secondary | ICD-10-CM

## 2023-05-29 DIAGNOSIS — G20A1 Parkinson's disease without dyskinesia, without mention of fluctuations: Secondary | ICD-10-CM | POA: Diagnosis not present

## 2023-05-29 DIAGNOSIS — F02A2 Dementia in other diseases classified elsewhere, mild, with psychotic disturbance: Secondary | ICD-10-CM | POA: Diagnosis not present

## 2023-05-29 DIAGNOSIS — E1169 Type 2 diabetes mellitus with other specified complication: Secondary | ICD-10-CM

## 2023-05-29 MED ORDER — LORAZEPAM 0.5 MG PO TABS
0.2500 mg | ORAL_TABLET | Freq: Three times a day (TID) | ORAL | 3 refills | Status: DC | PRN
Start: 2023-05-29 — End: 2023-08-23

## 2023-05-29 NOTE — Patient Instructions (Signed)
Medication refilled  Follow up in 3-6 months

## 2023-05-29 NOTE — Progress Notes (Signed)
Subjective:  Patient ID: Mark Brady, male    DOB: 1929-12-29  Age: 89 y.o. MRN: [id number]  CC:   Chief Complaint  Patient presents with   Follow-up    Dm, htn, parkinsons, falls - momst recent fall Sunday has bruise on back     HPI:  89 year old male with Parkinson's disease, hypertension, type 2 diabetes, hyperlipidemia presents for follow-up.  Wife states that overall he is doing okay.  He still falls fairly frequently.  She finds it difficult to get him to use his cane or walker on a consistent basis.  Hypertension stable on amlodipine and losartan.  Wife states that Ativan has helped significantly regarding anxiety and restlessness.  She would like to continue.  Needs refill.  She is aware that this increases risk of falls.  Diabetes has been stable.  He is in need of labs.  After discussion today, we will wait until his next visit for labs.  Patient Active Problem List   Diagnosis Date Noted   Parkinson's disease dementia (HCC) 04/23/2023   Parkinson's disease (HCC) 07/24/2021   Sialorrhea 05/14/2020   Gastroesophageal reflux disease without esophagitis 05/13/2020   Glaucoma 11/23/2019   Diabetes (HCC) 10/19/2012   Essential hypertension, benign 09/28/2012   Hyperlipidemia 09/28/2012    Social Hx   Social History   Socioeconomic History   Marital status: Married    Spouse name: Bonita Quin   Number of children: 5   Years of education: Not on file   Highest education level: Not on file  Occupational History   Occupation: retired    Comment: tobacco and beef farmer  Tobacco Use   Smoking status: Never   Smokeless tobacco: Never  Substance and Sexual Activity   Alcohol use: No   Drug use: No   Sexual activity: Yes    Birth control/protection: None  Other Topics Concern   Not on file  Social History Narrative   3children, all live nearby.   Grandchildren and great grandchildren.      Right Handed    Social Drivers of Health   Financial Resource  Strain: Low Risk  (05/17/2023)   Overall Financial Resource Strain (CARDIA)    Difficulty of Paying Living Expenses: Not hard at all  Food Insecurity: No Food Insecurity (05/17/2023)   Hunger Vital Sign    Worried About Running Out of Food in the Last Year: Never true    Ran Out of Food in the Last Year: Never true  Transportation Needs: No Transportation Needs (05/17/2023)   PRAPARE - Administrator, Civil Service (Medical): No    Lack of Transportation (Non-Medical): No  Physical Activity: Insufficiently Active (05/17/2023)   Exercise Vital Sign    Days of Exercise per Week: 3 days    Minutes of Exercise per Session: 30 min  Stress: No Stress Concern Present (05/17/2023)   [employer] of Occupational Health - Occupational Stress Questionnaire    Feeling of Stress : Not at all  Social Connections: Moderately Integrated (05/17/2023)   Social Connection and Isolation Panel [NHANES]    Frequency of Communication with Friends and Family: More than three times a week    Frequency of Social Gatherings with Friends and Family: More than three times a week    Attends Religious Services: More than 4 times per year    Active Member of [employer] or Organizations: No    Attends Banker Meetings: Never    Marital Status: Married  Review of Systems Per HPI  Objective:  BP 135/75   Pulse 73   Temp (!) 97 F (36.1 C)   Wt 165 lb (74.8 kg)   SpO2 98%   BMI 25.84 kg/m      05/29/2023   10:44 AM 05/17/2023    2:59 PM 04/23/2023    9:59 AM  BP/Weight  Systolic BP 135 -- 129  Diastolic BP 75 -- 78  Wt. (Lbs) 165 166   BMI 25.84 kg/m2 26 kg/m2     Physical Exam Vitals and nursing note reviewed.  Constitutional:      General: He is not in acute distress.    Appearance: Normal appearance.  HENT:     Head: Normocephalic and atraumatic.  Cardiovascular:     Rate and Rhythm: Normal rate and regular rhythm.  Pulmonary:     Effort: Pulmonary effort is normal.      Breath sounds: Normal breath sounds.  Neurological:     Mental Status: He is alert. Mental status is at baseline.     Lab Results  Component Value Date   WBC 9.7 12/05/2022   HGB 12.8 (L) 12/05/2022   HCT 37.9 (L) 12/05/2022   PLT 245 12/05/2022   GLUCOSE 131 (H) 12/05/2022   CHOL 173 07/25/2022   TRIG 99 07/25/2022   HDL 64 07/25/2022   LDLCALC 91 07/25/2022   ALT 6 12/05/2022   AST 12 (L) 12/05/2022   NA 136 12/05/2022   K 3.8 12/05/2022   CL 104 12/05/2022   CREATININE 0.90 12/05/2022   BUN 23 12/05/2022   CO2 24 12/05/2022   TSH 3.819 09/05/2014   INR 0.90 06/02/2010   HGBA1C 6.7 (H) 07/25/2022   MICROALBUR 10 01/31/2016     Assessment & Plan:   Problem List Items Addressed This Visit       Cardiovascular and Mediastinum   Essential hypertension, benign   Stable.  Continue losartan and amlodipine.        Endocrine   Diabetes (HCC)   Stable.  Most recent CBG was in the 130s.  Planning for labs at follow-up.        Nervous and Auditory   Parkinson's disease dementia (HCC)   Continue Ativan as needed.  Refilled today.      Relevant Medications   LORazepam (ATIVAN) 0.5 MG tablet    Meds ordered this encounter  Medications   LORazepam (ATIVAN) 0.5 MG tablet    Sig: Take 0.5-1 tablets (0.25-0.5 mg total) by mouth every 8 (eight) hours as needed for anxiety.    Dispense:  30 tablet    Refill:  3    Follow-up:  3-6 months  Bland Rudzinski Adriana Simas DO [facility]

## 2023-05-29 NOTE — Assessment & Plan Note (Signed)
Continue Ativan as needed.  Refilled today.

## 2023-05-29 NOTE — Assessment & Plan Note (Signed)
Stable.  Continue losartan and amlodipine.

## 2023-05-29 NOTE — Assessment & Plan Note (Signed)
Stable.  Most recent CBG was in the 130s.  Planning for labs at follow-up.

## 2023-06-14 ENCOUNTER — Other Ambulatory Visit: Payer: Self-pay | Admitting: Neurology

## 2023-06-14 DIAGNOSIS — K117 Disturbances of salivary secretion: Secondary | ICD-10-CM

## 2023-06-14 DIAGNOSIS — F0280 Dementia in other diseases classified elsewhere without behavioral disturbance: Secondary | ICD-10-CM

## 2023-06-14 DIAGNOSIS — G20A1 Parkinson's disease without dyskinesia, without mention of fluctuations: Secondary | ICD-10-CM

## 2023-06-23 ENCOUNTER — Other Ambulatory Visit: Payer: Self-pay | Admitting: Family Medicine

## 2023-07-05 NOTE — Progress Notes (Signed)
 Assessment/Plan:   1.  Parkinsons Disease, diagnosed October, 2022  - increase carbidopa/levodopa 25/100, 2 tablet 3 times per day.  -Discussed that I really would like him to get a physical therapy, especially given gait instability.  He doesn't want to do that.  -Discussed walker at all times.  We have discussed this the last many visits.  -encouraged them to attend the Parkinsons Disease exercise class in [place]  2.  Sialorrhea  -They want to hold on Myobloc.  Overall better since starting levodopa, but has some coming back.  -Noting some phlegm in the throat.  Unfortunately, this is associated with Parkinson's disease, but very little can help this.  -Failed Robinul  3.  Constipation  -Doing better in that regard  4.  PDD with associated hallucinations .  -Discussed Nuplazid again along with risks and benefits.  Patient initially wanted the medication, as stated hallucinations were bothersome.  Wife was not so interested and ultimately decided to hold on the medication.  We did discuss risks and benefits extensively.  -wife asks about ativan and I told her that she needed to f/u with pcp, as he RX that.  I generally dont recommend in Parkinsons Disease population but wife reports helping with agitation in late afternoon.  Discussed with her to make sure that they are with him/using a walker if use it late afternoon.  Subjective:    Mark Brady was seen today in follow up for Parkinsons disease.  My previous records were reviewed prior to todays visit as well as outside records available to me.  Patient with wife who supplements history.  Patient has had falls. He doesn't want to use walker.   He has not had any major injuries but family does have to help him get up.  He does not want to go to physical therapy.  Patient does still have some hallucinations.  He sees hallucinations a few times per week at least - sees babies in the house - "I don't like them."  Does not want  Nuplazid - "I don't want more medication."  He does take 1 lorazepam daily for anxiety.  Current prescribed movement disorder medications: Carbidopa/levodopa 25/100, 1.5 tablet 3 times per day    PREVIOUS MEDICATIONS: Sinemet  ALLERGIES:   Allergies  Allergen Reactions   Penicillins    Aspirin Rash    CURRENT MEDICATIONS:  Current Meds  Medication Sig   Accu-Chek Softclix Lancets lancets USE TO CHECK BLOOD SUGAR DAILY   amLODipine (NORVASC) 10 MG tablet TAKE 1 TABLET(10 MG) BY MOUTH DAILY   carbidopa-levodopa (SINEMET IR) 25-100 MG tablet TAKE 1 AND 1/2 TABLETS BY MOUTH THREE TIMES DAILY   dorzolamide-timolol (COSOPT) 22.3-6.8 MG/ML ophthalmic solution Place 1 drop into both eyes daily.   glucose blood (ACCU-CHEK AVIVA PLUS) test strip USE TO CHECK BLOOD SUGAR DAILY   latanoprost (XALATAN) 0.005 % ophthalmic solution SMARTSIG:1 Drop(s) In Eye(s) Every Evening   LORazepam (ATIVAN) 0.5 MG tablet Take 0.5-1 tablets (0.25-0.5 mg total) by mouth every 8 (eight) hours as needed for anxiety.   losartan (COZAAR) 100 MG tablet TAKE 1 TABLET(100 MG) BY MOUTH DAILY   pantoprazole (PROTONIX) 40 MG tablet TAKE 1 TABLET(40 MG) BY MOUTH DAILY   pravastatin (PRAVACHOL) 40 MG tablet TAKE 1 TABLET(40 MG) BY MOUTH DAILY   promethazine-dextromethorphan (PROMETHAZINE-DM) 6.25-15 MG/5ML syrup Take one tsp po at bedtime prn cough     Objective:   PHYSICAL EXAMINATION:    VITALS:   Vitals:  07/09/23 0901  BP: 132/70  Pulse: 73  SpO2: 99%  Weight: 164 lb 12.8 oz (74.8 kg)    GEN:  The patient appears stated age and is in NAD. HEENT:  Normocephalic, atraumatic.  The mucous membranes are moist. The superficial temporal arteries are without ropiness or tenderness. CV: RRR Lungs:  CTAB  Neurological examination:  Orientation: The patient is alert and oriented to person/place.  Looks to wife for finer aspects of the history. Cranial nerves: There is good facial symmetry with facial hypomimia.  The speech is fluent and hypophonic.  Occasionally dysarthric.  Soft palate rises symmetrically and there is no tongue deviation. Hearing is decreased to conversational tone. Sensation: Sensation is intact to light touch throughout Motor: Strength is at least antigravity x4.  Movement examination: Tone: There is mild to mod increased tone in the RUE Abnormal movements: none Coordination:  There is decremation with RAM's, with any form of RAMS, including alternating supination and pronation of the forearm, hand opening and closing, finger taps, heel taps and toe taps, bilaterally, Mark slightly>R (slowness more than decremation, which is similar to last visit) Gait and Station: The patient has difficulty arising out of a deep-seated chair without the use of the hands.  He pushes off to arise and requires assist out of the chair.  He is very short stepped and very unstable and holds onto the examiner.  He has a cane with him.  He drags the Mark leg and is antagic  I have reviewed and interpreted the following labs independently    Chemistry      Component Value Date/Time   NA 136 12/05/2022 1059   NA 141 07/25/2022 1044   K 3.8 12/05/2022 1059   CL 104 12/05/2022 1059   CO2 24 12/05/2022 1059   BUN 23 12/05/2022 1059   BUN 18 07/25/2022 1044   CREATININE 0.90 12/05/2022 1059   CREATININE 1.15 09/21/2013 0719      Component Value Date/Time   CALCIUM 8.8 (Mark) 12/05/2022 1059   ALKPHOS 49 12/05/2022 1059   AST 12 (Mark) 12/05/2022 1059   ALT 6 12/05/2022 1059   BILITOT 0.5 12/05/2022 1059   BILITOT 0.3 07/25/2022 1044      Lab Results  Component Value Date   WBC 9.7 12/05/2022   HGB 12.8 (Mark) 12/05/2022   HCT 37.9 (Mark) 12/05/2022   MCV 96.2 12/05/2022   PLT 245 12/05/2022    Lab Results  Component Value Date   TSH 3.819 09/05/2014    Total time spent on today's visit was 30 minutes, including both face-to-face time and nonface-to-face time.  Time included that spent on review of  records (prior notes available to me/labs/imaging if pertinent), discussing treatment and goals, answering patient's questions and coordinating care.   Cc:  Tommie Sams, DO

## 2023-07-09 ENCOUNTER — Encounter: Payer: Self-pay | Admitting: Neurology

## 2023-07-09 ENCOUNTER — Ambulatory Visit: Payer: Medicare Other | Admitting: Neurology

## 2023-07-09 DIAGNOSIS — K117 Disturbances of salivary secretion: Secondary | ICD-10-CM | POA: Diagnosis not present

## 2023-07-09 DIAGNOSIS — F0280 Dementia in other diseases classified elsewhere without behavioral disturbance: Secondary | ICD-10-CM

## 2023-07-09 DIAGNOSIS — G20A1 Parkinson's disease without dyskinesia, without mention of fluctuations: Secondary | ICD-10-CM

## 2023-07-09 MED ORDER — CARBIDOPA-LEVODOPA 25-100 MG PO TABS: 2.0000 | ORAL_TABLET | Freq: Three times a day (TID) | ORAL | 1 refills | Status: DC

## 2023-07-29 ENCOUNTER — Other Ambulatory Visit: Payer: Self-pay

## 2023-07-29 ENCOUNTER — Other Ambulatory Visit: Payer: Self-pay | Admitting: Family Medicine

## 2023-07-29 MED ORDER — AMLODIPINE BESYLATE 10 MG PO TABS: 10.0000 mg | ORAL_TABLET | Freq: Every day | ORAL | 3 refills | Status: DC

## 2023-08-21 ENCOUNTER — Emergency Department (HOSPITAL_COMMUNITY)

## 2023-08-21 ENCOUNTER — Encounter (HOSPITAL_COMMUNITY): Payer: Self-pay | Admitting: *Deleted

## 2023-08-21 ENCOUNTER — Observation Stay (HOSPITAL_COMMUNITY)
Admission: EM | Admit: 2023-08-21 | Discharge: 2023-08-23 | Disposition: A | Discharge status: Skilled Nursing Facility | Attending: Internal Medicine | Admitting: Internal Medicine

## 2023-08-21 ENCOUNTER — Other Ambulatory Visit: Payer: Self-pay

## 2023-08-21 DIAGNOSIS — W1800XA Striking against unspecified object with subsequent fall, initial encounter: Secondary | ICD-10-CM | POA: Diagnosis not present

## 2023-08-21 DIAGNOSIS — E119 Type 2 diabetes mellitus without complications: Secondary | ICD-10-CM | POA: Insufficient documentation

## 2023-08-21 DIAGNOSIS — D72829 Elevated white blood cell count, unspecified: Secondary | ICD-10-CM | POA: Diagnosis not present

## 2023-08-21 DIAGNOSIS — S299XXA Unspecified injury of thorax, initial encounter: Secondary | ICD-10-CM | POA: Diagnosis present

## 2023-08-21 DIAGNOSIS — H40059 Ocular hypertension, unspecified eye: Secondary | ICD-10-CM | POA: Insufficient documentation

## 2023-08-21 DIAGNOSIS — Z8669 Personal history of other diseases of the nervous system and sense organs: Secondary | ICD-10-CM | POA: Diagnosis not present

## 2023-08-21 DIAGNOSIS — Z8673 Personal history of transient ischemic attack (TIA), and cerebral infarction without residual deficits: Secondary | ICD-10-CM | POA: Diagnosis not present

## 2023-08-21 DIAGNOSIS — I10 Essential (primary) hypertension: Secondary | ICD-10-CM | POA: Insufficient documentation

## 2023-08-21 DIAGNOSIS — G20C Parkinsonism, unspecified: Secondary | ICD-10-CM | POA: Diagnosis not present

## 2023-08-21 DIAGNOSIS — E785 Hyperlipidemia, unspecified: Secondary | ICD-10-CM | POA: Insufficient documentation

## 2023-08-21 DIAGNOSIS — S2241XA Multiple fractures of ribs, right side, initial encounter for closed fracture: Principal | ICD-10-CM

## 2023-08-21 DIAGNOSIS — Z7982 Long term (current) use of aspirin: Secondary | ICD-10-CM | POA: Insufficient documentation

## 2023-08-21 DIAGNOSIS — S2249XA Multiple fractures of ribs, unspecified side, initial encounter for closed fracture: Secondary | ICD-10-CM | POA: Diagnosis present

## 2023-08-21 DIAGNOSIS — K219 Gastro-esophageal reflux disease without esophagitis: Secondary | ICD-10-CM | POA: Insufficient documentation

## 2023-08-21 DIAGNOSIS — W19XXXA Unspecified fall, initial encounter: Principal | ICD-10-CM

## 2023-08-21 DIAGNOSIS — Z79899 Other long term (current) drug therapy: Secondary | ICD-10-CM | POA: Insufficient documentation

## 2023-08-21 LAB — CBC WITH DIFFERENTIAL/PLATELET
Abs Immature Granulocytes: 0.08 10*3/uL — ABNORMAL HIGH (ref 0.00–0.07)
Basophils Absolute: 0 10*3/uL (ref 0.0–0.1)
Basophils Relative: 0 %
Eosinophils Absolute: 0.2 10*3/uL (ref 0.0–0.5)
Eosinophils Relative: 1 %
HCT: 40.8 % (ref 39.0–52.0)
Hemoglobin: 14.1 g/dL (ref 13.0–17.0)
Immature Granulocytes: 1 %
Lymphocytes Relative: 11 %
Lymphs Abs: 1.4 10*3/uL (ref 0.7–4.0)
MCH: 33.3 pg (ref 26.0–34.0)
MCHC: 34.6 g/dL (ref 30.0–36.0)
MCV: 96.5 fL (ref 80.0–100.0)
Monocytes Absolute: 1 10*3/uL (ref 0.1–1.0)
Monocytes Relative: 8 %
Neutro Abs: 10 10*3/uL — ABNORMAL HIGH (ref 1.7–7.7)
Neutrophils Relative %: 79 %
Platelets: 371 10*3/uL (ref 150–400)
RBC: 4.23 MIL/uL (ref 4.22–5.81)
RDW: 13 % (ref 11.5–15.5)
WBC: 12.8 10*3/uL — ABNORMAL HIGH (ref 4.0–10.5)
nRBC: 0 % (ref 0.0–0.2)

## 2023-08-21 LAB — COMPREHENSIVE METABOLIC PANEL WITH GFR
ALT: 14 U/L (ref 0–44)
AST: 15 U/L (ref 15–41)
Albumin: 4.1 g/dL (ref 3.5–5.0)
Alkaline Phosphatase: 69 U/L (ref 38–126)
Anion gap: 9 (ref 5–15)
BUN: 22 mg/dL (ref 8–23)
CO2: 23 mmol/L (ref 22–32)
Calcium: 9.2 mg/dL (ref 8.9–10.3)
Chloride: 104 mmol/L (ref 98–111)
Creatinine, Ser: 0.91 mg/dL (ref 0.61–1.24)
GFR, Estimated: >60 mL/min (ref >60)
Glucose, Bld: 140 mg/dL — ABNORMAL HIGH (ref 70–99)
Potassium: 4 mmol/L (ref 3.5–5.1)
Sodium: 136 mmol/L (ref 135–145)
Total Bilirubin: 0.5 mg/dL (ref 0.0–1.2)
Total Protein: 7.5 g/dL (ref 6.5–8.1)

## 2023-08-21 LAB — VITAMIN B12: Vitamin B-12: 196 pg/mL (ref 180–914)

## 2023-08-21 LAB — TSH: TSH: 4.136 u[IU]/mL (ref 0.350–4.500)

## 2023-08-21 LAB — CK: Total CK: 78 U/L (ref 49–397)

## 2023-08-21 MED ORDER — LORAZEPAM 0.5 MG PO TABS
0.2500 mg | ORAL_TABLET | Freq: Three times a day (TID) | ORAL | Status: DC | PRN
  Administered 2023-08-21: 0.25 mg via ORAL
  Filled 2023-08-21: qty 1

## 2023-08-21 MED ORDER — LIDOCAINE 5 % EX PTCH
1.0000 | MEDICATED_PATCH | CUTANEOUS | Status: DC
  Administered 2023-08-21 – 2023-08-23 (×3): 1 via TRANSDERMAL
  Filled 2023-08-21 (×3): qty 1

## 2023-08-21 MED ORDER — ACETAMINOPHEN 325 MG PO TABS: 650.0000 mg | ORAL_TABLET | Freq: Four times a day (QID) | ORAL | Status: DC | PRN

## 2023-08-21 MED ORDER — OXYCODONE HCL 5 MG PO TABS
5.0000 mg | ORAL_TABLET | Freq: Four times a day (QID) | ORAL | Status: DC | PRN
  Administered 2023-08-21 – 2023-08-22 (×2): 5 mg via ORAL
  Filled 2023-08-21 (×2): qty 1

## 2023-08-21 MED ORDER — ENOXAPARIN SODIUM 40 MG/0.4ML IJ SOSY
40.0000 mg | PREFILLED_SYRINGE | INTRAMUSCULAR | Status: DC
  Administered 2023-08-21 – 2023-08-22 (×2): 40 mg via SUBCUTANEOUS
  Filled 2023-08-21 (×2): qty 0.4

## 2023-08-21 MED ORDER — ACETAMINOPHEN 500 MG PO TABS
1000.0000 mg | ORAL_TABLET | Freq: Once | ORAL | Status: AC
  Administered 2023-08-21: 1000 mg via ORAL
  Filled 2023-08-21: qty 2

## 2023-08-21 MED ORDER — ACETAMINOPHEN 650 MG RE SUPP: 650.0000 mg | Freq: Four times a day (QID) | RECTAL | Status: DC | PRN

## 2023-08-21 MED ORDER — IOHEXOL 300 MG/ML  SOLN
100.0000 mL | Freq: Once | INTRAMUSCULAR | Status: AC | PRN
  Administered 2023-08-21: 100 mL via INTRAVENOUS

## 2023-08-21 MED ORDER — AMLODIPINE BESYLATE 5 MG PO TABS
10.0000 mg | ORAL_TABLET | Freq: Every day | ORAL | Status: DC
  Administered 2023-08-21 – 2023-08-23 (×3): 10 mg via ORAL
  Filled 2023-08-21 (×3): qty 2

## 2023-08-21 MED ORDER — DORZOLAMIDE HCL-TIMOLOL MAL 2-0.5 % OP SOLN
1.0000 [drp] | Freq: Every day | OPHTHALMIC | Status: DC
  Administered 2023-08-21 – 2023-08-22 (×2): 1 [drp] via OPHTHALMIC
  Filled 2023-08-21: qty 10

## 2023-08-21 MED ORDER — ONDANSETRON HCL 4 MG PO TABS: 4.0000 mg | ORAL_TABLET | Freq: Four times a day (QID) | ORAL | Status: DC | PRN

## 2023-08-21 MED ORDER — PRAVASTATIN SODIUM 40 MG PO TABS
40.0000 mg | ORAL_TABLET | Freq: Every day | ORAL | Status: DC
  Administered 2023-08-21 – 2023-08-22 (×2): 40 mg via ORAL
  Filled 2023-08-21 (×2): qty 1

## 2023-08-21 MED ORDER — HYDROMORPHONE HCL 1 MG/ML IJ SOLN
0.5000 mg | INTRAMUSCULAR | Status: DC | PRN
  Filled 2023-08-21: qty 0.5

## 2023-08-21 MED ORDER — CARBIDOPA-LEVODOPA 25-100 MG PO TABS
2.0000 | ORAL_TABLET | Freq: Three times a day (TID) | ORAL | Status: DC
  Administered 2023-08-21 – 2023-08-23 (×5): 2 via ORAL
  Filled 2023-08-21 (×6): qty 2

## 2023-08-21 MED ORDER — ONDANSETRON HCL 4 MG/2ML IJ SOLN
4.0000 mg | Freq: Once | INTRAMUSCULAR | Status: AC
  Administered 2023-08-21: 4 mg via INTRAVENOUS
  Filled 2023-08-21: qty 2

## 2023-08-21 MED ORDER — LATANOPROST 0.005 % OP SOLN
1.0000 [drp] | Freq: Every day | OPHTHALMIC | Status: DC
  Administered 2023-08-21 – 2023-08-22 (×2): 1 [drp] via OPHTHALMIC
  Filled 2023-08-21: qty 2.5

## 2023-08-21 MED ORDER — MORPHINE SULFATE (PF) 4 MG/ML IV SOLN
2.0000 mg | Freq: Once | INTRAVENOUS | Status: AC
  Administered 2023-08-21: 2 mg via INTRAVENOUS
  Filled 2023-08-21: qty 1

## 2023-08-21 MED ORDER — ONDANSETRON HCL 4 MG/2ML IJ SOLN: 4.0000 mg | Freq: Four times a day (QID) | INTRAMUSCULAR | Status: DC | PRN

## 2023-08-21 MED ORDER — HYDRALAZINE HCL 20 MG/ML IJ SOLN: 10.0000 mg | Freq: Four times a day (QID) | INTRAMUSCULAR | Status: DC | PRN

## 2023-08-21 MED ORDER — PANTOPRAZOLE SODIUM 40 MG PO TBEC
40.0000 mg | DELAYED_RELEASE_TABLET | Freq: Every day | ORAL | Status: DC
  Administered 2023-08-21 – 2023-08-23 (×3): 40 mg via ORAL
  Filled 2023-08-21 (×3): qty 1

## 2023-08-21 MED ORDER — HYDROMORPHONE HCL 1 MG/ML IJ SOLN
0.5000 mg | Freq: Once | INTRAMUSCULAR | Status: AC
  Administered 2023-08-21: 0.5 mg via INTRAVENOUS
  Filled 2023-08-21: qty 0.5

## 2023-08-21 MED ORDER — SODIUM CHLORIDE 0.9 % IV SOLN: INTRAVENOUS | Status: AC

## 2023-08-21 NOTE — Care Management Obs Status (Signed)
 MEDICARE OBSERVATION STATUS NOTIFICATION   Patient Details  Name: Mark Brady MRN: [id number] Date of Birth: 03-26-1930   Medicare Observation Status Notification Given:  Yes    Geraldina Klinefelter, RN 08/21/2023, 7:04 PM

## 2023-08-21 NOTE — Progress Notes (Signed)
   08/21/23 1920  TOC Brief Assessment  Insurance and Status Reviewed  Patient has primary care physician Yes  Home environment has been reviewed From home  Prior level of function: Moderate assist  Prior/Current Home Services No current home services  Social Drivers of Health Review SDOH reviewed no interventions necessary  Readmission risk has been reviewed Yes  Transition of care needs no transition of care needs at this time   Pt currently has moderate assistance c/ADLs. Has rollator, walker, shower chair, bsc, hospital bed. DME acquired from friends and [employer]. TOC to follow.

## 2023-08-21 NOTE — ED Notes (Signed)
 Pt NPO until reassessment after CT

## 2023-08-21 NOTE — H&P (Signed)
 History and Physical    PatientLEDARIUS Brady HQI:[id number] DOB: Mar 04, 1930 DOA: 08/21/2023 DOS: the patient was seen and examined on 08/21/2023 PCP: Cook, Jayce G, DO   Patient coming from: Home  Chief Complaint:  Chief Complaint  Patient presents with   Fall   HPI: Mark Brady is a 89 y.o. male with medical history significant of parkinson's disease, HLD, HTN, type 2 diabetes, GERD and increase intraocular pressure; who presented to ED after mechanical fall and right sided costal pain. Patient reported tripping and falling in his way to the bathroom this morning and ended hitting the side of the bed. Patient denies SOB, fever, dysuria, hematuria, lightheadedness, dizziness or any other complaints.  Work up in ED demonstrating positive 10-11 and 12 IBS broken on the right side. No intracranial abnormalities and otherwise demonstrating WBCs of 12.8, normal hemoglobin and normal platelet counts;Sodium 136, potassium 4.0, chloride 104, bicarb 23, BUN 22, creatinine 0.91 and normal LFTs. CK 78  PRN analgesics provided, incentive spirometry requested and TRH consulted to watch overnight regarding response to pain medication and further management.    Review of Systems: As mentioned in the history of present illness. All other systems reviewed and are negative. Past Medical History:  Diagnosis Date   Arthritis    Diabetes mellitus without complication (HCC)    diet controlled   History of elevated PSA    Hyperlipidemia    Hypertension    Stroke [facility])    Past Surgical History:  Procedure Laterality Date   CATARACT EXTRACTION W/PHACO Right 01/26/2014   Procedure: CATARACT EXTRACTION PHACO AND INTRAOCULAR LENS PLACEMENT (IOC);  Surgeon: Lavonia Powers T. Gennie Kicks, MD;  Location: [facility];  Service: Ophthalmology;  Laterality: Right;  CDE 6.53   CATARACT EXTRACTION W/PHACO Left 02/09/2014   Procedure: CATARACT EXTRACTION PHACO AND INTRAOCULAR LENS PLACEMENT (IOC);  Surgeon: Lavonia Powers T. Gennie Kicks,  MD;  Location: [facility];  Service: Ophthalmology;  Laterality: Left;  CDE:5.58   COLONOSCOPY     YAG LASER APPLICATION Right 12/07/2014   Procedure: YAG LASER APPLICATION;  Surgeon: Albert Huff, MD;  Location: [facility];  Service: Ophthalmology;  Laterality: Right;   Social History:  reports that he has never smoked. He has never used smokeless tobacco. He reports that he does not drink alcohol and does not use drugs.  Allergies  Allergen Reactions   Penicillins    Aspirin  Rash    Family History  Problem Relation Age of Onset   Arthritis Other    Colon cancer Child    Heart failure Child     Prior to Admission medications   Medication Sig Start Date End Date Taking? Authorizing Provider  amLODipine  (NORVASC ) 10 MG tablet Take 1 tablet (10 mg total) by mouth daily. 07/29/23  Yes Cook, Jayce G, DO  carbidopa -levodopa  (SINEMET  IR) 25-100 MG tablet Take 2 tablets by mouth 3 (three) times daily. 7am/11am/4pm 07/09/23  Yes Tat, Von Grumbling, DO  dorzolamide-timolol (COSOPT) 22.3-6.8 MG/ML ophthalmic solution Place 1 drop into both eyes daily.   Yes [provider]  latanoprost (XALATAN) 0.005 % ophthalmic solution SMARTSIG:1 Drop(s) In Eye(s) Every Evening 04/30/22  Yes [provider]  losartan  (COZAAR ) 100 MG tablet TAKE 1 TABLET(100 MG) BY MOUTH DAILY 06/26/23  Yes Cook, Jayce G, DO  pravastatin  (PRAVACHOL ) 40 MG tablet TAKE 1 TABLET(40 MG) BY MOUTH DAILY 06/26/23  Yes Cook, Jayce G, DO  PREVIDENT 5000 BOOSTER PLUS 1.1 % PSTE  07/03/23  Yes [provider]  Accu-Chek Softclix  Lancets lancets USE TO CHECK BLOOD SUGAR DAILY 01/07/23   Cook, Jayce G, DO  glucose blood (ACCU-CHEK AVIVA PLUS) test strip USE TO CHECK BLOOD SUGAR DAILY 05/24/22   Cook, Jayce G, DO  LORazepam  (ATIVAN ) 0.5 MG tablet Take 0.5-1 tablets (0.25-0.5 mg total) by mouth every 8 (eight) hours as needed for anxiety. Patient not taking: Reported on 08/21/2023 05/29/23   Cook, Jayce G, DO  pantoprazole  (PROTONIX ) 40  MG tablet TAKE 1 TABLET(40 MG) BY MOUTH DAILY Patient not taking: Reported on 08/21/2023 11/26/22   Myrna Ast, DO    Physical Exam: Vitals:   08/21/23 1343 08/21/23 1428 08/21/23 1830 08/21/23 2028  BP:  (!) 142/77 (!) 143/75 111/61  Pulse:  85 76 79  Resp:  18 18 20   Temp:  98.7 F (37.1 C) 98.1 F (36.7 C) 98 F (36.7 C)  TempSrc:   Oral Oral  SpO2:  97% 90% 98%  Weight: 76.2 kg     Height: 5\' 6"  (1.676 m)      General exam: Alert, awake, following commands and complaining of right sided costal pain. Respiratory system: Clear to auscultation. Respiratory effort normal.  Cardiovascular system:RRR. No rubs or gallops. Gastrointestinal system: Abdomen is nondistended, soft and nontender. No organomegaly or masses felt. Normal bowel sounds heard. Central nervous system:  No focal neurological deficits. Extremities: No cyanosis or clubbing. Skin: No no petechiae. Psychiatry: Judgement and insight appear normal. Mood & affect appropriate.   Data Reviewed: Please refer to HPI section for blood work results. CT head without acute intracranial normalities - CT chest abdomen and pelvis with contrast demonstrating a pulmonary embolism and showing positive mildly displaced closed fractures of right-sided ribs 10, 11 and 12.  Assessment and Plan: 1-mechanical fall - Patient reports tripping and falling and denies any associated prodromal symptoms - In order to be thorough we will check TSH and B12 - Will provide as needed analgesics and will request physical therapy evaluation.  2-right-sided rib fractures - As needed analgesics has been ordered - Incentive spirometer request - Will follow clinical response and continue supportive care.  3-hypertension - Vital sign for the most part is stable - Will resume home antihypertensive agents while closely watching vital signs.  4-elevated WBCs - Appears to be in the setting of a stress demargination - No acute infection currently  appreciated - Will provide gentle fluid resuscitation overnight and follow WBCs trend.  5-history of type 2 diabetes - Following diet control as an outpatient - Will continue to monitor CBGs and plan to provide coverage if CBGs above 200. - A1c will be updated  6-hyperlipidemia - Continue Pravachol .  7-GERD - Continue PPI  8-increased intraocular pressure - Continue the use of xalatan and cosopt  9-history of Parkinson disease - Will continue treatment with Sinemet  - Continue patient follow-up with PCP and neurology service.    Advance Care Planning:   Code Status: Full Code   Consults: none   Family Communication: no family at bedside  Severity of Illness: The appropriate patient status for this patient is OBSERVATION. Observation status is judged to be reasonable and necessary in order to provide the required intensity of service to ensure the patient's safety. The patient's presenting symptoms, physical exam findings, and initial radiographic and laboratory data in the context of their medical condition is felt to place them at decreased risk for further clinical deterioration. Furthermore, it is anticipated that the patient will be medically stable for discharge from the hospital within  2 midnights of admission.   Author: Justina Oman, MD 08/21/2023 9:30 PM  For on call review [email or web address].

## 2023-08-21 NOTE — ED Notes (Signed)
 ED TO INPATIENT HANDOFF REPORT  ED Nurse Name and Phone #: [phone]  S Name/Age/Gender Mark Brady 89 y.o. male Room/Bed: APA10/APA10  Code Status   Code Status: Prior  Home/SNF/Other Home Patient oriented to: self, place, and situation Is this baseline? Yes   Triage Complete: Triage complete  Chief Complaint Rib fractures [S22.49XA]  Triage Note Pt states he got up at 1am to go to bathroom and fell   Pt has redness and brusing to right flank area;  wife states she thinks pt fell against the bed  Pt states he tripped and fell   Allergies Allergies  Allergen Reactions   Penicillins    Aspirin  Rash    Level of Care/Admitting Diagnosis ED Disposition     ED Disposition  Admit   Condition  --   Comment  Hospital Area: [facility] [[id number]]  Level of Care: Telemetry [5]  Covid Evaluation: Asymptomatic - no recent exposure (last 10 days) testing not required  Diagnosis: Rib fractures [[id number]]  Admitting Physician: Justina Oman [[id number]]  Attending Physician: Justina Oman [[id number]]          B Medical/Surgery History Past Medical History:  Diagnosis Date   Arthritis    Diabetes mellitus without complication (HCC)    diet controlled   History of elevated PSA    Hyperlipidemia    Hypertension    Stroke [facility])    Past Surgical History:  Procedure Laterality Date   CATARACT EXTRACTION W/PHACO Right 01/26/2014   Procedure: CATARACT EXTRACTION PHACO AND INTRAOCULAR LENS PLACEMENT (IOC);  Surgeon: Lavonia Powers T. Gennie Kicks, MD;  Location: [facility];  Service: Ophthalmology;  Laterality: Right;  CDE 6.53   CATARACT EXTRACTION W/PHACO Left 02/09/2014   Procedure: CATARACT EXTRACTION PHACO AND INTRAOCULAR LENS PLACEMENT (IOC);  Surgeon: Lavonia Powers T. Gennie Kicks, MD;  Location: [facility];  Service: Ophthalmology;  Laterality: Left;  CDE:5.58   COLONOSCOPY     YAG LASER APPLICATION Right 12/07/2014   Procedure: YAG LASER APPLICATION;  Surgeon: Albert Huff, MD;  Location: [facility]  ORS;  Service: Ophthalmology;  Laterality: Right;     A IV Location/Drains/Wounds Patient Lines/Drains/Airways Status     Active Line/Drains/Airways     Name Placement date Placement time Site Days   Peripheral IV 08/21/23 20 G Right;Posterior Forearm 08/21/23  0831  Forearm  less than 1            Intake/Output Last 24 hours No intake or output data in the 24 hours ending 08/21/23 1247  Labs/Imaging Results for orders placed or performed during the hospital encounter of 08/21/23 (from the past 48 hours)  CBC with Differential     Status: Abnormal   Collection Time: 08/21/23  7:37 AM  Result Value Ref Range   WBC 12.8 (H) 4.0 - 10.5 K/uL   RBC 4.23 4.22 - 5.81 MIL/uL   Hemoglobin 14.1 13.0 - 17.0 g/dL   HCT 62.1 30.8 - 65.7 %   MCV 96.5 80.0 - 100.0 fL   MCH 33.3 26.0 - 34.0 pg   MCHC 34.6 30.0 - 36.0 g/dL   RDW 84.6 96.2 - 95.2 %   Platelets 371 150 - 400 K/uL   nRBC 0.0 0.0 - 0.2 %   Neutrophils Relative % 79 %   Neutro Abs 10.0 (H) 1.7 - 7.7 K/uL   Lymphocytes Relative 11 %   Lymphs Abs 1.4 0.7 - 4.0 K/uL   Monocytes Relative 8 %   Monocytes Absolute 1.0 0.1 - 1.0 K/uL  Eosinophils Relative 1 %   Eosinophils Absolute 0.2 0.0 - 0.5 K/uL   Basophils Relative 0 %   Basophils Absolute 0.0 0.0 - 0.1 K/uL   Immature Granulocytes 1 %   Abs Immature Granulocytes 0.08 (H) 0.00 - 0.07 K/uL    Comment: Performed at [facility], [address], [place], [place] [address]  Comprehensive metabolic panel     Status: Abnormal   Collection Time: 08/21/23  7:37 AM  Result Value Ref Range   Sodium 136 135 - 145 mmol/L   Potassium 4.0 3.5 - 5.1 mmol/L   Chloride 104 98 - 111 mmol/L   CO2 23 22 - 32 mmol/L   Glucose, Bld 140 (H) 70 - 99 mg/dL    Comment: Glucose reference range applies only to samples taken after fasting for at least 8 hours.   BUN 22 8 - 23 mg/dL   Creatinine, Ser 0.98 0.61 - 1.24 mg/dL   Calcium 9.2 8.9 - 11.9 mg/dL   Total Protein 7.5 6.5 - 8.1 g/dL    Albumin 4.1 3.5 - 5.0 g/dL   AST 15 15 - 41 U/L   ALT 14 0 - 44 U/L   Alkaline Phosphatase 69 38 - 126 U/L   Total Bilirubin 0.5 0.0 - 1.2 mg/dL   GFR, Estimated >14 >78 mL/min    Comment: (NOTE) Calculated using the CKD-EPI Creatinine Equation (2021)    Anion gap 9 5 - 15    Comment: Performed at [facility], [address] [place], [place] [address]  CK     Status: None   Collection Time: 08/21/23  7:37 AM  Result Value Ref Range   Total CK 78 49 - 397 U/L    Comment: Performed at [facility], [address] [place], [place] [address]   CT CHEST ABDOMEN PELVIS W CONTRAST Result Date: 08/21/2023 CLINICAL DATA:  Polytrauma, blunt R flank pain sp fall. Redness and bruising to right flank area. EXAM: CT CHEST, ABDOMEN, AND PELVIS WITH CONTRAST TECHNIQUE: Multidetector CT imaging of the chest, abdomen and pelvis was performed following the standard protocol during bolus administration of intravenous contrast. RADIATION DOSE REDUCTION: This exam was performed according to the departmental dose-optimization program which includes automated exposure control, adjustment of the mA and/or kV according to patient size and/or use of iterative reconstruction technique. CONTRAST:  100mL OMNIPAQUE IOHEXOL 300 MG/ML  SOLN COMPARISON:  CT scan abdomen from 06/27/2007 and CT scan chest, abdomen and pelvis from 03/31/2005. FINDINGS: CT CHEST FINDINGS Cardiovascular: Normal cardiac size. No pericardial effusion. No aortic aneurysm. There are coronary artery calcifications, in keeping with coronary artery disease. There are also mild-to-moderate peripheral atherosclerotic vascular calcifications of thoracic aorta and its major branches. Mediastinum/Nodes: Visualized thyroid  gland appears grossly unremarkable. No solid / cystic mediastinal masses. The esophagus is nondistended precluding optimal assessment. No axillary, mediastinal or hilar lymphadenopathy by size criteria. Lungs/Pleura: The central tracheo-bronchial  tree is patent. There are dependent changes in bilateral lungs. No mass or consolidation. No pleural effusion or pneumothorax. There is a sub 4 mm solid noncalcified nodule in the left lung upper lobe (series 5, image 43), which is present since the prior study from 2006 and favored benign. No new or suspicious lung nodule. Musculoskeletal: The visualized soft tissues of the chest wall are grossly unremarkable. No suspicious osseous lesions. There are mild to moderate multilevel degenerative changes in the visualized spine. There are mildly displaced fractures of posterolateral right ninth through eleventh ribs. CT ABDOMEN PELVIS FINDINGS  Hepatobiliary: The liver is normal in size. Non-cirrhotic configuration. These is diffuse hepatic steatosis. No suspicious mass. Note is made of at least 2, subcentimeter sized hypoattenuating foci, which are too small to adequately characterize. No intrahepatic or extrahepatic bile duct dilation. No calcified gallstones. Normal gallbladder wall thickness. No pericholecystic inflammatory changes. Pancreas: Small/atrophic pancreas. No peripancreatic fat stranding. There are several hypoattenuating structures in the pancreatic body (largest measuring up to 8 x 11 mm) and uncinate process measuring up to 10 x 15 mm, which are incompletely characterized on the current exam. The 2 adjacent lesions in the pancreatic body were not distinctly visualized on the prior study from 2009. However, the lesion in the uncinate process is present since the prior study from 2009. 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 Correlate clinically to determine the need for follow-up examination with MRI/MRCP in 2 years. Spleen: Within normal limits. No focal lesion. Adrenals/Urinary Tract: Adrenal glands are unremarkable. No suspicious renal mass. No hydronephrosis. No renal or ureteric calculi. Unremarkable urinary bladder. Stomach/Bowel: There  is a tiny sliding hiatal hernia. No disproportionate dilation of the small or large bowel loops. No evidence of abnormal bowel wall thickening or inflammatory changes. The appendix is unremarkable. There are scattered diverticula mainly in the sigmoid colon, without imaging signs of diverticulitis. Vascular/Lymphatic: No ascites or pneumoperitoneum. No abdominal or pelvic lymphadenopathy, by size criteria. No aneurysmal dilation of the major abdominal arteries. There are moderate peripheral atherosclerotic vascular calcifications of the aorta and its major branches. Reproductive: Enlarged prostate. Correlation with serum PSA levels is recommended. Symmetric seminal vesicles. Other: The visualized soft tissues and abdominal wall are unremarkable. Musculoskeletal: No suspicious osseous lesions. There are mild - moderate multilevel degenerative changes in the visualized spine. IMPRESSION: 1. Mildly displaced fractures of posterolateral right ninth through eleventh ribs. Otherwise, no traumatic injury to the chest, abdomen or pelvis. 2. Severe pancreatic lesions and markedly enlarged prostate gland, as discussed above. 3. Multiple other nonacute observations, as described above. Aortic Atherosclerosis (ICD10-I70.0). Electronically Signed   By: Beula Brunswick M.D.   On: 08/21/2023 09:39   CT HEAD WO CONTRAST Result Date: 08/21/2023 CLINICAL DATA:  Provided history: Head trauma, moderate/severe. Polytrauma, blunt. Additional history provided: Fall. EXAM: CT HEAD WITHOUT CONTRAST CT CERVICAL SPINE WITHOUT CONTRAST TECHNIQUE: Multidetector CT imaging of the head and cervical spine was performed following the standard protocol without intravenous contrast. Multiplanar CT image reconstructions of the cervical spine were also generated. RADIATION DOSE REDUCTION: This exam was performed according to the departmental dose-optimization program which includes automated exposure control, adjustment of the mA and/or kV according  to patient size and/or use of iterative reconstruction technique. COMPARISON:  Head CT 12/05/2022.  Cervical spine CT 12/05/2022. FINDINGS: CT HEAD FINDINGS Brain: Generalized cerebral atrophy. 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 Background moderate-to-advanced patchy and ill-defined hypoattenuation within the cerebral white matter, nonspecific but compatible with chronic small vessel ischemic disease. There is no acute intracranial hemorrhage. No demarcated cortical infarct. No extra-axial fluid collection. No evidence of an intracranial mass. No midline shift. Vascular: No hyperdense vessel.  Atherosclerotic calcifications. Skull: No calvarial fracture or aggressive osseous lesion. Sinuses/Orbits: No mass or acute finding within the imaged orbits. 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 CT CERVICAL SPINE FINDINGS Alignment: Nonspecific reversal of the expected cervical or doses. 2 mm C3-C4 grade 1 anterolisthesis. 3 mm C4-C5 grade 1 anterolisthesis. 2 mm C5-C6 grade 1 anterolisthesis. Skull base and  vertebrae: The basion-dental and atlanto-dental intervals are maintained.No evidence of acute fracture to the cervical spine. Facet ankylosis on the right at C3-C4. Soft tissues and spinal canal: No prevertebral fluid or swelling. No visible canal hematoma. Disc levels: Cervical spondylosis with multilevel disc space narrowing, disc bulges/central disc protrusions, uncovertebral hypertrophy and facet arthropathy. Disc space narrowing is greatest at C6-C7 (moderate to advanced at this level). No appreciable high-grade spinal canal stenosis. Multilevel bony neural foraminal narrowing. Degenerative changes also present at the C1-C2 articulation. Multilevel ventral osteophytes. Upper chest: No consolidation within the imaged lung apices. No visible pneumothorax. IMPRESSION: CT head: 1.  No evidence of an acute intracranial abnormality.  2. Parenchymal atrophy and chronic small vessel ischemic disease. 3. Chronic severe left sphenoid sinusitis. CT cervical spine: 1. No evidence of an acute cervical spine fracture. 2. Nonspecific reversal of the expected cervical lordosis. 3. Grade 1 anterolisthesis at C3-C4, C4-C5 and C5-C6, unchanged from the prior cervical spine CT of 12/05/2022. 4. Cervical spondylosis as described. 5. Facet ankylosis on the right at C3-C4. Electronically Signed   By: Bascom Lily D.O.   On: 08/21/2023 09:29   CT CERVICAL SPINE WO CONTRAST Result Date: 08/21/2023 CLINICAL DATA:  Provided history: Head trauma, moderate/severe. Polytrauma, blunt. Additional history provided: Fall. EXAM: CT HEAD WITHOUT CONTRAST CT CERVICAL SPINE WITHOUT CONTRAST TECHNIQUE: Multidetector CT imaging of the head and cervical spine was performed following the standard protocol without intravenous contrast. Multiplanar CT image reconstructions of the cervical spine were also generated. RADIATION DOSE REDUCTION: This exam was performed according to the departmental dose-optimization program which includes automated exposure control, adjustment of the mA and/or kV according to patient size and/or use of iterative reconstruction technique. COMPARISON:  Head CT 12/05/2022.  Cervical spine CT 12/05/2022. FINDINGS: CT HEAD FINDINGS Brain: Generalized cerebral atrophy. 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 Background moderate-to-advanced patchy and ill-defined hypoattenuation within the cerebral white matter, nonspecific but compatible with chronic small vessel ischemic disease. There is no acute intracranial hemorrhage. No demarcated cortical infarct. No extra-axial fluid collection. No evidence of an intracranial mass. No midline shift. Vascular: No hyperdense vessel.  Atherosclerotic calcifications. Skull: No calvarial fracture or aggressive osseous lesion. Sinuses/Orbits: No mass or acute finding within the  imaged orbits. 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 CT CERVICAL SPINE FINDINGS Alignment: Nonspecific reversal of the expected cervical or doses. 2 mm C3-C4 grade 1 anterolisthesis. 3 mm C4-C5 grade 1 anterolisthesis. 2 mm C5-C6 grade 1 anterolisthesis. Skull base and vertebrae: The basion-dental and atlanto-dental intervals are maintained.No evidence of acute fracture to the cervical spine. Facet ankylosis on the right at C3-C4. Soft tissues and spinal canal: No prevertebral fluid or swelling. No visible canal hematoma. Disc levels: Cervical spondylosis with multilevel disc space narrowing, disc bulges/central disc protrusions, uncovertebral hypertrophy and facet arthropathy. Disc space narrowing is greatest at C6-C7 (moderate to advanced at this level). No appreciable high-grade spinal canal stenosis. Multilevel bony neural foraminal narrowing. Degenerative changes also present at the C1-C2 articulation. Multilevel ventral osteophytes. Upper chest: No consolidation within the imaged lung apices. No visible pneumothorax. IMPRESSION: CT head: 1.  No evidence of an acute intracranial abnormality. 2. Parenchymal atrophy and chronic small vessel ischemic disease. 3. Chronic severe left sphenoid sinusitis. CT cervical spine: 1. No evidence of an acute cervical spine fracture. 2. Nonspecific reversal of the expected cervical lordosis. 3. Grade 1 anterolisthesis at C3-C4, C4-C5 and C5-C6, unchanged from the prior cervical spine CT of  12/05/2022. 4. Cervical spondylosis as described. 5. Facet ankylosis on the right at C3-C4. Electronically Signed   By: Bascom Lily D.O.   On: 08/21/2023 09:29    Pending Labs Unresulted Labs (From admission, onward)     Start     Ordered   Pending  Hemoglobin A1c  (Stemi Panel (PNL))  ONCE - URGENT,   R        Pending   Pending  CBC with Differential/Platelet  (Stemi Panel (PNL))  ONCE - STAT,   R        Pending   Pending   Protime-INR  (Stemi Panel (PNL))  ONCE - STAT,   R        Pending   Pending  APTT  (Stemi Panel (PNL))  ONCE - STAT,   R        Pending   Pending  Comprehensive metabolic panel  (Stemi Panel (PNL))  ONCE - STAT,   R        Pending   Pending  Lipid panel  (Stemi Panel (PNL))  Once,   R        Pending   Pending  Lactic acid, plasma  (Stemi Panel (PNL))  Once,   STAT        Pending            Vitals/Pain Today's Vitals   08/21/23 1045 08/21/23 1155 08/21/23 1210 08/21/23 1239  BP: (!) 152/86 (!) 151/85 (!) 156/80   Pulse: 81  94   Resp:      Temp:      TempSrc:      SpO2: 96%  91%   PainSc:    Asleep    Isolation Precautions No active isolations  Medications Medications  lidocaine  (LIDODERM ) 5 % 1 patch (1 patch Transdermal Patch Applied 08/21/23 0958)  iohexol (OMNIPAQUE) 300 MG/ML solution 100 mL (100 mLs Intravenous Contrast Given 08/21/23 0854)  acetaminophen  (TYLENOL ) tablet 1,000 mg (1,000 mg Oral Given 08/21/23 0957)  HYDROmorphone (DILAUDID) injection 0.5 mg (0.5 mg Intravenous Given 08/21/23 0957)  morphine (PF) 4 MG/ML injection 2 mg (2 mg Intravenous Given 08/21/23 1200)  ondansetron  (ZOFRAN ) injection 4 mg (4 mg Intravenous Given 08/21/23 1200)    Mobility Normally walks but had a bad fall. Pt is having significant pain and has 3 broken ribs     Focused Assessments    R Recommendations: See Admitting Provider Note  Report given to:   Additional Notes:

## 2023-08-21 NOTE — ED Notes (Signed)
 Pt lying in bed in and out of sleep with family at bedside. Breathing seen to be WNL.

## 2023-08-21 NOTE — Plan of Care (Signed)

## 2023-08-21 NOTE — ED Provider Notes (Signed)
 [place] EMERGENCY DEPARTMENT AT [facility] Provider Note   CSN: [id number] Arrival date & time: 08/21/23  0630     History  Chief Complaint  Patient presents with   Fall    Mark Brady is a 89 y.o. male.  89 year old male with a history of Parkinson's and dementia who presents emergency department after a fall.  Wife reports that last night he had an unwitnessed fall.  She heard him go down and then when she went to check on him he was already standing.  Complaining of right-sided flank pain.  Unsure of head strike.  Not on blood thinners.  Has been ambulatory since.       Home Medications Prior to Admission medications   Medication Sig Start Date End Date Taking? Authorizing Provider  amLODipine  (NORVASC ) 10 MG tablet Take 1 tablet (10 mg total) by mouth daily. 07/29/23  Yes Cook, Jayce G, DO  carbidopa -levodopa  (SINEMET  IR) 25-100 MG tablet Take 2 tablets by mouth 3 (three) times daily. 7am/11am/4pm 07/09/23  Yes Tat, Von Grumbling, DO  dorzolamide-timolol (COSOPT) 22.3-6.8 MG/ML ophthalmic solution Place 1 drop into both eyes daily.   Yes [provider]  latanoprost (XALATAN) 0.005 % ophthalmic solution SMARTSIG:1 Drop(s) In Eye(s) Every Evening 04/30/22  Yes [provider]  losartan  (COZAAR ) 100 MG tablet TAKE 1 TABLET(100 MG) BY MOUTH DAILY 06/26/23  Yes Cook, Jayce G, DO  pravastatin  (PRAVACHOL ) 40 MG tablet TAKE 1 TABLET(40 MG) BY MOUTH DAILY 06/26/23  Yes Cook, Jayce G, DO  PREVIDENT 5000 BOOSTER PLUS 1.1 % PSTE  07/03/23  Yes [provider]  Accu-Chek Softclix Lancets lancets USE TO CHECK BLOOD SUGAR DAILY 01/07/23   Cook, Jayce G, DO  glucose blood (ACCU-CHEK AVIVA PLUS) test strip USE TO CHECK BLOOD SUGAR DAILY 05/24/22   Cook, Jayce G, DO  LORazepam  (ATIVAN ) 0.5 MG tablet Take 0.5-1 tablets (0.25-0.5 mg total) by mouth every 8 (eight) hours as needed for anxiety. Patient not taking: Reported on 08/21/2023 05/29/23   Cook, Jayce G, DO   pantoprazole  (PROTONIX ) 40 MG tablet TAKE 1 TABLET(40 MG) BY MOUTH DAILY Patient not taking: Reported on 08/21/2023 11/26/22   Cook, Jayce G, DO      Allergies    Penicillins and Aspirin     Review of Systems   Review of Systems  Physical Exam Updated Vital Signs BP (!) 142/77   Pulse 85   Temp 98.7 F (37.1 C)   Resp 18   Ht 5\' 6"  (1.676 m)   Wt 76.2 kg   SpO2 97%   BMI 27.12 kg/m  Physical Exam Constitutional:      General: He is not in acute distress.    Appearance: Normal appearance. He is not ill-appearing.  HENT:     Head: Normocephalic and atraumatic.     Right Ear: External ear normal.     Left Ear: External ear normal.     Mouth/Throat:     Mouth: Mucous membranes are moist.     Pharynx: Oropharynx is clear.  Eyes:     Extraocular Movements: Extraocular movements intact.     Conjunctiva/sclera: Conjunctivae normal.     Pupils: Pupils are equal, round, and reactive to light.  Neck:     Comments: No C-spine midline tenderness to palpation Cardiovascular:     Rate and Rhythm: Normal rate and regular rhythm.     Pulses: Normal pulses.     Heart sounds: Normal heart sounds.  Pulmonary:  Effort: Pulmonary effort is normal. No respiratory distress.     Breath sounds: Normal breath sounds.  Abdominal:     General: Abdomen is flat. There is distension.     Palpations: Abdomen is soft. There is no mass.     Tenderness: There is no abdominal tenderness. There is no guarding.     Comments: Bruising on right flank  Musculoskeletal:        General: No deformity. Normal range of motion.     Cervical back: No rigidity or tenderness.     Comments: No tenderness to palpation of midline thoracic or lumbar spine.  No step-offs palpated.  Tenderness to palpation of right posterior chest wall inferiorly with bruising noted.  No tenderness to palpation of bilateral clavicles.  No tenderness to palpation, bruising, or deformities noted of bilateral shoulders, elbows,  wrists, hips, knees, or ankles.  Neurological:     Mental Status: He is alert. Mental status is at baseline.     Cranial Nerves: No cranial nerve deficit.     Motor: No weakness.     ED Results / Procedures / Treatments   Labs (all labs ordered are listed, but only abnormal results are displayed) Labs Reviewed  CBC WITH DIFFERENTIAL/PLATELET - Abnormal; Notable for the following components:      Result Value   WBC 12.8 (*)    Neutro Abs 10.0 (*)    Abs Immature Granulocytes 0.08 (*)    All other components within normal limits  COMPREHENSIVE METABOLIC PANEL WITH GFR - Abnormal; Notable for the following components:   Glucose, Bld 140 (*)    All other components within normal limits  CK  TSH  VITAMIN B12    EKG None  Radiology CT CHEST ABDOMEN PELVIS W CONTRAST Result Date: 08/21/2023 CLINICAL DATA:  Polytrauma, blunt R flank pain sp fall. Redness and bruising to right flank area. EXAM: CT CHEST, ABDOMEN, AND PELVIS WITH CONTRAST TECHNIQUE: Multidetector CT imaging of the chest, abdomen and pelvis was performed following the standard protocol during bolus administration of intravenous contrast. RADIATION DOSE REDUCTION: This exam was performed according to the departmental dose-optimization program which includes automated exposure control, adjustment of the mA and/or kV according to patient size and/or use of iterative reconstruction technique. CONTRAST:  100mL OMNIPAQUE IOHEXOL 300 MG/ML  SOLN COMPARISON:  CT scan abdomen from 06/27/2007 and CT scan chest, abdomen and pelvis from 03/31/2005. FINDINGS: CT CHEST FINDINGS Cardiovascular: Normal cardiac size. No pericardial effusion. No aortic aneurysm. There are coronary artery calcifications, in keeping with coronary artery disease. There are also mild-to-moderate peripheral atherosclerotic vascular calcifications of thoracic aorta and its major branches. Mediastinum/Nodes: Visualized thyroid  gland appears grossly unremarkable. No solid  / cystic mediastinal masses. The esophagus is nondistended precluding optimal assessment. No axillary, mediastinal or hilar lymphadenopathy by size criteria. Lungs/Pleura: The central tracheo-bronchial tree is patent. There are dependent changes in bilateral lungs. No mass or consolidation. No pleural effusion or pneumothorax. There is a sub 4 mm solid noncalcified nodule in the left lung upper lobe (series 5, image 43), which is present since the prior study from 2006 and favored benign. No new or suspicious lung nodule. Musculoskeletal: The visualized soft tissues of the chest wall are grossly unremarkable. No suspicious osseous lesions. There are mild to moderate multilevel degenerative changes in the visualized spine. There are mildly displaced fractures of posterolateral right ninth through eleventh ribs. CT ABDOMEN PELVIS FINDINGS Hepatobiliary: The liver is normal in size. Non-cirrhotic configuration. These is diffuse hepatic  steatosis. No suspicious mass. Note is made of at least 2, subcentimeter sized hypoattenuating foci, which are too small to adequately characterize. No intrahepatic or extrahepatic bile duct dilation. No calcified gallstones. Normal gallbladder wall thickness. No pericholecystic inflammatory changes. Pancreas: Small/atrophic pancreas. No peripancreatic fat stranding. There are several hypoattenuating structures in the pancreatic body (largest measuring up to 8 x 11 mm) and uncinate process measuring up to 10 x 15 mm, which are incompletely characterized on the current exam. The 2 adjacent lesions in the pancreatic body were not distinctly visualized on the prior study from 2009. However, the lesion in the uncinate process is present since the prior study from 2009. 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 Correlate clinically to determine the need for follow-up examination with MRI/MRCP in 2 years. Spleen: Within normal  limits. No focal lesion. Adrenals/Urinary Tract: Adrenal glands are unremarkable. No suspicious renal mass. No hydronephrosis. No renal or ureteric calculi. Unremarkable urinary bladder. Stomach/Bowel: There is a tiny sliding hiatal hernia. No disproportionate dilation of the small or large bowel loops. No evidence of abnormal bowel wall thickening or inflammatory changes. The appendix is unremarkable. There are scattered diverticula mainly in the sigmoid colon, without imaging signs of diverticulitis. Vascular/Lymphatic: No ascites or pneumoperitoneum. No abdominal or pelvic lymphadenopathy, by size criteria. No aneurysmal dilation of the major abdominal arteries. There are moderate peripheral atherosclerotic vascular calcifications of the aorta and its major branches. Reproductive: Enlarged prostate. Correlation with serum PSA levels is recommended. Symmetric seminal vesicles. Other: The visualized soft tissues and abdominal wall are unremarkable. Musculoskeletal: No suspicious osseous lesions. There are mild - moderate multilevel degenerative changes in the visualized spine. IMPRESSION: 1. Mildly displaced fractures of posterolateral right ninth through eleventh ribs. Otherwise, no traumatic injury to the chest, abdomen or pelvis. 2. Severe pancreatic lesions and markedly enlarged prostate gland, as discussed above. 3. Multiple other nonacute observations, as described above. Aortic Atherosclerosis (ICD10-I70.0). Electronically Signed   By: Beula Brunswick M.D.   On: 08/21/2023 09:39   CT HEAD WO CONTRAST Result Date: 08/21/2023 CLINICAL DATA:  Provided history: Head trauma, moderate/severe. Polytrauma, blunt. Additional history provided: Fall. EXAM: CT HEAD WITHOUT CONTRAST CT CERVICAL SPINE WITHOUT CONTRAST TECHNIQUE: Multidetector CT imaging of the head and cervical spine was performed following the standard protocol without intravenous contrast. Multiplanar CT image reconstructions of the cervical spine were  also generated. RADIATION DOSE REDUCTION: This exam was performed according to the departmental dose-optimization program which includes automated exposure control, adjustment of the mA and/or kV according to patient size and/or use of iterative reconstruction technique. COMPARISON:  Head CT 12/05/2022.  Cervical spine CT 12/05/2022. FINDINGS: CT HEAD FINDINGS Brain: Generalized cerebral atrophy. 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 Background moderate-to-advanced patchy and ill-defined hypoattenuation within the cerebral white matter, nonspecific but compatible with chronic small vessel ischemic disease. There is no acute intracranial hemorrhage. No demarcated cortical infarct. No extra-axial fluid collection. No evidence of an intracranial mass. No midline shift. Vascular: No hyperdense vessel.  Atherosclerotic calcifications. Skull: No calvarial fracture or aggressive osseous lesion. Sinuses/Orbits: No mass or acute finding within the imaged orbits. 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 CT CERVICAL SPINE FINDINGS Alignment: Nonspecific reversal of the expected cervical or doses. 2 mm C3-C4 grade 1 anterolisthesis. 3 mm C4-C5 grade 1 anterolisthesis. 2 mm C5-C6 grade 1 anterolisthesis. Skull base and vertebrae: The basion-dental and atlanto-dental intervals are maintained.No evidence of acute fracture to  the cervical spine. Facet ankylosis on the right at C3-C4. Soft tissues and spinal canal: No prevertebral fluid or swelling. No visible canal hematoma. Disc levels: Cervical spondylosis with multilevel disc space narrowing, disc bulges/central disc protrusions, uncovertebral hypertrophy and facet arthropathy. Disc space narrowing is greatest at C6-C7 (moderate to advanced at this level). No appreciable high-grade spinal canal stenosis. Multilevel bony neural foraminal narrowing. Degenerative changes also present at the C1-C2  articulation. Multilevel ventral osteophytes. Upper chest: No consolidation within the imaged lung apices. No visible pneumothorax. IMPRESSION: CT head: 1.  No evidence of an acute intracranial abnormality. 2. Parenchymal atrophy and chronic small vessel ischemic disease. 3. Chronic severe left sphenoid sinusitis. CT cervical spine: 1. No evidence of an acute cervical spine fracture. 2. Nonspecific reversal of the expected cervical lordosis. 3. Grade 1 anterolisthesis at C3-C4, C4-C5 and C5-C6, unchanged from the prior cervical spine CT of 12/05/2022. 4. Cervical spondylosis as described. 5. Facet ankylosis on the right at C3-C4. Electronically Signed   By: Bascom Lily D.O.   On: 08/21/2023 09:29   CT CERVICAL SPINE WO CONTRAST Result Date: 08/21/2023 CLINICAL DATA:  Provided history: Head trauma, moderate/severe. Polytrauma, blunt. Additional history provided: Fall. EXAM: CT HEAD WITHOUT CONTRAST CT CERVICAL SPINE WITHOUT CONTRAST TECHNIQUE: Multidetector CT imaging of the head and cervical spine was performed following the standard protocol without intravenous contrast. Multiplanar CT image reconstructions of the cervical spine were also generated. RADIATION DOSE REDUCTION: This exam was performed according to the departmental dose-optimization program which includes automated exposure control, adjustment of the mA and/or kV according to patient size and/or use of iterative reconstruction technique. COMPARISON:  Head CT 12/05/2022.  Cervical spine CT 12/05/2022. FINDINGS: CT HEAD FINDINGS Brain: Generalized cerebral atrophy. 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 Background moderate-to-advanced patchy and ill-defined hypoattenuation within the cerebral white matter, nonspecific but compatible with chronic small vessel ischemic disease. There is no acute intracranial hemorrhage. No demarcated cortical infarct. No extra-axial fluid collection. No evidence of an  intracranial mass. No midline shift. Vascular: No hyperdense vessel.  Atherosclerotic calcifications. Skull: No calvarial fracture or aggressive osseous lesion. Sinuses/Orbits: No mass or acute finding within the imaged orbits. 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 CT CERVICAL SPINE FINDINGS Alignment: Nonspecific reversal of the expected cervical or doses. 2 mm C3-C4 grade 1 anterolisthesis. 3 mm C4-C5 grade 1 anterolisthesis. 2 mm C5-C6 grade 1 anterolisthesis. Skull base and vertebrae: The basion-dental and atlanto-dental intervals are maintained.No evidence of acute fracture to the cervical spine. Facet ankylosis on the right at C3-C4. Soft tissues and spinal canal: No prevertebral fluid or swelling. No visible canal hematoma. Disc levels: Cervical spondylosis with multilevel disc space narrowing, disc bulges/central disc protrusions, uncovertebral hypertrophy and facet arthropathy. Disc space narrowing is greatest at C6-C7 (moderate to advanced at this level). No appreciable high-grade spinal canal stenosis. Multilevel bony neural foraminal narrowing. Degenerative changes also present at the C1-C2 articulation. Multilevel ventral osteophytes. Upper chest: No consolidation within the imaged lung apices. No visible pneumothorax. IMPRESSION: CT head: 1.  No evidence of an acute intracranial abnormality. 2. Parenchymal atrophy and chronic small vessel ischemic disease. 3. Chronic severe left sphenoid sinusitis. CT cervical spine: 1. No evidence of an acute cervical spine fracture. 2. Nonspecific reversal of the expected cervical lordosis. 3. Grade 1 anterolisthesis at C3-C4, C4-C5 and C5-C6, unchanged from the prior cervical spine CT of 12/05/2022. 4. Cervical spondylosis as described. 5. Facet ankylosis on the right at  C3-C4. Electronically Signed   By: Bascom Lily D.O.   On: 08/21/2023 09:29    Procedures Procedures    Medications Ordered in ED Medications   lidocaine  (LIDODERM ) 5 % 1 patch (1 patch Transdermal Patch Applied 08/21/23 0958)  enoxaparin (LOVENOX) injection 40 mg (has no administration in time range)  0.9 %  sodium chloride  infusion ( Intravenous New Bag/Given 08/21/23 1507)  acetaminophen  (TYLENOL ) tablet 650 mg (has no administration in time range)    Or  acetaminophen  (TYLENOL ) suppository 650 mg (has no administration in time range)  oxyCODONE (Oxy IR/ROXICODONE) immediate release tablet 5 mg (has no administration in time range)  HYDROmorphone (DILAUDID) injection 0.5-1 mg (has no administration in time range)  ondansetron  (ZOFRAN ) tablet 4 mg (has no administration in time range)    Or  ondansetron  (ZOFRAN ) injection 4 mg (has no administration in time range)  carbidopa -levodopa  (SINEMET  IR) 25-100 MG per tablet immediate release 2 tablet (2 tablets Oral Given 08/21/23 1507)  latanoprost (XALATAN) 0.005 % ophthalmic solution 1 drop (has no administration in time range)  pantoprazole  (PROTONIX ) EC tablet 40 mg (40 mg Oral Given 08/21/23 1506)  pravastatin  (PRAVACHOL ) tablet 40 mg (has no administration in time range)  LORazepam  (ATIVAN ) tablet 0.25 mg (has no administration in time range)  dorzolamide-timolol (COSOPT) 2-0.5 % ophthalmic solution 1 drop (has no administration in time range)  amLODipine  (NORVASC ) tablet 10 mg (10 mg Oral Given 08/21/23 1506)  hydrALAZINE (APRESOLINE) injection 10 mg (has no administration in time range)  iohexol (OMNIPAQUE) 300 MG/ML solution 100 mL (100 mLs Intravenous Contrast Given 08/21/23 0854)  acetaminophen  (TYLENOL ) tablet 1,000 mg (1,000 mg Oral Given 08/21/23 0957)  HYDROmorphone (DILAUDID) injection 0.5 mg (0.5 mg Intravenous Given 08/21/23 0957)  morphine (PF) 4 MG/ML injection 2 mg (2 mg Intravenous Given 08/21/23 1200)  ondansetron  (ZOFRAN ) injection 4 mg (4 mg Intravenous Given 08/21/23 1200)    ED Course/ Medical Decision Making/ A&P Clinical Course as of 08/21/23 1630  Wed Aug 21, 2023  1233  Dr Michaelene Admire from hospitalist consulted.  He will evaluate the patient for admission. [RP]    Clinical Course User Index [RP] Ninetta Basket, MD                                 Medical Decision Making Amount and/or Complexity of Data Reviewed Labs: ordered. Radiology: ordered.  Risk OTC drugs. Prescription drug management. Decision regarding hospitalization.   Mark Brady is a 89 y.o. male with comorbidities that complicate the patient evaluation including Parkinson's and dementia who presents to the emergency department after a fall  Initial Ddx:  TBI, C-spine injury, rib fracture, pneumothorax, intra-abdominal injury, hip fracture, extremity fracture  MDM/Course:  Patient presents to the emergency department after a fall.  Suspect it was mechanical and likely due to his Parkinson's and ambulatory difficulty.  Is complaining of right rib pain.  Vital signs reassuring.  He is not in acute distress.  Does have bruising over the site of pain.  Was unwitnessed and cannot tell me if he has had or not.  Given his age she underwent a CT of the head and C-spine.  Also had a CT of the chest and abdomen pelvis to evaluate for any thoracic/intra-abdominal injury.  Was found to have 3 broken ribs on the right.  Given his age and risk factors was admitted to hospitalist for pain management and observation.  This patient presents to the ED for concern of complaints listed in HPI, this involves an extensive number of treatment options, and is a complaint that carries with it a high risk of complications and morbidity. Disposition including potential need for admission considered.   Dispo: Admit to Floor  Additional history obtained from spouse Records reviewed Outpatient Clinic Notes The following labs were independently interpreted: Chemistry and show no acute abnormality I independently reviewed the following imaging with scope of interpretation limited to determining acute life  threatening conditions related to emergency care: CT Head and agree with the radiologist interpretation with the following exceptions: none I personally reviewed and interpreted cardiac monitoring: normal sinus rhythm  I personally reviewed and interpreted the pt's EKG: see above for interpretation  I have reviewed the patients home medications and made adjustments as needed Consults: Hospitalist Social Determinants of health:  Geriatric  Portions of this note were generated with Scientist, clinical (histocompatibility and immunogenetics). Dictation errors may occur despite best attempts at proofreading.     Final Clinical Impression(s) / ED Diagnoses Final diagnoses:  Fall, initial encounter  Closed fracture of multiple ribs of right side, initial encounter  History of Parkinson's disease    Rx / DC Orders ED Discharge Orders     None         Ninetta Basket, MD 08/21/23 1630

## 2023-08-21 NOTE — ED Notes (Signed)
 Requested water for pt

## 2023-08-21 NOTE — ED Triage Notes (Signed)
 Pt states he got up at 1am to go to bathroom and fell   Pt has redness and brusing to right flank area;  wife states she thinks pt fell against the bed  Pt states he tripped and fell

## 2023-08-22 DIAGNOSIS — R5381 Other malaise: Secondary | ICD-10-CM

## 2023-08-22 DIAGNOSIS — W19XXXD Unspecified fall, subsequent encounter: Secondary | ICD-10-CM | POA: Diagnosis not present

## 2023-08-22 DIAGNOSIS — S2241XA Multiple fractures of ribs, right side, initial encounter for closed fracture: Secondary | ICD-10-CM | POA: Diagnosis not present

## 2023-08-22 DIAGNOSIS — Z8669 Personal history of other diseases of the nervous system and sense organs: Secondary | ICD-10-CM | POA: Diagnosis not present

## 2023-08-22 LAB — CBC
HCT: 38.9 % — ABNORMAL LOW (ref 39.0–52.0)
Hemoglobin: 12.6 g/dL — ABNORMAL LOW (ref 13.0–17.0)
MCH: 31.6 pg (ref 26.0–34.0)
MCHC: 32.4 g/dL (ref 30.0–36.0)
MCV: 97.5 fL (ref 80.0–100.0)
Platelets: 315 10*3/uL (ref 150–400)
RBC: 3.99 MIL/uL — ABNORMAL LOW (ref 4.22–5.81)
RDW: 12.6 % (ref 11.5–15.5)
WBC: 14.3 10*3/uL — ABNORMAL HIGH (ref 4.0–10.5)
nRBC: 0 % (ref 0.0–0.2)

## 2023-08-22 LAB — BASIC METABOLIC PANEL WITH GFR
Anion gap: 10 (ref 5–15)
BUN: 22 mg/dL (ref 8–23)
CO2: 22 mmol/L (ref 22–32)
Calcium: 8.3 mg/dL — ABNORMAL LOW (ref 8.9–10.3)
Chloride: 104 mmol/L (ref 98–111)
Creatinine, Ser: 0.81 mg/dL (ref 0.61–1.24)
GFR, Estimated: >60 mL/min (ref >60)
Glucose, Bld: 122 mg/dL — ABNORMAL HIGH (ref 70–99)
Potassium: 3.7 mmol/L (ref 3.5–5.1)
Sodium: 136 mmol/L (ref 135–145)

## 2023-08-22 MED ORDER — CYANOCOBALAMIN 1000 MCG/ML IJ SOLN
1000.0000 ug | Freq: Once | INTRAMUSCULAR | Status: AC
  Administered 2023-08-22: 1000 ug via INTRAMUSCULAR
  Filled 2023-08-22: qty 1

## 2023-08-22 NOTE — TOC Initial Note (Addendum)
 Transition of Care [facility]) - Initial/Assessment Note    Patient Details  Name: Mark Brady MRN: [id number] Date of Birth: February 22, 1930  [facility]) CM/SW Contact:    Grandville Lax, LCSWA Phone Number: 08/22/2023, 11:43 AM  Clinical Narrative:                 CSW updated that PT is recommending SNF for pt. CSW spoke with pts wife who is at bedside to review recommendation. They are agreeable to SNF placement and prefer [facility] or [facility]. CSW to complete referral and send out for review. TOC to follow.   Addendum 2pm: CSW spoke with pts spouse to review bed offer from [facility], they would like to accept. Facility choice updated in HUB. CSW added facility choice to current pending SNF insurance auth. TOC to follow.   Expected Discharge Plan: Skilled Nursing Facility Barriers to Discharge: Continued Medical Work up   Patient Goals and CMS Choice Patient states their goals for this hospitalization and ongoing recovery are:: go to SNF CMS Medicare.gov Compare Post Acute Care list provided to:: Patient Choice offered to / list presented to : Patient, Spouse [place] ownership interest in [facility].provided to:: Spouse    Expected Discharge Plan and Services In-house Referral: Clinical Social Work Discharge Planning Services: CM Consult Post Acute Care Choice: Skilled Nursing Facility Living arrangements for the past 2 months: Single Family Home                                      Prior Living Arrangements/Services Living arrangements for the past 2 months: Single Family Home Lives with:: Spouse Patient language and need for interpreter reviewed:: Yes Do you feel safe going back to the place where you live?: Yes      Need for Family Participation in Patient Care: Yes (Comment) Care giver support system in place?: Yes (comment)   Criminal Activity/Legal Involvement Pertinent to Current Situation/Hospitalization: No - Comment as needed  Activities  of Daily Living   ADL Screening (condition at time of admission) Independently performs ADLs?: Yes (appropriate for developmental age) Is the patient deaf or have difficulty hearing?: No Does the patient have difficulty seeing, even when wearing glasses/contacts?: No Does the patient have difficulty concentrating, remembering, or making decisions?: No  Permission Sought/Granted                  Emotional Assessment Appearance:: Appears stated age Attitude/Demeanor/Rapport: Engaged Affect (typically observed): Accepting Orientation: : Oriented to Self, Oriented to Place Alcohol / Substance Use: Not Applicable Psych Involvement: No (comment)  Admission diagnosis:  Rib fractures [S22.49XA] Patient Active Problem List   Diagnosis Date Noted   Rib fractures 08/21/2023   Parkinson's disease dementia (HCC) 04/23/2023   Parkinson's disease (HCC) 07/24/2021   Sialorrhea 05/14/2020   Gastroesophageal reflux disease without esophagitis 05/13/2020   Glaucoma 11/23/2019   Diabetes (HCC) 10/19/2012   Essential hypertension, benign 09/28/2012   Hyperlipidemia 09/28/2012   PCP:  Myrna Ast, DO Pharmacy:   [facility] [phone] - [place], [place] - [address] AT SEC [address] & [address] [place] [address] Phone: [phone] Fax: [phone]     Social Drivers of Health (SDOH) Social History: SDOH Screenings   Food Insecurity: No Food Insecurity (08/21/2023)  Housing: Low Risk  (08/21/2023)  Transportation Needs: No Transportation Needs (  08/21/2023)  Utilities: Not At Risk (08/21/2023)  Alcohol Screen: Low Risk  (05/17/2023)  Depression (PHQ2-9): Low Risk  (05/29/2023)  Financial Resource Strain: Low Risk  (05/17/2023)  Physical Activity: Insufficiently Active (05/17/2023)  Social Connections: Moderately Integrated (08/21/2023)  Stress: No Stress Concern Present (05/17/2023)  Tobacco Use: Low Risk  (08/21/2023)  Health Literacy: Patient  Declined (05/17/2023)   SDOH Interventions:     Readmission Risk Interventions     No data to display

## 2023-08-22 NOTE — Progress Notes (Signed)
 Progress Note   Patient: Mark Brady:[id number] DOB: 05-Apr-1930 DOA: 08/21/2023     0 DOS: the patient was seen and examined on 08/22/2023   Brief hospital admission narrative:  Mark Brady is a 89 y.o. male with medical history significant of parkinson's disease, HLD, HTN, type 2 diabetes, GERD and increase intraocular pressure; who presented to ED after mechanical fall and right sided costal pain. Patient reported tripping and falling in his way to the bathroom this morning and ended hitting the side of the bed. Patient denies SOB, fever, dysuria, hematuria, lightheadedness, dizziness or any other complaints.   Work up in ED demonstrating positive 10-11 and 12 IBS broken on the right side. No intracranial abnormalities and otherwise demonstrating WBCs of 12.8, normal hemoglobin and normal platelet counts;Sodium 136, potassium 4.0, chloride 104, bicarb 23, BUN 22, creatinine 0.91 and normal LFTs. CK 78   PRN analgesics provided, incentive spirometry requested and TRH consulted to watch overnight regarding response to pain medication and further management.  Assessment and plan 1-mechanical fall - TSH within normal limits - B12 borderline low; repletion will be started - Continue supportive care and as needed analgesics - Physical therapy have seen patient with recommendation for short-term rehabilitation. - TOC on more and helping with discharge plans.  2-right-sided rib fractures - 10, 11 and 12 minimally displaced - Continue as needed analgesics and if needed muscle relaxants - Incentive spirometer has been ordered - Continue supportive care.  3-hypertension - Continue current antihypertensive agents - Follow vital signs.  4-elevated WBCs in the setting of a stress demargination - No signs of acute infection appreciated - Gentle fluid resuscitation were provided - Continue supportive care - Follow trend intermittently.  5-history of type 2 diabetes - Continue to  follow CBG fluctuation - As an outpatient following diet control only - Updated A1c pending  6-GERD - Continue PPI.  7-hyperlipidemia - Continue Pravachol .  8-increased intraocular pressure - Continue xalatan and cosopt  9-history of Parkinson disease - Continue Sinemet .   Subjective:  Hemodynamically stable and in no acute distress; no overnight events.  Patient overall reporting some improvement in his right-sided costal discomfort  with current analgesics.  Generally weak and deconditioned.  Physical Exam: Vitals:   08/21/23 1830 08/21/23 2028 08/22/23 0424 08/22/23 1232  BP: (!) 143/75 111/61 (!) 146/74 110/64  Pulse: 76 79 90 79  Resp: 18 20 18 20   Temp: 98.1 F (36.7 C) 98 F (36.7 C) 98.3 F (36.8 C) (!) 97.5 F (36.4 C)  TempSrc: Oral Oral  Oral  SpO2: 90% 98% 92% 96%  Weight:      Height:       General exam: Alert, awake, following commands appropriately; per wife at bedside weaker, more deconditioned, she is able to handle for assistance to his ADLs at the moment. Respiratory system: Good saturation on room air. Cardiovascular system:RRR. No rubs or gallops. Gastrointestinal system: Abdomen is nondistended, soft and nontender. No organomegaly or masses felt. Normal bowel sounds heard. Central nervous system: Moving 4 limbs spontaneously.  No focal neurological deficits. Extremities: No cyanosis or clubbing; complaining of right sided costal pain with movement and deep inspirations. Skin: No petechiae. Psychiatry: Mood & affect appropriate.    Data Reviewed: Basic metabolic panel: Sodium 136, potassium 3.7, chloride 100 bicarb 22, BUN 22, creatinine 0.81 and GFR >60 CBC: WBCs 14.3, hemoglobin 12.6 and platelet count 315K  Family Communication: Wife at bedside.  Disposition: Status is: Observation The patient remains OBS appropriate  and will d/c before 2 midnights.  Anticipating discharge to skilled nursing facility for short-term rehabilitation;  patient is medically stable.  Time spent: 35 minutes  Author: Justina Oman, MD 08/22/2023 6:47 PM  For on call review [email or web address].

## 2023-08-22 NOTE — Evaluation (Signed)
 Physical Therapy Evaluation Patient Details Name: Mark Brady MRN: [id number] DOB: [birth year] Today's Date: 08/22/2023  History of Present Illness  Mark Brady is a 89 y.o. male with medical history significant of parkinson's disease, HLD, HTN, type 2 diabetes, GERD and increase intraocular pressure; who presented to ED after mechanical fall and right sided costal pain.  Patient reported tripping and falling in his way to the bathroom this morning and ended hitting the side of the bed. Patient denies SOB, fever, dysuria, hematuria, lightheadedness, dizziness or any other complaints.   Clinical Impression  Patient demonstrates slow labored movement for sitting up at bedside with c/o severe pain right side of rib cage, very unsteady on feet having to use RW, limited to ambulating in room due to BLE weakness, increasing right sided rib pain and fall risk. Patient tolerated sitting up in chair after therapy. Patient will benefit from continued skilled physical therapy in hospital and recommended venue below to increase strength, balance, endurance for safe ADLs and gait.           If plan is discharge home, recommend the following: A lot of help with bathing/dressing/bathroom;A lot of help with walking and/or transfers;Help with stairs or ramp for entrance;Assistance with cooking/housework   Can travel by private vehicle   No    Equipment Recommendations None recommended by PT  Recommendations for Other Services       Functional Status Assessment Patient has had a recent decline in their functional status and demonstrates the ability to make significant improvements in function in a reasonable and predictable amount of time.     Precautions / Restrictions Precautions Precautions: Fall Restrictions Weight Bearing Restrictions Per Provider Order: No      Mobility  Bed Mobility Overal bed mobility: Needs Assistance Bed Mobility: Supine to Sit, Sit to Supine     Supine to  sit: Mod assist Sit to supine: Mod assist   General bed mobility comments: slow labored movement with c/o increased pain right side of rib cage    Transfers Overall transfer level: Needs assistance Equipment used: Rolling walker (2 wheels), None Transfers: Sit to/from Stand, Bed to chair/wheelchair/BSC Sit to Stand: Mod assist   Step pivot transfers: Mod assist       General transfer comment: very unsteady on feet having to lean on armrest of chair for support when transferring without AD, safer using RW    Ambulation/Gait Ambulation/Gait assistance: Mod assist Gait Distance (Feet): 18 Feet Assistive device: Rolling walker (2 wheels) Gait Pattern/deviations: Decreased step length - right, Decreased step length - left, Decreased stride length Gait velocity: slow     General Gait Details: limited to taking steps in room with slow labored unsteady movement using RW, limited mostly due to right sided rib cage pain and fatigue  Stairs            Wheelchair Mobility     Tilt Bed    Modified Rankin (Stroke Patients Only)       Balance Overall balance assessment: Needs assistance Sitting-balance support: Feet supported, No upper extremity supported Sitting balance-Leahy Scale: Fair Sitting balance - Comments: seated at EOB   Standing balance support: During functional activity, No upper extremity supported Standing balance-Leahy Scale: Poor Standing balance comment: fair/poor using RW                             Pertinent Vitals/Pain Pain Assessment Pain Assessment: Faces Faces Pain Scale: Hurts  even more Pain Location: right side of rib cage Pain Descriptors / Indicators: Discomfort, Grimacing, Guarding, Sore Pain Intervention(s): Limited activity within patient's tolerance, Monitored during session, Repositioned    Home Living Family/patient expects to be discharged to:: Private residence Living Arrangements: Spouse/significant other Available  Help at Discharge: Family;Available PRN/intermittently Type of Home: House Home Access: Stairs to enter Entrance Stairs-Rails: Right;Left;Can reach both Entrance Stairs-Number of Steps: 7   Home Layout: One level Home Equipment: Agricultural consultant (2 wheels);Cane - single point      Prior Function Prior Level of Function : Needs assist       Physical Assist : Mobility (physical);ADLs (physical) Mobility (physical): Bed mobility;Transfers;Gait;Stairs   Mobility Comments: household and short distanced community ambulation without AD, does not drive ADLs Comments: Independent household, assisted for community ADLs by family     Extremity/Trunk Assessment   Upper Extremity Assessment Upper Extremity Assessment: Generalized weakness    Lower Extremity Assessment Lower Extremity Assessment: Generalized weakness    Cervical / Trunk Assessment Cervical / Trunk Assessment: Kyphotic  Communication   Communication Communication: No apparent difficulties    Cognition Arousal: Alert Behavior During Therapy: WFL for tasks assessed/performed, Impulsive   PT - Cognitive impairments: Safety/Judgement                       PT - Cognition Comments: requireds repeated verbal, tactile cueing for safety Following commands: Impaired Following commands impaired: Follows one step commands with increased time     Cueing Cueing Techniques: Verbal cues, Tactile cues     General Comments      Exercises     Assessment/Plan    PT Assessment Patient needs continued PT services  PT Problem List Decreased strength;Decreased activity tolerance;Decreased balance;Decreased mobility       PT Treatment Interventions DME instruction;Gait training;Stair training;Functional mobility training;Therapeutic activities;Therapeutic exercise;Balance training;Patient/family education    PT Goals (Current goals can be found in the Care Plan section)  Acute Rehab PT Goals Patient Stated Goal:  return home after rehab PT Goal Formulation: With patient/family Time For Goal Achievement: 09/05/23 Potential to Achieve Goals: Good    Frequency Min 3X/week     Co-evaluation               AM-PAC PT "6 Clicks" Mobility  Outcome Measure Help needed turning from your back to your side while in a flat bed without using bedrails?: A Lot Help needed moving from lying on your back to sitting on the side of a flat bed without using bedrails?: A Lot Help needed moving to and from a bed to a chair (including a wheelchair)?: A Lot Help needed standing up from a chair using your arms (e.g., wheelchair or bedside chair)?: A Little Help needed to walk in hospital room?: A Lot Help needed climbing 3-5 steps with a railing? : A Lot 6 Click Score: 13    End of Session   Activity Tolerance: Patient tolerated treatment well;Patient limited by fatigue;Patient limited by pain Patient left: in chair;with call bell/phone within reach;with family/visitor present Nurse Communication: Mobility status PT Visit Diagnosis: Unsteadiness on feet (R26.81);Other abnormalities of gait and mobility (R26.89);Muscle weakness (generalized) (M62.81)    Time: 1610-9604 PT Time Calculation (min) (ACUTE ONLY): 24 min   Charges:   PT Evaluation $PT Eval Moderate Complexity: 1 Mod PT Treatments $Therapeutic Activity: 23-37 mins PT General Charges $$ ACUTE PT VISIT: 1 Visit         12:07 PM, 08/22/23 Royston Cornea  Candee Cha, MPT Physical Therapist with [facility] [phone] office [phone] mobile phone

## 2023-08-22 NOTE — Plan of Care (Signed)
  Problem: Acute Rehab PT Goals(only PT should resolve) Goal: Pt Will Go Supine/Side To Sit Outcome: Progressing Flowsheets (Taken 08/22/2023 1209) Pt will go Supine/Side to Sit: with minimal assist Goal: Patient Will Transfer Sit To/From Stand Outcome: Progressing Flowsheets (Taken 08/22/2023 1209) Patient will transfer sit to/from stand: with minimal assist Goal: Pt Will Transfer Bed To Chair/Chair To Bed Outcome: Progressing Flowsheets (Taken 08/22/2023 1209) Pt will Transfer Bed to Chair/Chair to Bed: with min assist Goal: Pt Will Ambulate Outcome: Progressing Flowsheets (Taken 08/22/2023 1209) Pt will Ambulate:  25 feet  with minimal assist  with rolling walker   12:09 PM, 08/22/23 Walton Guppy, MPT Physical Therapist with [facility] [phone] office [phone] mobile phone

## 2023-08-22 NOTE — Progress Notes (Signed)
 Pt abdomen is taut and distended. Notified MD. Continue to monitor

## 2023-08-22 NOTE — NC FL2 (Signed)
 [place]  MEDICAID FL2 LEVEL OF CARE FORM     IDENTIFICATION  Patient Name: Mark Brady Birthdate: [birth year] Sex: male Admission Date (Current Location): 08/21/2023  [facility] and [place] Number:  [employer] and Address:  [facility],  [address] [place] [address]      Provider Number: [id number]  Attending Physician Name and Address:  Justina Oman, MD  Relative Name and Phone Number:       Current Level of Care: Hospital Recommended Level of Care: Skilled Nursing Facility Prior Approval Number:    Date Approved/Denied:   PASRR Number: [id number]  Discharge Plan: SNF    Current Diagnoses: Patient Active Problem List   Diagnosis Date Noted   Rib fractures 08/21/2023   Parkinson's disease dementia (HCC) 04/23/2023   Parkinson's disease (HCC) 07/24/2021   Sialorrhea 05/14/2020   Gastroesophageal reflux disease without esophagitis 05/13/2020   Glaucoma 11/23/2019   Diabetes (HCC) 10/19/2012   Essential hypertension, benign 09/28/2012   Hyperlipidemia 09/28/2012    Orientation RESPIRATION BLADDER Height & Weight     Self, Place, Situation  Normal Incontinent Weight: 168 lb (76.2 kg) Height:  5\' 6"  (167.6 cm)  BEHAVIORAL SYMPTOMS/MOOD NEUROLOGICAL BOWEL NUTRITION STATUS      Continent Diet (See D/C summary)  AMBULATORY STATUS COMMUNICATION OF NEEDS Skin   Extensive Assist Verbally Normal                       Personal Care Assistance Level of Assistance  Bathing, Feeding, Dressing Bathing Assistance: Limited assistance Feeding assistance: Independent Dressing Assistance: Limited assistance     Functional Limitations Info  Sight, Hearing, Speech Sight Info: Impaired Hearing Info: Impaired Speech Info: Adequate    SPECIAL CARE FACTORS FREQUENCY  PT (By licensed PT), OT (By licensed OT)     PT Frequency: 5 times weekly OT Frequency: 5 times weekly            Contractures Contractures Info: Not present     Additional Factors Info  Code Status, Allergies Code Status Info: FULL Allergies Info: Penicillins, Aspirin            Current Medications (08/22/2023):  This is the current hospital active medication list Current Facility-Administered Medications  Medication Dose Route Frequency Provider Last Rate Last Admin   0.9 %  sodium chloride  infusion   Intravenous Continuous Justina Oman, MD   Stopped at 08/22/23 0930   acetaminophen  (TYLENOL ) tablet 650 mg  650 mg Oral Q6H PRN Justina Oman, MD       Or   acetaminophen  (TYLENOL ) suppository 650 mg  650 mg Rectal Q6H PRN Justina Oman, MD       amLODipine  (NORVASC ) tablet 10 mg  10 mg Oral Daily Justina Oman, MD   10 mg at 08/22/23 7425   carbidopa -levodopa  (SINEMET  IR) 25-100 MG per tablet immediate release 2 tablet  2 tablet Oral TID Justina Oman, MD   2 tablet at 08/22/23 1108   dorzolamide-timolol (COSOPT) 2-0.5 % ophthalmic solution 1 drop  1 drop Both Eyes Daily Justina Oman, MD   1 drop at 08/21/23 2140   enoxaparin (LOVENOX) injection 40 mg  40 mg Subcutaneous Q24H Justina Oman, MD   40 mg at 08/21/23 2140   hydrALAZINE (APRESOLINE) injection 10 mg  10 mg Intravenous Q6H PRN Justina Oman, MD       HYDROmorphone (DILAUDID) injection 0.5-1 mg  0.5-1 mg Intravenous Q4H PRN Justina Oman, MD  latanoprost (XALATAN) 0.005 % ophthalmic solution 1 drop  1 drop Both Eyes QHS Justina Oman, MD   1 drop at 08/21/23 2140   lidocaine  (LIDODERM ) 5 % 1 patch  1 patch Transdermal Q24H Ninetta Basket, MD   1 patch at 08/22/23 0847   LORazepam  (ATIVAN ) tablet 0.25 mg  0.25 mg Oral Q8H PRN Justina Oman, MD   0.25 mg at 08/21/23 2212   ondansetron  (ZOFRAN ) tablet 4 mg  4 mg Oral Q6H PRN Justina Oman, MD       Or   ondansetron  (ZOFRAN ) injection 4 mg  4 mg Intravenous Q6H PRN Justina Oman, MD       oxyCODONE (Oxy IR/ROXICODONE) immediate release tablet 5 mg  5 mg Oral Q6H PRN Justina Oman, MD   5 mg at 08/21/23 1842    pantoprazole  (PROTONIX ) EC tablet 40 mg  40 mg Oral Daily Justina Oman, MD   40 mg at 08/22/23 1610   pravastatin  (PRAVACHOL ) tablet 40 mg  40 mg Oral QHS Justina Oman, MD   40 mg at 08/21/23 2200     Discharge Medications: Please see discharge summary for a list of discharge medications.  Relevant Imaging Results:  Relevant Lab Results:   Additional Information SSN: [id number] LCSWA

## 2023-08-22 NOTE — Plan of Care (Signed)

## 2023-08-23 DIAGNOSIS — Z8669 Personal history of other diseases of the nervous system and sense organs: Secondary | ICD-10-CM

## 2023-08-23 DIAGNOSIS — S2241XA Multiple fractures of ribs, right side, initial encounter for closed fracture: Secondary | ICD-10-CM | POA: Diagnosis not present

## 2023-08-23 MED ORDER — ACETAMINOPHEN 500 MG PO TABS: 1000.0000 mg | ORAL_TABLET | Freq: Three times a day (TID) | ORAL | Status: DC | PRN

## 2023-08-23 MED ORDER — OXYCODONE HCL 5 MG PO TABS: 5.0000 mg | ORAL_TABLET | Freq: Four times a day (QID) | ORAL | 0 refills | Status: DC | PRN

## 2023-08-23 MED ORDER — LIDOCAINE 5 % EX PTCH: 1.0000 | MEDICATED_PATCH | CUTANEOUS | 0 refills | Status: DC

## 2023-08-23 MED ORDER — VITAMIN B-12 1000 MCG PO TABS: 1000.0000 ug | ORAL_TABLET | Freq: Every day | ORAL | Status: DC

## 2023-08-23 NOTE — TOC Transition Note (Signed)
 Transition of Care [facility]) - Discharge Note   Patient Details  Name: Mark Brady MRN: [id number] Date of Birth: [birth year]  Transition of [facility]) CM/SW Contact:  Grandville Lax, LCSWA Phone Number: 08/23/2023, 1:56 PM   Clinical Narrative:    CSW updated that insurance [employer] has been approved for SNF at [facility]. CSW spoke to [place] in admissions who states they are able to accept today. Kerri to follow with pts wife to complete admissions paperwork. RN provided with room and report number. TOC signing off.   Final next level of care: Skilled Nursing Facility Barriers to Discharge: Barriers Resolved   Patient Goals and CMS Choice Patient states their goals for this hospitalization and ongoing recovery are:: go to SNF CMS Medicare.gov Compare Post Acute Care list provided to:: Patient Choice offered to / list presented to : Patient, Spouse [place] ownership interest in [facility].provided to:: Spouse    Discharge Placement                Patient to be transferred to facility by: facility staff Name of family member notified: wife Patient and family notified of of transfer: 08/23/23  Discharge Plan and Services Additional resources added to the After Visit Summary for   In-house Referral: Clinical Social Work Discharge Planning Services: CM Consult Post Acute Care Choice: Skilled Nursing Facility                               Social Drivers of Health (SDOH) Interventions SDOH Screenings   Food Insecurity: No Food Insecurity (08/21/2023)  Housing: Low Risk  (08/21/2023)  Transportation Needs: No Transportation Needs (08/21/2023)  Utilities: Not At Risk (08/21/2023)  Alcohol Screen: Low Risk  (05/17/2023)  Depression (PHQ2-9): Low Risk  (05/29/2023)  Financial Resource Strain: Low Risk  (05/17/2023)  Physical Activity: Insufficiently Active (05/17/2023)  Social Connections: Moderately Integrated (08/21/2023)  Stress: No Stress Concern Present (05/17/2023)   Tobacco Use: Low Risk  (08/21/2023)  Health Literacy: Patient Declined (05/17/2023)     Readmission Risk Interventions     No data to display

## 2023-08-23 NOTE — Progress Notes (Addendum)
 Nsg Discharge Note  Admit Date:  08/21/2023 Discharge date: 08/23/2023   Khadim L Erway to be D/C'd [facility] per MD order.  AVS completed.   Patient/caregiver able to verbalize understanding.  Discharge Medication: Allergies as of 08/23/2023       Reactions   Penicillins    Aspirin  Rash        Medication List     STOP taking these medications    LORazepam  0.5 MG tablet Commonly known as: Ativan        TAKE these medications    Accu-Chek Aviva Plus test strip Generic drug: glucose blood USE TO CHECK BLOOD SUGAR DAILY   Accu-Chek Softclix Lancets lancets USE TO CHECK BLOOD SUGAR DAILY   acetaminophen  500 MG tablet Commonly known as: TYLENOL  Take 2 tablets (1,000 mg total) by mouth every 8 (eight) hours as needed for mild pain (pain score 1-3) or headache (or Fever >/= 101).   amLODipine  10 MG tablet Commonly known as: NORVASC  Take 1 tablet (10 mg total) by mouth daily.   carbidopa -levodopa  25-100 MG tablet Commonly known as: SINEMET  IR Take 2 tablets by mouth 3 (three) times daily. 7am/11am/4pm   cyanocobalamin  1000 MCG tablet Commonly known as: VITAMIN B12 Take 1 tablet (1,000 mcg total) by mouth daily.   dorzolamide -timolol  2-0.5 % ophthalmic solution Commonly known as: COSOPT  Place 1 drop into both eyes daily.   latanoprost  0.005 % ophthalmic solution Commonly known as: XALATAN  SMARTSIG:1 Drop(s) In Eye(s) Every Evening   lidocaine  5 % Commonly known as: LIDODERM  Place 1 patch onto the skin daily. Remove & Discard patch within 12 hours; apply to affected area (right sided costal zone) Start taking on: Aug 24, 2023   losartan  100 MG tablet Commonly known as: COZAAR  TAKE 1 TABLET(100 MG) BY MOUTH DAILY   oxyCODONE  5 MG immediate release tablet Commonly known as: Oxy IR/ROXICODONE  Take 1 tablet (5 mg total) by mouth every 6 (six) hours as needed for severe pain (pain score 7-10).   pantoprazole  40 MG tablet Commonly known as: PROTONIX  TAKE 1 TABLET(40  MG) BY MOUTH DAILY   pravastatin  40 MG tablet Commonly known as: PRAVACHOL  TAKE 1 TABLET(40 MG) BY MOUTH DAILY   PreviDent 5000 Booster Plus 1.1 % Pste Generic drug: Sodium Fluoride        Discharge Assessment: Vitals:   08/23/23 0558 08/23/23 0917  BP: 118/73 122/65  Pulse: 86   Resp: 17   Temp: 98.6 F (37 C)   SpO2: 90%    Skin clean, dry and intact without evidence of skin break down, no evidence of skin tears noted. IV catheter discontinued intact. Site without signs and symptoms of complications - no redness or edema noted at insertion site, patient denies c/o pain - only slight tenderness at site.  Dressing with slight pressure applied.  D/c Instructions-Education: Discharge instructions given to patient/family with verbalized understanding. D/c education completed with patient/family including follow up instructions, medication list, d/c activities limitations if indicated, with other d/c instructions as indicated by MD - patient able to verbalize understanding, all questions fully answered. Patient instructed to return to ED, call 911, or call MD for any changes in condition.  Patient escorted via WC, and D/C home via private auto.  Lilton Relic, RN 08/23/2023 2:50 PM

## 2023-08-23 NOTE — Plan of Care (Signed)

## 2023-08-23 NOTE — Discharge Summary (Signed)
 Physician Discharge Summary   Patient: Mark Brady MRN: [id number] DOB: [birth year]  Admit date:     08/21/2023  Discharge date: 08/23/23  Discharge Physician: Justina Oman   PCP: Cook, Jayce G, DO   Recommendations at discharge:  Repeat basic metabolic panel to follow electrolytes and renal function Reassess blood pressure and adjust antihypertensive treatment as needed. Repeat CBC to follow hemoglobin/WBCs trend.  Discharge Diagnoses: Principal Problem:   Rib fractures Active Problems:   History of Parkinson's disease Hypertension Gastroesophageal reflux disease Elevated WBCs Hyperlipidemia Increased intraocular pressure History of Parkinson disease  Brief hospital admission narrative:  Mark Brady is a 89 y.o. male with medical history significant of parkinson's disease, HLD, HTN, type 2 diabetes, GERD and increase intraocular pressure; who presented to ED after mechanical fall and right sided costal pain. Patient reported tripping and falling in his way to the bathroom this morning and ended hitting the side of the bed. Patient denies SOB, fever, dysuria, hematuria, lightheadedness, dizziness or any other complaints.   Work up in ED demonstrating positive 10-11 and 12 IBS broken on the right side. No intracranial abnormalities and otherwise demonstrating WBCs of 12.8, normal hemoglobin and normal platelet counts;Sodium 136, potassium 4.0, chloride 104, bicarb 23, BUN 22, creatinine 0.91 and normal LFTs. CK 78   PRN analgesics provided, incentive spirometry requested and TRH consulted to watch overnight regarding response to pain medication and further management.  Assessment and Plan: 1-mechanical fall - TSH within normal limits - B12 borderline low; repletion will be started - Continue supportive care and as needed analgesics - Physical therapy have seen patient with recommendation for short-term rehabilitation. - TOC on more and helping with discharge plans.    2-right-sided rib fractures - 10, 11 and 12 minimally displaced - Continue as needed analgesics and if needed muscle relaxants - Incentive spirometer has been ordered - Continue supportive care.   3-hypertension - Continue current antihypertensive agents - Continue to follow vital signs.   4-elevated WBCs in the setting of a stress demargination - No signs of acute infection appreciated - Gentle fluid resuscitation were provided - Continue supportive care - Follow trend intermittently with repeat CBC at follow-up visit.Mark Brady   5-history of type 2 diabetes - Continue to follow CBG fluctuation - As an outpatient following diet control only - Follow final results of A1c   6-GERD - Continue PPI.   7-hyperlipidemia - Continue Pravachol .   8-increased intraocular pressure - Continue xalatan  and cosopt    9-history of Parkinson disease - Continue Sinemet .   Consultants: None Procedures performed: See below for x-ray reports Disposition: Skilled nursing facility Diet recommendation: Heart healthy/modified [place] diet.  DISCHARGE MEDICATION: Allergies as of 08/23/2023       Reactions   Penicillins    Aspirin  Rash        Medication List     STOP taking these medications    LORazepam  0.5 MG tablet Commonly known as: Ativan        TAKE these medications    Accu-Chek Aviva Plus test strip Generic drug: glucose blood USE TO CHECK BLOOD SUGAR DAILY   Accu-Chek Softclix Lancets lancets USE TO CHECK BLOOD SUGAR DAILY   acetaminophen  500 MG tablet Commonly known as: TYLENOL  Take 2 tablets (1,000 mg total) by mouth every 8 (eight) hours as needed for mild pain (pain score 1-3) or headache (or Fever >/= 101).   amLODipine  10 MG tablet Commonly known as: NORVASC  Take 1 tablet (10 mg total) by mouth daily.  carbidopa -levodopa  25-100 MG tablet Commonly known as: SINEMET  IR Take 2 tablets by mouth 3 (three) times daily. 7am/11am/4pm   cyanocobalamin  1000 MCG  tablet Commonly known as: VITAMIN B12 Take 1 tablet (1,000 mcg total) by mouth daily.   dorzolamide -timolol  2-0.5 % ophthalmic solution Commonly known as: COSOPT  Place 1 drop into both eyes daily.   latanoprost  0.005 % ophthalmic solution Commonly known as: XALATAN  SMARTSIG:1 Drop(s) In Eye(s) Every Evening   lidocaine  5 % Commonly known as: LIDODERM  Place 1 patch onto the skin daily. Remove & Discard patch within 12 hours; apply to affected area (right sided costal zone) Start taking on: Aug 24, 2023   losartan  100 MG tablet Commonly known as: COZAAR  TAKE 1 TABLET(100 MG) BY MOUTH DAILY   oxyCODONE  5 MG immediate release tablet Commonly known as: Oxy IR/ROXICODONE  Take 1 tablet (5 mg total) by mouth every 6 (six) hours as needed for severe pain (pain score 7-10).   pantoprazole  40 MG tablet Commonly known as: PROTONIX  TAKE 1 TABLET(40 MG) BY MOUTH DAILY   pravastatin  40 MG tablet Commonly known as: PRAVACHOL  TAKE 1 TABLET(40 MG) BY MOUTH DAILY   PreviDent 5000 Booster Plus 1.1 % Pste Generic drug: Sodium Fluoride        Contact information for follow-up providers     Cook, Jayce G, DO. Schedule an appointment as soon as possible for a visit in 10 day(s).   Specialty: Family Medicine Why: After discharge from the skilled nursing facility. Contact information: [address] [place] [address] [phone]              Contact information for after-discharge care     Destination     [facility] .   Service: Skilled Nursing Contact information: [address] [place]  [address] [phone]                    Discharge Exam: Mark Brady Weights   08/21/23 1343  Weight: 76.2 kg   General exam: Alert, awake, following commands appropriately; per wife at bedside weaker, more deconditioned, she is able to handle for assistance to his ADLs at the moment. Respiratory system: Good saturation on  room air. Cardiovascular system:RRR. No rubs or gallops. Gastrointestinal system: Abdomen is nondistended, soft and nontender. No organomegaly or masses felt. Normal bowel sounds heard. Central nervous system: Moving 4 limbs spontaneously.  No focal neurological deficits. Extremities: No cyanosis or clubbing; complaining of right sided costal pain with movement and deep inspirations. Skin: No petechiae. Psychiatry: Mood & affect appropriate.   Condition at discharge: Stable and improved.  The results of significant diagnostics from this hospitalization (including imaging, microbiology, ancillary and laboratory) are listed below for reference.   Imaging Studies: CT CHEST ABDOMEN PELVIS W CONTRAST Result Date: 08/21/2023 CLINICAL DATA:  Polytrauma, blunt R flank pain sp fall. Redness and bruising to right flank area. EXAM: CT CHEST, ABDOMEN, AND PELVIS WITH CONTRAST TECHNIQUE: Multidetector CT imaging of the chest, abdomen and pelvis was performed following the standard protocol during bolus administration of intravenous contrast. RADIATION DOSE REDUCTION: This exam was performed according to the departmental dose-optimization program which includes automated exposure control, adjustment of the mA and/or kV according to patient size and/or use of iterative reconstruction technique. CONTRAST:  OMNIPAQUE  IOHEXOL  300 MG/ML  SOLN COMPARISON:  CT scan abdomen from 06/27/2007 and CT scan chest, abdomen and pelvis from 03/31/2005. FINDINGS: CT CHEST FINDINGS Cardiovascular: Normal cardiac size. No pericardial effusion.  No aortic aneurysm. There are coronary artery calcifications, in keeping with coronary artery disease. There are also mild-to-moderate peripheral atherosclerotic vascular calcifications of thoracic aorta and its major branches. Mediastinum/Nodes: Visualized thyroid  gland appears grossly unremarkable. No solid / cystic mediastinal masses. The esophagus is nondistended precluding optimal  assessment. No axillary, mediastinal or hilar lymphadenopathy by size criteria. Lungs/Pleura: The central tracheo-bronchial tree is patent. There are dependent changes in bilateral lungs. No mass or consolidation. No pleural effusion or pneumothorax. There is a sub 4 mm solid noncalcified nodule in the left lung upper lobe (series 5, image 43), which is present since the prior study from 2006 and favored benign. No new or suspicious lung nodule. Musculoskeletal: The visualized soft tissues of the chest wall are grossly unremarkable. No suspicious osseous lesions. There are mild to moderate multilevel degenerative changes in the visualized spine. There are mildly displaced fractures of posterolateral right ninth through eleventh ribs. CT ABDOMEN PELVIS FINDINGS Hepatobiliary: The liver is normal in size. Non-cirrhotic configuration. These is diffuse hepatic steatosis. No suspicious mass. Note is made of at least 2, subcentimeter sized hypoattenuating foci, which are too small to adequately characterize. No intrahepatic or extrahepatic bile duct dilation. No calcified gallstones. Normal gallbladder wall thickness. No pericholecystic inflammatory changes. Pancreas: Small/atrophic pancreas. No peripancreatic fat stranding. There are several hypoattenuating structures in the pancreatic body (largest measuring up to 8 x 11 mm) and uncinate process measuring up to 10 x 15 mm, which are incompletely characterized on the current exam. The 2 adjacent lesions in the pancreatic body were not distinctly visualized on the prior study from 2009. However, the lesion in the uncinate process is present since the prior study from 2009. 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 Correlate clinically to determine the need for follow-up examination with MRI/MRCP in 2 years. Spleen: Within normal limits. No focal lesion. Adrenals/Urinary Tract: Adrenal glands are unremarkable.  No suspicious renal mass. No hydronephrosis. No renal or ureteric calculi. Unremarkable urinary bladder. Stomach/Bowel: There is a tiny sliding hiatal hernia. No disproportionate dilation of the small or large bowel loops. No evidence of abnormal bowel wall thickening or inflammatory changes. The appendix is unremarkable. There are scattered diverticula mainly in the sigmoid colon, without imaging signs of diverticulitis. Vascular/Lymphatic: No ascites or pneumoperitoneum. No abdominal or pelvic lymphadenopathy, by size criteria. No aneurysmal dilation of the major abdominal arteries. There are moderate peripheral atherosclerotic vascular calcifications of the aorta and its major branches. Reproductive: Enlarged prostate. Correlation with serum PSA levels is recommended. Symmetric seminal vesicles. Other: The visualized soft tissues and abdominal wall are unremarkable. Musculoskeletal: No suspicious osseous lesions. There are mild - moderate multilevel degenerative changes in the visualized spine. IMPRESSION: 1. Mildly displaced fractures of posterolateral right ninth through eleventh ribs. Otherwise, no traumatic injury to the chest, abdomen or pelvis. 2. Severe pancreatic lesions and markedly enlarged prostate gland, as discussed above. 3. Multiple other nonacute observations, as described above. Aortic Atherosclerosis (ICD10-I70.0). Electronically Signed   By: Beula Brunswick M.D.   On: 08/21/2023 09:39   CT HEAD WO CONTRAST Result Date: 08/21/2023 CLINICAL DATA:  Provided history: Head trauma, moderate/severe. Polytrauma, blunt. Additional history provided: Fall. EXAM: CT HEAD WITHOUT CONTRAST CT CERVICAL SPINE WITHOUT CONTRAST TECHNIQUE: Multidetector CT imaging of the head and cervical spine was performed following the standard protocol without intravenous contrast. Multiplanar CT image reconstructions of the cervical spine were also generated. RADIATION DOSE REDUCTION: This exam was performed according to  the departmental  dose-optimization program which includes automated exposure control, adjustment of the mA and/or kV according to patient size and/or use of iterative reconstruction technique. COMPARISON:  Head CT 12/05/2022.  Cervical spine CT 12/05/2022. FINDINGS: CT HEAD FINDINGS Brain: Generalized cerebral atrophy. 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 Background moderate-to-advanced patchy and ill-defined hypoattenuation within the cerebral white matter, nonspecific but compatible with chronic small vessel ischemic disease. There is no acute intracranial hemorrhage. No demarcated cortical infarct. No extra-axial fluid collection. No evidence of an intracranial mass. No midline shift. Vascular: No hyperdense vessel.  Atherosclerotic calcifications. Skull: No calvarial fracture or aggressive osseous lesion. Sinuses/Orbits: No mass or acute finding within the imaged orbits. 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 CT CERVICAL SPINE FINDINGS Alignment: Nonspecific reversal of the expected cervical or doses. 2 mm C3-C4 grade 1 anterolisthesis. 3 mm C4-C5 grade 1 anterolisthesis. 2 mm C5-C6 grade 1 anterolisthesis. Skull base and vertebrae: The basion-dental and atlanto-dental intervals are maintained.No evidence of acute fracture to the cervical spine. Facet ankylosis on the right at C3-C4. Soft tissues and spinal canal: No prevertebral fluid or swelling. No visible canal hematoma. Disc levels: Cervical spondylosis with multilevel disc space narrowing, disc bulges/central disc protrusions, uncovertebral hypertrophy and facet arthropathy. Disc space narrowing is greatest at C6-C7 (moderate to advanced at this level). No appreciable high-grade spinal canal stenosis. Multilevel bony neural foraminal narrowing. Degenerative changes also present at the C1-C2 articulation. Multilevel ventral osteophytes. Upper chest: No consolidation  within the imaged lung apices. No visible pneumothorax. IMPRESSION: CT head: 1.  No evidence of an acute intracranial abnormality. 2. Parenchymal atrophy and chronic small vessel ischemic disease. 3. Chronic severe left sphenoid sinusitis. CT cervical spine: 1. No evidence of an acute cervical spine fracture. 2. Nonspecific reversal of the expected cervical lordosis. 3. Grade 1 anterolisthesis at C3-C4, C4-C5 and C5-C6, unchanged from the prior cervical spine CT of 12/05/2022. 4. Cervical spondylosis as described. 5. Facet ankylosis on the right at C3-C4. Electronically Signed   By: Bascom Lily D.O.   On: 08/21/2023 09:29   CT CERVICAL SPINE WO CONTRAST Result Date: 08/21/2023 CLINICAL DATA:  Provided history: Head trauma, moderate/severe. Polytrauma, blunt. Additional history provided: Fall. EXAM: CT HEAD WITHOUT CONTRAST CT CERVICAL SPINE WITHOUT CONTRAST TECHNIQUE: Multidetector CT imaging of the head and cervical spine was performed following the standard protocol without intravenous contrast. Multiplanar CT image reconstructions of the cervical spine were also generated. RADIATION DOSE REDUCTION: This exam was performed according to the departmental dose-optimization program which includes automated exposure control, adjustment of the mA and/or kV according to patient size and/or use of iterative reconstruction technique. COMPARISON:  Head CT 12/05/2022.  Cervical spine CT 12/05/2022. FINDINGS: CT HEAD FINDINGS Brain: Generalized cerebral atrophy. 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 Background moderate-to-advanced patchy and ill-defined hypoattenuation within the cerebral white matter, nonspecific but compatible with chronic small vessel ischemic disease. There is no acute intracranial hemorrhage. No demarcated cortical infarct. No extra-axial fluid collection. No evidence of an intracranial mass. No midline shift. Vascular: No hyperdense vessel.  Atherosclerotic  calcifications. Skull: No calvarial fracture or aggressive osseous lesion. Sinuses/Orbits: No mass or acute finding within the imaged orbits. 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 CT CERVICAL SPINE FINDINGS Alignment: Nonspecific reversal of the expected cervical or doses. 2 mm C3-C4 grade 1 anterolisthesis. 3 mm C4-C5 grade 1 anterolisthesis. 2 mm C5-C6 grade 1 anterolisthesis. Skull base and vertebrae: The basion-dental  and atlanto-dental intervals are maintained.No evidence of acute fracture to the cervical spine. Facet ankylosis on the right at C3-C4. Soft tissues and spinal canal: No prevertebral fluid or swelling. No visible canal hematoma. Disc levels: Cervical spondylosis with multilevel disc space narrowing, disc bulges/central disc protrusions, uncovertebral hypertrophy and facet arthropathy. Disc space narrowing is greatest at C6-C7 (moderate to advanced at this level). No appreciable high-grade spinal canal stenosis. Multilevel bony neural foraminal narrowing. Degenerative changes also present at the C1-C2 articulation. Multilevel ventral osteophytes. Upper chest: No consolidation within the imaged lung apices. No visible pneumothorax. IMPRESSION: CT head: 1.  No evidence of an acute intracranial abnormality. 2. Parenchymal atrophy and chronic small vessel ischemic disease. 3. Chronic severe left sphenoid sinusitis. CT cervical spine: 1. No evidence of an acute cervical spine fracture. 2. Nonspecific reversal of the expected cervical lordosis. 3. Grade 1 anterolisthesis at C3-C4, C4-C5 and C5-C6, unchanged from the prior cervical spine CT of 12/05/2022. 4. Cervical spondylosis as described. 5. Facet ankylosis on the right at C3-C4. Electronically Signed   By: Bascom Lily D.O.   On: 08/21/2023 09:29    Microbiology: No results found for this or any previous visit.  Labs: CBC: Recent Labs  Lab 08/21/23 0737 08/22/23 0422  WBC 12.8* 14.3*   NEUTROABS 10.0*  --   HGB 14.1 12.6*  HCT 40.8 38.9*  MCV 96.5 97.5  PLT 371 315   Basic Metabolic Panel: Recent Labs  Lab 08/21/23 0737 08/22/23 0422  NA 136 136  K 4.0 3.7  CL 104 104  CO2 23 22  GLUCOSE 140* 122*  BUN 22 22  CREATININE 0.91 0.81  CALCIUM 9.2 8.3*   Liver Function Tests: Recent Labs  Lab 08/21/23 0737  AST 15  ALT 14  ALKPHOS 69  BILITOT 0.5  PROT 7.5  ALBUMIN 4.1   CBG: No results for input(s): "GLUCAP" in the last 168 hours.  Discharge time spent: greater than 30 minutes.  Signed: Justina Oman, MD [facility] 08/23/2023

## 2023-08-26 ENCOUNTER — Non-Acute Institutional Stay (SKILLED_NURSING_FACILITY): Payer: Self-pay | Admitting: Adult Health

## 2023-08-26 ENCOUNTER — Inpatient Hospital Stay (HOSPITAL_COMMUNITY)
Admission: EM | Admit: 2023-08-26 | Discharge: 2023-08-29 | DRG: 057 | Disposition: A | Discharge status: Skilled Nursing Facility | Source: Skilled Nursing Facility | Attending: Family Medicine | Admitting: Family Medicine

## 2023-08-26 ENCOUNTER — Other Ambulatory Visit: Payer: Self-pay

## 2023-08-26 ENCOUNTER — Emergency Department (HOSPITAL_COMMUNITY)

## 2023-08-26 ENCOUNTER — Encounter (HOSPITAL_COMMUNITY): Payer: Self-pay | Admitting: Emergency Medicine

## 2023-08-26 ENCOUNTER — Encounter: Payer: Self-pay | Admitting: Adult Health

## 2023-08-26 DIAGNOSIS — S40812A Abrasion of left upper arm, initial encounter: Secondary | ICD-10-CM | POA: Diagnosis present

## 2023-08-26 DIAGNOSIS — E119 Type 2 diabetes mellitus without complications: Secondary | ICD-10-CM | POA: Diagnosis present

## 2023-08-26 DIAGNOSIS — R339 Retention of urine, unspecified: Secondary | ICD-10-CM | POA: Diagnosis not present

## 2023-08-26 DIAGNOSIS — K219 Gastro-esophageal reflux disease without esophagitis: Secondary | ICD-10-CM | POA: Diagnosis present

## 2023-08-26 DIAGNOSIS — Z751 Person awaiting admission to adequate facility elsewhere: Secondary | ICD-10-CM

## 2023-08-26 DIAGNOSIS — S2249XA Multiple fractures of ribs, unspecified side, initial encounter for closed fracture: Secondary | ICD-10-CM | POA: Diagnosis present

## 2023-08-26 DIAGNOSIS — E871 Hypo-osmolality and hyponatremia: Secondary | ICD-10-CM | POA: Insufficient documentation

## 2023-08-26 DIAGNOSIS — Z880 Allergy status to penicillin: Secondary | ICD-10-CM

## 2023-08-26 DIAGNOSIS — S2241XS Multiple fractures of ribs, right side, sequela: Secondary | ICD-10-CM | POA: Diagnosis not present

## 2023-08-26 DIAGNOSIS — S0091XA Abrasion of unspecified part of head, initial encounter: Secondary | ICD-10-CM | POA: Diagnosis present

## 2023-08-26 DIAGNOSIS — E1165 Type 2 diabetes mellitus with hyperglycemia: Secondary | ICD-10-CM | POA: Diagnosis not present

## 2023-08-26 DIAGNOSIS — G20A1 Parkinson's disease without dyskinesia, without mention of fluctuations: Principal | ICD-10-CM | POA: Diagnosis present

## 2023-08-26 DIAGNOSIS — Y92129 Unspecified place in nursing home as the place of occurrence of the external cause: Secondary | ICD-10-CM

## 2023-08-26 DIAGNOSIS — I700 Atherosclerosis of aorta: Secondary | ICD-10-CM

## 2023-08-26 DIAGNOSIS — Z886 Allergy status to analgesic agent status: Secondary | ICD-10-CM

## 2023-08-26 DIAGNOSIS — I10 Essential (primary) hypertension: Secondary | ICD-10-CM | POA: Diagnosis present

## 2023-08-26 DIAGNOSIS — E1159 Type 2 diabetes mellitus with other circulatory complications: Secondary | ICD-10-CM

## 2023-08-26 DIAGNOSIS — H40059 Ocular hypertension, unspecified eye: Secondary | ICD-10-CM | POA: Diagnosis present

## 2023-08-26 DIAGNOSIS — Z8673 Personal history of transient ischemic attack (TIA), and cerebral infarction without residual deficits: Secondary | ICD-10-CM

## 2023-08-26 DIAGNOSIS — W19XXXA Unspecified fall, initial encounter: Secondary | ICD-10-CM | POA: Diagnosis present

## 2023-08-26 DIAGNOSIS — Z791 Long term (current) use of non-steroidal anti-inflammatories (NSAID): Secondary | ICD-10-CM

## 2023-08-26 DIAGNOSIS — I152 Hypertension secondary to endocrine disorders: Secondary | ICD-10-CM

## 2023-08-26 DIAGNOSIS — N179 Acute kidney failure, unspecified: Principal | ICD-10-CM | POA: Diagnosis present

## 2023-08-26 DIAGNOSIS — D72829 Elevated white blood cell count, unspecified: Secondary | ICD-10-CM | POA: Diagnosis present

## 2023-08-26 DIAGNOSIS — S2241XD Multiple fractures of ribs, right side, subsequent encounter for fracture with routine healing: Secondary | ICD-10-CM

## 2023-08-26 DIAGNOSIS — H409 Unspecified glaucoma: Secondary | ICD-10-CM

## 2023-08-26 DIAGNOSIS — E861 Hypovolemia: Secondary | ICD-10-CM | POA: Diagnosis present

## 2023-08-26 DIAGNOSIS — E785 Hyperlipidemia, unspecified: Secondary | ICD-10-CM | POA: Diagnosis present

## 2023-08-26 DIAGNOSIS — R296 Repeated falls: Secondary | ICD-10-CM | POA: Diagnosis present

## 2023-08-26 DIAGNOSIS — N138 Other obstructive and reflux uropathy: Secondary | ICD-10-CM | POA: Diagnosis present

## 2023-08-26 DIAGNOSIS — K5900 Constipation, unspecified: Secondary | ICD-10-CM | POA: Diagnosis present

## 2023-08-26 DIAGNOSIS — Z8669 Personal history of other diseases of the nervous system and sense organs: Secondary | ICD-10-CM

## 2023-08-26 DIAGNOSIS — Z794 Long term (current) use of insulin: Secondary | ICD-10-CM

## 2023-08-26 DIAGNOSIS — Z8249 Family history of ischemic heart disease and other diseases of the circulatory system: Secondary | ICD-10-CM

## 2023-08-26 DIAGNOSIS — Z79899 Other long term (current) drug therapy: Secondary | ICD-10-CM

## 2023-08-26 DIAGNOSIS — E1169 Type 2 diabetes mellitus with other specified complication: Secondary | ICD-10-CM

## 2023-08-26 DIAGNOSIS — N401 Enlarged prostate with lower urinary tract symptoms: Secondary | ICD-10-CM | POA: Diagnosis present

## 2023-08-26 DIAGNOSIS — Z9181 History of falling: Secondary | ICD-10-CM

## 2023-08-26 DIAGNOSIS — F02A2 Dementia in other diseases classified elsewhere, mild, with psychotic disturbance: Secondary | ICD-10-CM

## 2023-08-26 DIAGNOSIS — R338 Other retention of urine: Secondary | ICD-10-CM | POA: Insufficient documentation

## 2023-08-26 DIAGNOSIS — Z800 Family history of malignant neoplasm of digestive organs: Secondary | ICD-10-CM

## 2023-08-26 DIAGNOSIS — E876 Hypokalemia: Secondary | ICD-10-CM | POA: Insufficient documentation

## 2023-08-26 DIAGNOSIS — E538 Deficiency of other specified B group vitamins: Secondary | ICD-10-CM

## 2023-08-26 MED ORDER — OXYCODONE-ACETAMINOPHEN 5-325 MG PO TABS
1.0000 | ORAL_TABLET | Freq: Once | ORAL | Status: DC
  Filled 2023-08-26: qty 1

## 2023-08-26 NOTE — ED Notes (Signed)
 Patient transported to CT

## 2023-08-26 NOTE — ED Provider Notes (Signed)
 [place] EMERGENCY DEPARTMENT AT [facility] Provider Note   CSN: [id number] Arrival date & time: 08/26/23  2239     History {Add pertinent medical, surgical, social history, OB history to HPI:1} Chief Complaint  Patient presents with   Mark Brady is a 89 y.o. male.   Fall Associated symptoms include chest pain.  Patient presents after fall.  Medical history includes HTN, HLD, DM, CVA, arthritis, GERD, Parkinson's disease.  Per chart review, he is not on a blood thinner.  Patient had a prior fall and sustained some rib injuries.  He has since been living at the [facility].  Today, at facility, he had an unwitnessed fall.  He was noted to have an abrasion to left side of head.  He endorses pain in bilateral shoulders and chest.  He denies any other areas of pain.      Home Medications Prior to Admission medications   Medication Sig Start Date End Date Taking? Authorizing Provider  Accu-Chek Softclix Lancets lancets USE TO CHECK BLOOD SUGAR DAILY 01/07/23   Cook, Jayce G, DO  acetaminophen  (TYLENOL ) 500 MG tablet Take 2 tablets (1,000 mg total) by mouth every 8 (eight) hours as needed for mild pain (pain score 1-3) or headache (or Fever >/= 101). 08/23/23   Justina Oman, MD  amLODipine  (NORVASC ) 10 MG tablet Take 1 tablet (10 mg total) by mouth daily. 07/29/23   Cook, Jayce G, DO  carbidopa -levodopa  (SINEMET  IR) 25-100 MG tablet Take 2 tablets by mouth 3 (three) times daily. 7am/11am/4pm 07/09/23   Tat, Von Grumbling, DO  cyanocobalamin  2000 MCG tablet Take 2,000 mcg by mouth daily.    [provider]  dorzolamide -timolol  (COSOPT ) 22.3-6.8 MG/ML ophthalmic solution Place 1 drop into both eyes daily.    [provider]  glucose blood (ACCU-CHEK AVIVA PLUS) test strip USE TO CHECK BLOOD SUGAR DAILY 05/24/22   Cook, Jayce G, DO  latanoprost  (XALATAN ) 0.005 % ophthalmic solution SMARTSIG:1 Drop(s) In Eye(s) Every Evening 04/30/22   [provider]  lidocaine  (LIDODERM ) 5 % Place 1 patch onto the skin daily. Remove & Discard patch within 12 hours; apply to affected area (right sided costal zone) 08/24/23   Justina Oman, MD  losartan  (COZAAR ) 100 MG tablet TAKE 1 TABLET(100 MG) BY MOUTH DAILY 06/26/23   Cook, Jayce G, DO  meloxicam (MOBIC) 7.5 MG tablet Take 7.5 mg by mouth daily.    [provider]  omeprazole  (PRILOSEC) 40 MG capsule Take 40 mg by mouth daily.    [provider]  oxyCODONE  (OXY IR/ROXICODONE ) 5 MG immediate release tablet Take 1 tablet (5 mg total) by mouth every 6 (six) hours as needed for severe pain (pain score 7-10). 08/23/23   Justina Oman, MD  pravastatin  (PRAVACHOL ) 40 MG tablet TAKE 1 TABLET(40 MG) BY MOUTH DAILY 06/26/23   Cook, Jayce G, DO  PREVIDENT 5000 BOOSTER PLUS 1.1 % PSTE  07/03/23   [provider]      Allergies    Penicillins and Aspirin     Review of Systems   Review of Systems  Cardiovascular:  Positive for chest pain.  Musculoskeletal:  Positive for arthralgias.  All other systems reviewed and are negative.   Physical Exam Updated Vital Signs BP 103/84   Pulse 86   Temp 98 F (36.7 C) (Axillary)   Resp 16   Ht 5\' 6"  (1.676 m)   Wt 77 kg   SpO2 90%  BMI 27.40 kg/m  Physical Exam Vitals and nursing note reviewed.  Constitutional:      General: He is not in acute distress.    Appearance: Normal appearance. He is well-developed. He is not ill-appearing, toxic-appearing or diaphoretic.  HENT:     Head: Normocephalic and atraumatic.     Right Ear: External ear normal.     Left Ear: External ear normal.     Nose: Nose normal.     Mouth/Throat:     Mouth: Mucous membranes are moist.  Eyes:     Extraocular Movements: Extraocular movements intact.     Conjunctiva/sclera: Conjunctivae normal.  Cardiovascular:     Rate and Rhythm: Normal rate and regular rhythm.  Pulmonary:     Effort: Pulmonary effort is normal. No respiratory distress.     Breath  sounds: No wheezing or rales.  Chest:     Chest wall: No tenderness.  Abdominal:     General: There is distension.     Palpations: Abdomen is soft.     Tenderness: There is no abdominal tenderness.  Musculoskeletal:        General: No deformity. Normal range of motion.     Cervical back: Normal range of motion and neck supple.     Right lower leg: Edema present.     Left lower leg: Edema present.  Skin:    General: Skin is warm and dry.     Coloration: Skin is not jaundiced or pale.  Neurological:     General: No focal deficit present.     Mental Status: He is alert. Mental status is at baseline.  Psychiatric:        Mood and Affect: Mood normal.        Behavior: Behavior normal.     ED Results / Procedures / Treatments   Labs (all labs ordered are listed, but only abnormal results are displayed) Labs Reviewed - No data to display  EKG None  Radiology No results found.  Procedures Procedures  {Document cardiac monitor, telemetry assessment procedure when appropriate:1}  Medications Ordered in ED Medications - No data to display  ED Course/ Medical Decision Making/ A&P   {   Click here for ABCD2, HEART and other calculatorsREFRESH Note before signing :1}                              Medical Decision Making  This patient presents to the ED for concern of ***, this involves an extensive number of treatment options, and is a complaint that carries with it a high risk of complications and morbidity.  The differential diagnosis includes ***   Co morbidities that complicate the patient evaluation  ***   Additional history obtained:  Additional history obtained from *** External records from outside source obtained and reviewed including ***   Lab Tests:  I Ordered, and personally interpreted labs.  The pertinent results include:  ***   Imaging Studies ordered:  I ordered imaging studies including ***  I independently visualized and interpreted imaging  which showed *** I agree with the radiologist interpretation   Cardiac Monitoring: / EKG:  The patient was maintained on a cardiac monitor.  I personally viewed and interpreted the cardiac monitored which showed an underlying rhythm of: ***   Problem List / ED Course / Critical interventions / Medication management  *** I ordered medication including ***  for ***  Reevaluation of the patient after  these medicines showed that the patient {resolved/improved/worsened:23923::"improved"} I have reviewed the patients home medicines and have made adjustments as needed   Consultations Obtained:  I requested consultation with the ***,  and discussed lab and imaging findings as well as pertinent plan - they recommend: ***   Social Determinants of Health:  ***   Test / Admission - Considered:  ***   {Document critical care time when appropriate:1} {Document review of labs and clinical decision tools ie heart score, Chads2Vasc2 etc:1}  {Document your independent review of radiology images, and any outside records:1} {Document your discussion with family members, caretakers, and with consultants:1} {Document social determinants of health affecting pt's care:1} {Document your decision making why or why not admission, treatments were needed:1} Final Clinical Impression(s) / ED Diagnoses Final diagnoses:  None    Rx / DC Orders ED Discharge Orders     None

## 2023-08-26 NOTE — ED Triage Notes (Signed)
 Pt had unwitnessed fall and has abrasion to arm and head. Nurse states his pupils are not reactive.

## 2023-08-26 NOTE — Progress Notes (Signed)
 Location:  [facility] Room Number: 128 Place of Service:  SNF (31)   CODE STATUS: full   Allergies  Allergen Reactions   Penicillins    Aspirin  Rash    Chief Complaint  Patient presents with   Hospitalization Follow-up    HPI:  He is a 89 year old man who has been hospitalized from 08-21-23 through 08-23-23. His past medical history includes: parkinson's disease; GERD; hypertension; hyperlipidemia; type 2 diabetes mellitus. He presented to the ED after a mechanical fall with right right cage pain. He tripped and fell on his way to the bathroom and ended up hitting the side of his bed.  He was found to have 10,11,12 right rib cage fractures. He is here for short term rehab with his goal to return back home with family. He will continue to be followed for his chronic illnesses including: Type 2 diabetes mellitus with hyperglycemia without long term current use of insulin :  Hypertension associated with type 2 diabetes mellitus:  Hyperlipidemia associated with type 2 diabetes mellitus:    Past Medical History:  Diagnosis Date   Arthritis    Diabetes mellitus without complication (HCC)    diet controlled   History of elevated PSA    Hyperlipidemia    Hypertension    Stroke [facility])     Past Surgical History:  Procedure Laterality Date   CATARACT EXTRACTION W/PHACO Right 01/26/2014   Procedure: CATARACT EXTRACTION PHACO AND INTRAOCULAR LENS PLACEMENT (IOC);  Surgeon: Lavonia Powers T. Gennie Kicks, MD;  Location: [facility] ORS;  Service: Ophthalmology;  Laterality: Right;  CDE 6.53   CATARACT EXTRACTION W/PHACO Left 02/09/2014   Procedure: CATARACT EXTRACTION PHACO AND INTRAOCULAR LENS PLACEMENT (IOC);  Surgeon: Lavonia Powers T. Gennie Kicks, MD;  Location: [facility] ORS;  Service: Ophthalmology;  Laterality: Left;  CDE:5.58   COLONOSCOPY     YAG LASER APPLICATION Right 12/07/2014   Procedure: YAG LASER APPLICATION;  Surgeon: Albert Huff, MD;  Location: [facility] ORS;  Service: Ophthalmology;  Laterality: Right;     Social History   Socioeconomic History   Marital status: Married    Spouse name: Stana Ear   Number of children: 5   Years of education: Not on file   Highest education level: Not on file  Occupational History   Occupation: retired    Comment: tobacco and beef farmer  Tobacco Use   Smoking status: Never   Smokeless tobacco: Never  Substance and Sexual Activity   Alcohol use: No   Drug use: No   Sexual activity: Yes    Birth control/protection: None  Other Topics Concern   Not on file  Social History Narrative   3children, all live nearby.   Grandchildren and great grandchildren.      Right Handed    Social Drivers of Health   Financial Resource Strain: Low Risk  (05/17/2023)   Overall Financial Resource Strain (CARDIA)    Difficulty of Paying Living Expenses: Not hard at all  Food Insecurity: No Food Insecurity (08/21/2023)   Hunger Vital Sign    Worried About Running Out of Food in the Last Year: Never true    Ran Out of Food in the Last Year: Never true  Transportation Needs: No Transportation Needs (08/21/2023)   PRAPARE - Administrator, Civil Service (Medical): No    Lack of Transportation (Non-Medical): No  Physical Activity: Insufficiently Active (05/17/2023)   Exercise Vital Sign    Days of Exercise per Week: 3 days    Minutes  of Exercise per Session: 30 min  Stress: No Stress Concern Present (05/17/2023)   [employer] of Occupational Health - Occupational Stress Questionnaire    Feeling of Stress : Not at all  Social Connections: Moderately Integrated (08/21/2023)   Social Connection and Isolation Panel [NHANES]    Frequency of Communication with Friends and Family: More than three times a week    Frequency of Social Gatherings with Friends and Family: More than three times a week    Attends Religious Services: More than 4 times per year    Active Member of [employer] or Organizations: No    Attends Banker Meetings: Never    Marital  Status: Married  Catering manager Violence: Not At Risk (08/21/2023)   Humiliation, Afraid, Rape, and Kick questionnaire    Fear of Current or Ex-Partner: No    Emotionally Abused: No    Physically Abused: No    Sexually Abused: No   Family History  Problem Relation Age of Onset   Arthritis Other    Colon cancer Child    Heart failure Child       VITAL SIGNS BP (!) 136/56   Pulse (!) 55   Temp 97.8 F (36.6 C)   Resp 20   Ht 5\' 6"  (1.676 m)   Wt 169 lb 12.8 oz (77 kg)   SpO2 99%   BMI 27.41 kg/m   Outpatient Encounter Medications as of 08/26/2023  Medication Sig   Accu-Chek Softclix Lancets lancets USE TO CHECK BLOOD SUGAR DAILY   acetaminophen  (TYLENOL ) 500 MG tablet Take 2 tablets (1,000 mg total) by mouth every 8 (eight) hours as needed for mild pain (pain score 1-3) or headache (or Fever >/= 101).   amLODipine  (NORVASC ) 10 MG tablet Take 1 tablet (10 mg total) by mouth daily.   carbidopa -levodopa  (SINEMET  IR) 25-100 MG tablet Take 2 tablets by mouth 3 (three) times daily. 7am/11am/4pm   cyanocobalamin  (VITAMIN B12) 1000 MCG tablet Take 1 tablet (1,000 mcg total) by mouth daily.   dorzolamide -timolol  (COSOPT ) 22.3-6.8 MG/ML ophthalmic solution Place 1 drop into both eyes daily.   glucose blood (ACCU-CHEK AVIVA PLUS) test strip USE TO CHECK BLOOD SUGAR DAILY   latanoprost  (XALATAN ) 0.005 % ophthalmic solution SMARTSIG:1 Drop(s) In Eye(s) Every Evening   lidocaine  (LIDODERM ) 5 % Place 1 patch onto the skin daily. Remove & Discard patch within 12 hours; apply to affected area (right sided costal zone)   losartan  (COZAAR ) 100 MG tablet TAKE 1 TABLET(100 MG) BY MOUTH DAILY   oxyCODONE  (OXY IR/ROXICODONE ) 5 MG immediate release tablet Take 1 tablet (5 mg total) by mouth every 6 (six) hours as needed for severe pain (pain score 7-10).   pantoprazole  (PROTONIX ) 40 MG tablet TAKE 1 TABLET(40 MG) BY MOUTH DAILY (Patient not taking: Reported on 08/21/2023)   pravastatin  (PRAVACHOL ) 40  MG tablet TAKE 1 TABLET(40 MG) BY MOUTH DAILY   PREVIDENT 5000 BOOSTER PLUS 1.1 % PSTE    No facility-administered encounter medications on file as of 08/26/2023.     SIGNIFICANT DIAGNOSTIC EXAMS  TODAY  07-25-23: hgb A1c 6.7; chol 173; ldl 91; trig 99; hdl 64 08-21-23: wbc 12.8; hgb 14.1; hct 40.8; mcv 96.5 plt 371; glucose 140; bun 22; creat 0.91; k+ 4.0; na++ 136; ca 9.2 gfr >60; protein 9.2 albumin 7.5; tsh 4.136; vitamin B 12: 196  Review of Systems  Constitutional:  Negative for malaise/fatigue.  Respiratory:  Negative for cough and shortness of breath.   Cardiovascular:  Negative for chest pain, palpitations and leg swelling.  Gastrointestinal:  Negative for abdominal pain, constipation and heartburn.  Musculoskeletal:  Positive for joint pain and myalgias. Negative for back pain.       Right rib cage pain Bilateral shoulder pain   Skin:  Positive for rash.  Neurological:  Negative for dizziness.  Psychiatric/Behavioral:  The patient is not nervous/anxious.    Physical Exam Constitutional:      General: He is not in acute distress.    Appearance: He is well-developed. He is not diaphoretic.  Neck:     Thyroid : No thyromegaly.  Cardiovascular:     Rate and Rhythm: Normal rate and regular rhythm.     Pulses: Normal pulses.     Heart sounds: Normal heart sounds.  Pulmonary:     Effort: Pulmonary effort is normal. No respiratory distress.     Breath sounds: Normal breath sounds.  Abdominal:     General: Bowel sounds are normal. There is no distension.     Palpations: Abdomen is soft.     Tenderness: There is no abdominal tenderness.  Musculoskeletal:        General: Normal range of motion.     Cervical back: Neck supple.     Right lower leg: No edema.     Left lower leg: No edema.     Comments: Has right rib cage pain bilateral shoulder pain   Lymphadenopathy:     Cervical: No cervical adenopathy.  Skin:    General: Skin is warm and dry.     Comments: Red blotchy  raised rash on back   Neurological:     Mental Status: He is alert. Mental status is at baseline.  Psychiatric:        Mood and Affect: Mood normal.       ASSESSMENT/ PLAN:  TODAY  Closed fracture of multiple ribs on right side sequela: will continue lidoderm  patch to area daily; will continue oxycodone  5 mg every 6 hours as needed; has tylenol  1 gm every 8 hours as needed; will begin mobic 7.5 mg daily for one month  2. Type 2 diabetes mellitus with hyperglycemia without long term current use of insulin : hgb A1c 6.7; is diet controlled is on statin and arb  3. Hypertension associated with type 2 diabetes mellitus: b/p 136/56: will continue norvasc  10 mg daily and losartan  100 mg daily   4. Hyperlipidemia associated with type 2 diabetes mellitus: ldl 91; will continue pravachol  40 mg daily   5. Parkinson's disease unspecified whether dyskinesia present; unspecified whether  manifestations fluctuate: will continue sinemet  25/100 2 tabs three times daily   6. 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   7. Glaucoma of both eyes unspecified type: will continue cosopt  2-0.5 mcg to both eyes daily and latanoprost  to both eyes nightly   8. Gastroesophageal reflux disease without esophagitis: will continue prilosec 40 mg daily   9.  Vitamin B 12 deficiency: level 196 will increase to 2,000 mcg daily   10. Aortic atherosclerosis: (ct 08-21-23) is on statin   11. Has leukocytosis will repeat cbc with diff; there are no indications of infection present.    Britt Candle NP [facility]  call [phone]

## 2023-08-26 NOTE — ED Notes (Signed)
 Bed alarm pad placed under pt and alarm turned on.

## 2023-08-27 DIAGNOSIS — N138 Other obstructive and reflux uropathy: Secondary | ICD-10-CM | POA: Diagnosis present

## 2023-08-27 DIAGNOSIS — E785 Hyperlipidemia, unspecified: Secondary | ICD-10-CM | POA: Diagnosis present

## 2023-08-27 DIAGNOSIS — R339 Retention of urine, unspecified: Secondary | ICD-10-CM | POA: Diagnosis present

## 2023-08-27 DIAGNOSIS — G20A1 Parkinson's disease without dyskinesia, without mention of fluctuations: Secondary | ICD-10-CM | POA: Diagnosis present

## 2023-08-27 DIAGNOSIS — D72829 Elevated white blood cell count, unspecified: Secondary | ICD-10-CM | POA: Diagnosis present

## 2023-08-27 DIAGNOSIS — Z79899 Other long term (current) drug therapy: Secondary | ICD-10-CM | POA: Diagnosis not present

## 2023-08-27 DIAGNOSIS — W19XXXA Unspecified fall, initial encounter: Secondary | ICD-10-CM | POA: Diagnosis present

## 2023-08-27 DIAGNOSIS — N401 Enlarged prostate with lower urinary tract symptoms: Secondary | ICD-10-CM | POA: Diagnosis present

## 2023-08-27 DIAGNOSIS — S40812A Abrasion of left upper arm, initial encounter: Secondary | ICD-10-CM | POA: Diagnosis present

## 2023-08-27 DIAGNOSIS — I10 Essential (primary) hypertension: Secondary | ICD-10-CM | POA: Diagnosis present

## 2023-08-27 DIAGNOSIS — E119 Type 2 diabetes mellitus without complications: Secondary | ICD-10-CM | POA: Diagnosis present

## 2023-08-27 DIAGNOSIS — Z794 Long term (current) use of insulin: Secondary | ICD-10-CM | POA: Diagnosis not present

## 2023-08-27 DIAGNOSIS — K5900 Constipation, unspecified: Secondary | ICD-10-CM | POA: Diagnosis present

## 2023-08-27 DIAGNOSIS — H40059 Ocular hypertension, unspecified eye: Secondary | ICD-10-CM | POA: Diagnosis present

## 2023-08-27 DIAGNOSIS — S0091XA Abrasion of unspecified part of head, initial encounter: Secondary | ICD-10-CM | POA: Diagnosis present

## 2023-08-27 DIAGNOSIS — Z800 Family history of malignant neoplasm of digestive organs: Secondary | ICD-10-CM | POA: Diagnosis not present

## 2023-08-27 DIAGNOSIS — N179 Acute kidney failure, unspecified: Secondary | ICD-10-CM | POA: Diagnosis present

## 2023-08-27 DIAGNOSIS — S2241XD Multiple fractures of ribs, right side, subsequent encounter for fracture with routine healing: Secondary | ICD-10-CM | POA: Diagnosis not present

## 2023-08-27 DIAGNOSIS — S2241XS Multiple fractures of ribs, right side, sequela: Secondary | ICD-10-CM | POA: Diagnosis not present

## 2023-08-27 DIAGNOSIS — K219 Gastro-esophageal reflux disease without esophagitis: Secondary | ICD-10-CM | POA: Diagnosis present

## 2023-08-27 DIAGNOSIS — Z8249 Family history of ischemic heart disease and other diseases of the circulatory system: Secondary | ICD-10-CM | POA: Diagnosis not present

## 2023-08-27 DIAGNOSIS — R296 Repeated falls: Secondary | ICD-10-CM | POA: Diagnosis present

## 2023-08-27 DIAGNOSIS — Z8673 Personal history of transient ischemic attack (TIA), and cerebral infarction without residual deficits: Secondary | ICD-10-CM | POA: Diagnosis not present

## 2023-08-27 DIAGNOSIS — E861 Hypovolemia: Secondary | ICD-10-CM | POA: Diagnosis present

## 2023-08-27 DIAGNOSIS — E871 Hypo-osmolality and hyponatremia: Secondary | ICD-10-CM | POA: Diagnosis present

## 2023-08-27 DIAGNOSIS — R338 Other retention of urine: Secondary | ICD-10-CM | POA: Diagnosis not present

## 2023-08-27 DIAGNOSIS — Z791 Long term (current) use of non-steroidal anti-inflammatories (NSAID): Secondary | ICD-10-CM | POA: Diagnosis not present

## 2023-08-27 DIAGNOSIS — Y92129 Unspecified place in nursing home as the place of occurrence of the external cause: Secondary | ICD-10-CM | POA: Diagnosis not present

## 2023-08-27 DIAGNOSIS — E876 Hypokalemia: Secondary | ICD-10-CM | POA: Diagnosis present

## 2023-08-27 LAB — CBC
HCT: 35.2 % — ABNORMAL LOW (ref 39.0–52.0)
Hemoglobin: 11.9 g/dL — ABNORMAL LOW (ref 13.0–17.0)
MCH: 32.1 pg (ref 26.0–34.0)
MCHC: 33.8 g/dL (ref 30.0–36.0)
MCV: 94.9 fL (ref 80.0–100.0)
Platelets: 244 10*3/uL (ref 150–400)
RBC: 3.71 MIL/uL — ABNORMAL LOW (ref 4.22–5.81)
RDW: 12.8 % (ref 11.5–15.5)
WBC: 14.1 10*3/uL — ABNORMAL HIGH (ref 4.0–10.5)
nRBC: 0 % (ref 0.0–0.2)

## 2023-08-27 LAB — URINALYSIS, ROUTINE W REFLEX MICROSCOPIC
Bacteria, UA: NONE SEEN
Bilirubin Urine: NEGATIVE
Glucose, UA: NEGATIVE mg/dL
Ketones, ur: 5 mg/dL — AB
Nitrite: NEGATIVE
Protein, ur: NEGATIVE mg/dL
Specific Gravity, Urine: 1.015 (ref 1.005–1.030)
pH: 5 (ref 5.0–8.0)

## 2023-08-27 LAB — CBG MONITORING, ED: Glucose-Capillary: 145 mg/dL — ABNORMAL HIGH (ref 70–99)

## 2023-08-27 LAB — GLUCOSE, CAPILLARY
Glucose-Capillary: 114 mg/dL — ABNORMAL HIGH (ref 70–99)
Glucose-Capillary: 127 mg/dL — ABNORMAL HIGH (ref 70–99)

## 2023-08-27 LAB — COMPREHENSIVE METABOLIC PANEL WITH GFR
ALT: 7 U/L (ref 0–44)
AST: 22 U/L (ref 15–41)
Albumin: 3 g/dL — ABNORMAL LOW (ref 3.5–5.0)
Alkaline Phosphatase: 59 U/L (ref 38–126)
Anion gap: 10 (ref 5–15)
BUN: 68 mg/dL — ABNORMAL HIGH (ref 8–23)
CO2: 21 mmol/L — ABNORMAL LOW (ref 22–32)
Calcium: 7.9 mg/dL — ABNORMAL LOW (ref 8.9–10.3)
Chloride: 100 mmol/L (ref 98–111)
Creatinine, Ser: 2.12 mg/dL — ABNORMAL HIGH (ref 0.61–1.24)
GFR, Estimated: 29 mL/min — ABNORMAL LOW (ref >60)
Glucose, Bld: 152 mg/dL — ABNORMAL HIGH (ref 70–99)
Potassium: 3.8 mmol/L (ref 3.5–5.1)
Sodium: 131 mmol/L — ABNORMAL LOW (ref 135–145)
Total Bilirubin: 0.9 mg/dL (ref 0.0–1.2)
Total Protein: 6.8 g/dL (ref 6.5–8.1)

## 2023-08-27 LAB — MAGNESIUM: Magnesium: 2.3 mg/dL (ref 1.7–2.4)

## 2023-08-27 LAB — TROPONIN I (HIGH SENSITIVITY)
Troponin I (High Sensitivity): 6 ng/L (ref <18)
Troponin I (High Sensitivity): 6 ng/L (ref <18)

## 2023-08-27 LAB — MRSA NEXT GEN BY PCR, NASAL: MRSA by PCR Next Gen: NOT DETECTED

## 2023-08-27 MED ORDER — DORZOLAMIDE HCL-TIMOLOL MAL 2-0.5 % OP SOLN
1.0000 [drp] | Freq: Every day | OPHTHALMIC | Status: DC
  Administered 2023-08-28 – 2023-08-29 (×2): 1 [drp] via OPHTHALMIC
  Filled 2023-08-27: qty 10

## 2023-08-27 MED ORDER — INSULIN ASPART 100 UNIT/ML IJ SOLN: 0.0000 [IU] | Freq: Every day | INTRAMUSCULAR | Status: DC

## 2023-08-27 MED ORDER — LACTATED RINGERS IV BOLUS
1000.0000 mL | Freq: Once | INTRAVENOUS | Status: AC
  Administered 2023-08-27: 1000 mL via INTRAVENOUS

## 2023-08-27 MED ORDER — LACTATED RINGERS IV SOLN: INTRAVENOUS | Status: AC

## 2023-08-27 MED ORDER — HALOPERIDOL LACTATE 5 MG/ML IJ SOLN
2.0000 mg | Freq: Four times a day (QID) | INTRAMUSCULAR | Status: DC | PRN
  Administered 2023-08-28: 2 mg via INTRAVENOUS
  Filled 2023-08-27: qty 1

## 2023-08-27 MED ORDER — PRAVASTATIN SODIUM 40 MG PO TABS
40.0000 mg | ORAL_TABLET | Freq: Every day | ORAL | Status: DC
  Administered 2023-08-27 – 2023-08-29 (×3): 40 mg via ORAL
  Filled 2023-08-27 (×3): qty 1

## 2023-08-27 MED ORDER — LIDOCAINE 5 % EX PTCH
1.0000 | MEDICATED_PATCH | CUTANEOUS | Status: DC
  Administered 2023-08-27 – 2023-08-29 (×3): 1 via TRANSDERMAL
  Filled 2023-08-27 (×3): qty 1

## 2023-08-27 MED ORDER — OXYCODONE HCL 5 MG PO TABS
5.0000 mg | ORAL_TABLET | ORAL | Status: DC | PRN
  Administered 2023-08-27: 5 mg via ORAL
  Filled 2023-08-27: qty 1

## 2023-08-27 MED ORDER — LATANOPROST 0.005 % OP SOLN
1.0000 [drp] | Freq: Every day | OPHTHALMIC | Status: DC
  Administered 2023-08-27 – 2023-08-28 (×2): 1 [drp] via OPHTHALMIC
  Filled 2023-08-27: qty 2.5

## 2023-08-27 MED ORDER — ACETAMINOPHEN 325 MG PO TABS: 650.0000 mg | ORAL_TABLET | Freq: Four times a day (QID) | ORAL | Status: DC | PRN

## 2023-08-27 MED ORDER — CARBIDOPA-LEVODOPA 25-100 MG PO TABS
2.0000 | ORAL_TABLET | Freq: Three times a day (TID) | ORAL | Status: DC
  Administered 2023-08-27 – 2023-08-29 (×7): 2 via ORAL
  Filled 2023-08-27 (×7): qty 2

## 2023-08-27 MED ORDER — MELATONIN 3 MG PO TABS
3.0000 mg | ORAL_TABLET | Freq: Every day | ORAL | Status: DC
  Administered 2023-08-27: 3 mg via ORAL
  Filled 2023-08-27: qty 1

## 2023-08-27 MED ORDER — ONDANSETRON HCL 4 MG/2ML IJ SOLN: 4.0000 mg | Freq: Four times a day (QID) | INTRAMUSCULAR | Status: DC | PRN

## 2023-08-27 MED ORDER — AMLODIPINE BESYLATE 5 MG PO TABS
10.0000 mg | ORAL_TABLET | Freq: Every day | ORAL | Status: DC
  Administered 2023-08-27 – 2023-08-29 (×3): 10 mg via ORAL
  Filled 2023-08-27 (×3): qty 2

## 2023-08-27 MED ORDER — ONDANSETRON HCL 4 MG PO TABS
4.0000 mg | ORAL_TABLET | Freq: Four times a day (QID) | ORAL | Status: DC | PRN
Start: 2023-08-27 — End: 2023-08-29

## 2023-08-27 MED ORDER — TAMSULOSIN HCL 0.4 MG PO CAPS
0.4000 mg | ORAL_CAPSULE | Freq: Every day | ORAL | Status: DC
  Administered 2023-08-27 – 2023-08-29 (×3): 0.4 mg via ORAL
  Filled 2023-08-27 (×3): qty 1

## 2023-08-27 MED ORDER — ORAL CARE MOUTH RINSE: 15.0000 mL | OROMUCOSAL | Status: DC | PRN

## 2023-08-27 MED ORDER — POLYETHYLENE GLYCOL 3350 17 G PO PACK
17.0000 g | PACK | Freq: Every day | ORAL | Status: DC
  Administered 2023-08-27 – 2023-08-28 (×2): 17 g via ORAL
  Filled 2023-08-27 (×3): qty 1

## 2023-08-27 MED ORDER — ACETAMINOPHEN 650 MG RE SUPP: 650.0000 mg | Freq: Four times a day (QID) | RECTAL | Status: DC | PRN

## 2023-08-27 MED ORDER — LOSARTAN POTASSIUM 50 MG PO TABS
100.0000 mg | ORAL_TABLET | Freq: Every day | ORAL | Status: DC
  Administered 2023-08-27: 100 mg via ORAL
  Filled 2023-08-27: qty 4

## 2023-08-27 MED ORDER — METHOCARBAMOL 1000 MG/10ML IJ SOLN
500.0000 mg | Freq: Four times a day (QID) | INTRAMUSCULAR | Status: DC | PRN
Start: 2023-08-27 — End: 2023-08-29

## 2023-08-27 MED ORDER — INSULIN ASPART 100 UNIT/ML IJ SOLN
0.0000 [IU] | Freq: Three times a day (TID) | INTRAMUSCULAR | Status: DC
Start: 2023-08-27 — End: 2023-08-29
  Administered 2023-08-27 – 2023-08-28 (×2): 1 [IU] via SUBCUTANEOUS
  Administered 2023-08-29: 2 [IU] via SUBCUTANEOUS
  Filled 2023-08-27: qty 1

## 2023-08-27 MED ORDER — LACTATED RINGERS IV SOLN: INTRAVENOUS | Status: DC

## 2023-08-27 MED ORDER — SENNOSIDES-DOCUSATE SODIUM 8.6-50 MG PO TABS
1.0000 | ORAL_TABLET | Freq: Two times a day (BID) | ORAL | Status: DC
  Administered 2023-08-27 – 2023-08-28 (×3): 1 via ORAL
  Filled 2023-08-27 (×4): qty 1

## 2023-08-27 MED ORDER — ENOXAPARIN SODIUM 30 MG/0.3ML IJ SOSY
30.0000 mg | PREFILLED_SYRINGE | INTRAMUSCULAR | Status: DC
  Administered 2023-08-27 – 2023-08-28 (×2): 30 mg via SUBCUTANEOUS
  Filled 2023-08-27 (×2): qty 0.3

## 2023-08-27 MED ORDER — PANTOPRAZOLE SODIUM 40 MG PO TBEC
40.0000 mg | DELAYED_RELEASE_TABLET | Freq: Every day | ORAL | Status: DC
  Administered 2023-08-27 – 2023-08-29 (×3): 40 mg via ORAL
  Filled 2023-08-27 (×3): qty 1

## 2023-08-27 NOTE — TOC Initial Note (Signed)
 Transition of Care [facility]) - Initial/Assessment Note    Patient Details  Name: Mark Brady MRN: [id number] Date of Birth: Jul 07, 1929  [facility] CM/SW Contact:    Orelia Binet, RN Phone Number: 08/27/2023, 1:55 PM  Clinical Narrative:     Patient readmitted with Acute kidney injury, recently went to [facility]. CM checked with Maudine Sos, [employer] is requesting PT eval and Auth for patient to return. MD updated.          Expected Discharge Plan: Skilled Nursing Facility Barriers to Discharge: Continued Medical Work up   Patient Goals and CMS Choice       Activities of Daily Living   ADL Screening (condition at time of admission) Independently performs ADLs?: No Does the patient have a NEW difficulty with bathing/dressing/toileting/self-feeding that is expected to last >3 days?: Yes (Initiates electronic notice to provider for possible OT consult) Does the patient have a NEW difficulty with getting in/out of bed, walking, or climbing stairs that is expected to last >3 days?: No Does the patient have a NEW difficulty with communication that is expected to last >3 days?: No Is the patient deaf or have difficulty hearing?: No Does the patient have difficulty seeing, even when wearing glasses/contacts?: No Does the patient have difficulty concentrating, remembering, or making decisions?: No  Permission Sought/Granted       Admission diagnosis:  Urinary retention [R33.9] AKI (acute kidney injury) (HCC) [N17.9] Patient Active Problem List   Diagnosis Date Noted   AKI (acute kidney injury) (HCC) 08/27/2023   Acute urinary retention 08/27/2023   Hyponatremia 08/27/2023   Hypokalemia 08/27/2023   Hypertension associated with type 2 diabetes mellitus (HCC) 08/26/2023   Hyperlipidemia associated with type 2 diabetes mellitus (HCC) 08/26/2023   Vitamin B 12 deficiency 08/26/2023   Aortic atherosclerosis (HCC) 08/26/2023   History of Parkinson's disease 08/23/2023   Rib  fractures 08/21/2023   Parkinson's disease dementia (HCC) 04/23/2023   Parkinson's disease (HCC) 07/24/2021   Sialorrhea 05/14/2020   Gastroesophageal reflux disease without esophagitis 05/13/2020   Glaucoma 11/23/2019   Diabetes (HCC) 10/19/2012   Essential hypertension, benign 09/28/2012   Hyperlipidemia 09/28/2012   PCP:  Myrna Ast, DO Pharmacy:   [facility] [phone] - [place], [place] - [address] AT SEC OF [address] & [address] [place] [address] Phone: [phone] Fax: [phone]     Social Drivers of Health (SDOH) Social History: SDOH Screenings   Food Insecurity: No Food Insecurity (08/27/2023)  Housing: Low Risk  (08/27/2023)  Transportation Needs: No Transportation Needs (08/27/2023)  Utilities: Not At Risk (08/27/2023)  Alcohol Screen: Low Risk  (05/17/2023)  Depression (PHQ2-9): Low Risk  (05/29/2023)  Financial Resource Strain: Low Risk  (05/17/2023)  Physical Activity: Insufficiently Active (05/17/2023)  Social Connections: Moderately Integrated (08/27/2023)  Stress: No Stress Concern Present (05/17/2023)  Tobacco Use: Low Risk  (08/26/2023)  Health Literacy: Patient Declined (05/17/2023)   SDOH Interventions:    Readmission Risk Interventions     No data to display

## 2023-08-27 NOTE — Plan of Care (Signed)
  Problem: Acute Rehab PT Goals(only PT should resolve) Goal: Pt Will Go Supine/Side To Sit Outcome: Progressing Flowsheets (Taken 08/27/2023 1501) Pt will go Supine/Side to Sit: with minimal assist Goal: Patient Will Transfer Sit To/From Stand Outcome: Progressing Flowsheets (Taken 08/27/2023 1501) Patient will transfer sit to/from stand: with minimal assist Goal: Pt Will Transfer Bed To Chair/Chair To Bed Outcome: Progressing Flowsheets (Taken 08/27/2023 1501) Pt will Transfer Bed to Chair/Chair to Bed: with min assist Goal: Pt Will Ambulate Outcome: Progressing Flowsheets (Taken 08/27/2023 1501) Pt will Ambulate:  25 feet  with minimal assist   Armond Bertin, PT, DPT [facility] Office: [phone] 3:02 PM, 08/27/23

## 2023-08-27 NOTE — Evaluation (Signed)
 Occupational Therapy Evaluation Patient Details Name: Mark Brady MRN: [id number] DOB: 01/22/1930 Today's Date: 08/27/2023   History of Present Illness   Associated symptoms include chest pain.   Patient presents after fall.  Medical history includes HTN, HLD, DM, CVA, arthritis, GERD, Parkinson's disease.  Per chart review, he is not on a blood thinner.  Patient had a prior fall and sustained some rib injuries.  He has since been living at the [facility].  Today, at facility, he had an unwitnessed fall.  He was noted to have an abrasion to left side of head.  He endorses pain in bilateral shoulders and chest.  He denies any other areas of pain. (per MD)     Clinical Impressions Pt agreeable to OT and PT co-evaluation. Prior to recent SNF admission the pt was able to ambulate using SPC mostly and independent with ADL's. Pt has not been mobile since being at [facility] per wife's report. Pt required max A for supine to sit bed mobility with poor sitting balance. Pt required mod to max A for step pivot transfer to chair with RW. B UE appeared weak but difficult to fully assess given pt's confusion and some difficulty following commands. Pt requires much assist for ADL's at this time. Pt left in the chair with chair alarm set and NT staff present. Pt will benefit from continued OT in the hospital and recommended venue below to increase strength, balance, and endurance for safe ADL's.        If plan is discharge home, recommend the following:   A lot of help with walking and/or transfers;A lot of help with bathing/dressing/bathroom;Assistance with cooking/housework;Assistance with feeding;Assist for transportation;Help with stairs or ramp for entrance;Direct supervision/assist for medications management     Functional Status Assessment   Patient has had a recent decline in their functional status and demonstrates the ability to make significant improvements in function in a reasonable and  predictable amount of time.     Equipment Recommendations   None recommended by OT             Precautions/Restrictions   Precautions Precautions: Fall Recall of Precautions/Restrictions: Impaired Restrictions Weight Bearing Restrictions Per Provider Order: No     Mobility Bed Mobility Overal bed mobility: Needs Assistance Bed Mobility: Supine to Sit     Supine to sit: Max assist     General bed mobility comments: labored movement; much physical assist; pt lethargic    Transfers Overall transfer level: Needs assistance Equipment used: Rolling walker (2 wheels) Transfers: Sit to/from Stand, Bed to chair/wheelchair/BSC Sit to Stand: Mod assist, Max assist     Step pivot transfers: Mod assist, Max assist     General transfer comment: Unsteady; labored movement with extended time; assist to guide RW for steps to chair from EOB.      Balance Overall balance assessment: Needs assistance Sitting-balance support: Bilateral upper extremity supported, Feet supported Sitting balance-Leahy Scale: Poor Sitting balance - Comments: seated at EOB Postural control: Posterior lean Standing balance support: Bilateral upper extremity supported, During functional activity, Reliant on assistive device for balance Standing balance-Leahy Scale: Poor Standing balance comment: using RW                           ADL either performed or assessed with clinical judgement   ADL Overall ADL's : Needs assistance/impaired Eating/Feeding: Moderate assistance;Maximal assistance;Sitting   Grooming: Maximal assistance;Sitting   Upper Body Bathing: Moderate assistance;Maximal assistance;Sitting  Lower Body Bathing: Maximal assistance   Upper Body Dressing : Moderate assistance;Maximal assistance;Sitting   Lower Body Dressing: Maximal assistance;Total assistance;Sitting/lateral leans   Toilet Transfer: Moderate assistance;Maximal assistance;Rolling walker (2  wheels);Stand-pivot Statistician Details (indicate cue type and reason): Simulated via EOB to chair transfer. Toileting- Clothing Manipulation and Hygiene: Maximal assistance;Total assistance;Bed level               Vision Baseline Vision/History: 3 Glaucoma Ability to See in Adequate Light: 2 Moderately impaired Patient Visual Report: Other (comment) (no obvious changes or reports) Vision Assessment?:  (vision impaired at baseline)     Perception Perception: Not tested       Praxis Praxis: Not tested       Pertinent Vitals/Pain Pain Assessment Pain Assessment: Faces Faces Pain Scale: No hurt     Extremity/Trunk Assessment Upper Extremity Assessment Upper Extremity Assessment: Difficult to assess due to impaired cognition (Near full P/ROM for shoulder flexion to L side. Mild limitation to R side but above 75% of available range. Able to grasp RW functionally.)   Lower Extremity Assessment Lower Extremity Assessment: Defer to PT evaluation   Cervical / Trunk Assessment Cervical / Trunk Assessment: Kyphotic   Communication Communication Communication: Other (comment);Impaired (Verbal at times; mumbling and difficult to unserstand some.) Factors Affecting Communication: Reduced clarity of speech   Cognition Arousal: Alert Behavior During Therapy: WFL for tasks assessed/performed, Impulsive Cognition: Cognition impaired, History of cognitive impairments             OT - Cognition Comments: Pt lethargic and intermittently verbal. Pt in mittens to start the session.                 Following commands: Impaired Following commands impaired: Follows one step commands with increased time and inconsistently.      Cueing  General Comments   Cueing Techniques: Verbal cues;Tactile cues                 Home Living Family/patient expects to be discharged to:: Skilled nursing facility                                        Prior  Functioning/Environment Prior Level of Function : Needs assist             Mobility Comments: household and short distanced community ambulation with cane mostly, does not drive; this was prior to SNF where he has not been mobile per family's report. ADLs Comments: Independent household, assisted for community ADLs by family; this was prior to SNF where he has likely had much assist for ADL's.    OT Problem List: Decreased strength;Decreased range of motion;Decreased activity tolerance;Impaired balance (sitting and/or standing);Impaired vision/perception;Decreased cognition;Decreased safety awareness;Decreased knowledge of use of DME or AE   OT Treatment/Interventions: Self-care/ADL training;Therapeutic exercise;Therapeutic activities;Patient/family education;Balance training;DME and/or AE instruction;Cognitive remediation/compensation      OT Goals(Current goals can be found in the care plan section)   Acute Rehab OT Goals Patient Stated Goal: return to SNF OT Goal Formulation: With family Time For Goal Achievement: 09/10/23 Potential to Achieve Goals: Good   OT Frequency:  Min 2X/week    Co-evaluation PT/OT/SLP Co-Evaluation/Treatment: Yes Reason for Co-Treatment: To address functional/ADL transfers   OT goals addressed during session: ADL's and self-care      AM-PAC OT "6 Clicks" Daily Activity     Outcome Measure Help from another person  eating meals?: A Lot Help from another person taking care of personal grooming?: A Lot Help from another person toileting, which includes using toliet, bedpan, or urinal?: A Lot Help from another person bathing (including washing, rinsing, drying)?: A Lot Help from another person to put on and taking off regular upper body clothing?: A Lot Help from another person to put on and taking off regular lower body clothing?: A Lot 6 Click Score: 12   End of Session Equipment Utilized During Treatment: Rolling walker (2 wheels);Gait  belt  Activity Tolerance: Patient tolerated treatment well Patient left: in chair;with call bell/phone within reach;with chair alarm set;with family/visitor present;with nursing/sitter in room  OT Visit Diagnosis: Unsteadiness on feet (R26.81);Other abnormalities of gait and mobility (R26.89);Muscle weakness (generalized) (M62.81);History of falling (Z91.81);Other symptoms and signs involving cognitive function                Time: 1351-1413 OT Time Calculation (min): 22 min Charges:  OT General Charges $OT Visit: 1 Visit OT Evaluation $OT Eval Low Complexity: 1 Low  Priscille Shadduck OT, MOT   Thurnell Floss 08/27/2023, 2:30 PM

## 2023-08-27 NOTE — Plan of Care (Signed)
  Problem: Acute Rehab OT Goals (only OT should resolve) Goal: Pt. Will Perform Eating Flowsheets (Taken 08/27/2023 1434) Pt Will Perform Eating: with modified independence Goal: Pt. Will Perform Grooming Flowsheets (Taken 08/27/2023 1434) Pt Will Perform Grooming: with set-up Goal: Pt. Will Perform Upper Body Dressing Flowsheets (Taken 08/27/2023 1434) Pt Will Perform Upper Body Dressing:  with set-up  with contact guard assist Goal: Pt. Will Perform Lower Body Dressing Flowsheets (Taken 08/27/2023 1434) Pt Will Perform Lower Body Dressing:  with mod assist  sitting/lateral leans  with min assist Goal: Pt. Will Transfer To Toilet Flowsheets (Taken 08/27/2023 1434) Pt Will Transfer to Toilet:  with min assist  stand pivot transfer Goal: Pt. Will Perform Toileting-Clothing Manipulation Flowsheets (Taken 08/27/2023 1434) Pt Will Perform Toileting - Clothing Manipulation and hygiene:  with mod assist  sitting/lateral leans Goal: Pt/Caregiver Will Perform Home Exercise Program Flowsheets (Taken 08/27/2023 1434) Pt/caregiver will Perform Home Exercise Program:  Increased strength  Increased ROM  With minimal assist  Both right and left upper extremity  Jakaleb Payer OT, MOT

## 2023-08-27 NOTE — H&P (Signed)
 History and Physical    PatientMARITZA Brady WUJ:[id number] DOB: 1929-11-23 DOA: 08/26/2023 DOS: the patient was seen and examined on 08/27/2023 PCP: Cook, Jayce G, DO  Patient coming from: SNF  Chief Complaint:  Chief Complaint  Patient presents with   Fall   HPI: Mark Brady is a 89 y.o. male with medical history significant of  HTN, HLD, DM, CVA, arthritis, type II diabetes mellitus, GERD, Parkinson's disease, and increased intraocular pressure.  He was recently admitted 08/21/23-08/23/23 after a fall where he was noted to have (R) sided rib fractures. Today he presents to any pain ED after another unwitnessed fall.  He was noted to have an abrasion on the left side of his head, not on any blood thinners.  In the ED he endorsed bilateral shoulder and chest pain.  He is otherwise a poor historian, able to answer yes/no questions with questionable reliability of answers.  ED Course: On arrival to [facility] ED patient was noted to be afebrile temp 36.7 C, BP 103/84, HR 80, RR 16, SpO2 90% on room air.  CT Head and C-spine negative for acute abnormalities, CT Chest with stable (R) rib fractures and new atelectasis and small effusion, CT abd/pelvis with bilateral hydronephrosis and hydroureter with distended bladder, plain film of bilateral shoulders negative for acute abnormalities.  Labs notable for mild hyponatremia sodium 131, CO2 21, glucose 152, BUN 68, creatinine 2.12, WBC 14.1, hemoglobin 11.9, troponin negative x 2.  He received Percocet, 1 L LR bolus, and continuous LR. [facility] contacted for admission.  Review of Systems: As mentioned in the history of present illness. All other systems reviewed and are negative. Past Medical History:  Diagnosis Date   Arthritis    Diabetes mellitus without complication (HCC)    diet controlled   History of elevated PSA    Hyperlipidemia    Hypertension    Stroke [facility])    Past Surgical History:  Procedure Laterality Date   CATARACT  EXTRACTION W/PHACO Right 01/26/2014   Procedure: CATARACT EXTRACTION PHACO AND INTRAOCULAR LENS PLACEMENT (IOC);  Surgeon: Lavonia Powers T. Gennie Kicks, MD;  Location: [facility];  Service: Ophthalmology;  Laterality: Right;  CDE 6.53   CATARACT EXTRACTION W/PHACO Left 02/09/2014   Procedure: CATARACT EXTRACTION PHACO AND INTRAOCULAR LENS PLACEMENT (IOC);  Surgeon: Lavonia Powers T. Gennie Kicks, MD;  Location: [facility];  Service: Ophthalmology;  Laterality: Left;  CDE:5.58   COLONOSCOPY     YAG LASER APPLICATION Right 12/07/2014   Procedure: YAG LASER APPLICATION;  Surgeon: Albert Huff, MD;  Location: [facility];  Service: Ophthalmology;  Laterality: Right;   Social History:  reports that he has never smoked. He has never used smokeless tobacco. He reports that he does not drink alcohol and does not use drugs.  Allergies  Allergen Reactions   Penicillins    Aspirin  Rash    Family History  Problem Relation Age of Onset   Arthritis Other    Colon cancer Child    Heart failure Child     Prior to Admission medications   Medication Sig Start Date End Date Taking? Authorizing Provider  Accu-Chek Softclix Lancets lancets USE TO CHECK BLOOD SUGAR DAILY 01/07/23   Cook, Jayce G, DO  acetaminophen  (TYLENOL ) 500 MG tablet Take 2 tablets (1,000 mg total) by mouth every 8 (eight) hours as needed for mild pain (pain score 1-3) or headache (or Fever >/= 101). 08/23/23   Justina Oman, MD  amLODipine  (NORVASC ) 10 MG tablet Take 1 tablet (10 mg  total) by mouth daily. 07/29/23   Cook, Jayce G, DO  carbidopa -levodopa  (SINEMET  IR) 25-100 MG tablet Take 2 tablets by mouth 3 (three) times daily. 7am/11am/4pm 07/09/23   Tat, Von Grumbling, DO  cyanocobalamin  2000 MCG tablet Take 2,000 mcg by mouth daily.    [provider]  dorzolamide -timolol  (COSOPT ) 22.3-6.8 MG/ML ophthalmic solution Place 1 drop into both eyes daily.    [provider]  glucose blood (ACCU-CHEK AVIVA PLUS) test strip USE TO CHECK BLOOD SUGAR DAILY 05/24/22   Cook,  Jayce G, DO  latanoprost  (XALATAN ) 0.005 % ophthalmic solution SMARTSIG:1 Drop(s) In Eye(s) Every Evening 04/30/22   [provider]  lidocaine  (LIDODERM ) 5 % Place 1 patch onto the skin daily. Remove & Discard patch within 12 hours; apply to affected area (right sided costal zone) 08/24/23   Justina Oman, MD  losartan  (COZAAR ) 100 MG tablet TAKE 1 TABLET(100 MG) BY MOUTH DAILY 06/26/23   Cook, Jayce G, DO  meloxicam (MOBIC) 7.5 MG tablet Take 7.5 mg by mouth daily.    [provider]  omeprazole  (PRILOSEC) 40 MG capsule Take 40 mg by mouth daily.    [provider]  oxyCODONE  (OXY IR/ROXICODONE ) 5 MG immediate release tablet Take 1 tablet (5 mg total) by mouth every 6 (six) hours as needed for severe pain (pain score 7-10). 08/23/23   Justina Oman, MD  pravastatin  (PRAVACHOL ) 40 MG tablet TAKE 1 TABLET(40 MG) BY MOUTH DAILY 06/26/23   Cook, Jayce G, DO  PREVIDENT 5000 BOOSTER PLUS 1.1 % PSTE  07/03/23   [provider]    Physical Exam: Vitals:   08/27/23 0215 08/27/23 0430 08/27/23 0446 08/27/23 0730  BP: 135/70 125/74 (!) 125/96 (!) 102/56  Pulse:  93 90 98  Resp: 16 18 18 17   Temp:    98.2 F (36.8 C)  TempSrc:    Oral  SpO2: 98% 91% 93% 94%  Weight:      Height:       Constitutional: NAD, calm, comfortable Eyes: PERRL, lids and conjunctivae normal ENMT: Mucous membranes are moist. Posterior pharynx clear of any exudate or lesions.Normal dentition.  Neck: normal, supple, no masses, no thyromegaly Respiratory: clear to auscultation bilaterally, no wheezing, no crackles. Normal respiratory effort. No accessory muscle use.  Cardiovascular: Regular rate and rhythm, no murmurs / rubs / gallops. No extremity edema. 2+ pedal pulses. No carotid bruits.  Abdomen: Distended. Mild generalized tenderness, no masses palpated. No hepatosplenomegaly. Bowel sounds positive x4 quadrants.  Musculoskeletal: no clubbing / cyanosis. No joint deformity upper and lower  extremities.  Skin: Abrasion on Warm, dry. No rashes, lesions, ulcers.  Neurologic: CN 2-12 grossly intact.  Alert and oriented x person. Normal mood.   Data Reviewed: CBC    Component Value Date/Time   WBC 14.1 (H) 08/27/2023 0009   RBC 3.71 (L) 08/27/2023 0009   HGB 11.9 (L) 08/27/2023 0009   HGB 14.5 07/25/2022 1044   HCT 35.2 (L) 08/27/2023 0009   HCT 43.0 07/25/2022 1044   PLT 244 08/27/2023 0009   PLT 317 07/25/2022 1044   MCV 94.9 08/27/2023 0009   MCV 95 07/25/2022 1044   MCH 32.1 08/27/2023 0009   MCHC 33.8 08/27/2023 0009   RDW 12.8 08/27/2023 0009   RDW 12.7 07/25/2022 1044   LYMPHSABS 1.4 08/21/2023 0737   MONOABS 1.0 08/21/2023 0737   EOSABS 0.2 08/21/2023 0737   BASOSABS 0.0 08/21/2023 0737   CMP     Component Value Date/Time  NA 131 (L) 08/27/2023 0009   NA 141 07/25/2022 1044   K 3.8 08/27/2023 0009   CL 100 08/27/2023 0009   CO2 21 (L) 08/27/2023 0009   GLUCOSE 152 (H) 08/27/2023 0009   BUN 68 (H) 08/27/2023 0009   BUN 18 07/25/2022 1044   CREATININE 2.12 (H) 08/27/2023 0009   CREATININE 1.15 09/21/2013 0719   CALCIUM 7.9 (L) 08/27/2023 0009   PROT 6.8 08/27/2023 0009   PROT 6.9 07/25/2022 1044   ALBUMIN 3.0 (L) 08/27/2023 0009   ALBUMIN 4.5 07/25/2022 1044   AST 22 08/27/2023 0009   ALT 7 08/27/2023 0009   ALKPHOS 59 08/27/2023 0009   BILITOT 0.9 08/27/2023 0009   BILITOT 0.3 07/25/2022 1044   EGFR 67 07/25/2022 1044   GFRNONAA 29 (L) 08/27/2023 0009   Magnesium    Component Value Date/Time   ETH <10 10/29/2022 1534   Cardiac Panel (last 3 results) Recent Labs    08/27/23 0009 08/27/23 0155  TROPONINIHS 6 6   Urinalysis    Component Value Date/Time   COLORURINE YELLOW 08/26/2023 2352   APPEARANCEUR HAZY (A) 08/26/2023 2352   LABSPEC 1.015 08/26/2023 2352   PHURINE 5.0 08/26/2023 2352   GLUCOSEU NEGATIVE 08/26/2023 2352   HGBUR MODERATE (A) 08/26/2023 2352   BILIRUBINUR NEGATIVE 08/26/2023 2352   KETONESUR 5 (A) 08/26/2023  2352   PROTEINUR NEGATIVE 08/26/2023 2352   UROBILINOGEN 0.2 09/05/2014 2130   NITRITE NEGATIVE 08/26/2023 2352   LEUKOCYTESUR TRACE (A) 08/26/2023 2352    DG Shoulder Left Port Result Date: 08/27/2023 CLINICAL DATA:  Recent fall with left shoulder pain, initial encounter EXAM: LEFT SHOULDER COMPARISON:  None Available. FINDINGS: Degenerative changes of the acromioclavicular joint are seen. No acute fracture or dislocation is noted. No soft tissue changes are seen. IMPRESSION: Degenerative change without acute abnormality. Electronically Signed   By: Violeta Grey M.D.   On: 08/27/2023 00:05   DG Shoulder Right Port Result Date: 08/27/2023 CLINICAL DATA:  Recent fall with right shoulder pain, initial encounter EXAM: RIGHT SHOULDER - 1 VIEW COMPARISON:  None Available. FINDINGS: Degenerative changes of the acromioclavicular joint are seen. Remodeling of the acromion is noted with a high-riding humeral head consistent with chronic rotator cuff injury. Underlying bony thorax appears within normal limits. No other focal abnormality is noted. IMPRESSION: Chronic changes without acute abnormality. Electronically Signed   By: Violeta Grey M.D.   On: 08/27/2023 00:05   CT CHEST ABDOMEN PELVIS WO CONTRAST Result Date: 08/27/2023 CLINICAL DATA:  Recent fall EXAM: CT CHEST, ABDOMEN AND PELVIS WITHOUT CONTRAST TECHNIQUE: Multidetector CT imaging of the chest, abdomen and pelvis was performed following the standard protocol without IV contrast. RADIATION DOSE REDUCTION: This exam was performed according to the departmental dose-optimization program which includes automated exposure control, adjustment of the mA and/or kV according to patient size and/or use of iterative reconstruction technique. COMPARISON:  08/21/2023 FINDINGS: CT CHEST FINDINGS Cardiovascular: Limited due to lack of IV contrast. Atherosclerotic calcifications are noted. No aneurysmal dilatation is seen. Coronary calcifications are noted. Heart is  not significantly enlarged in size. Mediastinum/Nodes: Thoracic inlet is within normal limits. No hilar or mediastinal adenopathy is noted. The esophagus as visualized is within normal limits. Lungs/Pleura: Basilar atelectasis is noted increased on the right when compared with the prior exam with a small right-sided pleural effusion. No sizable parenchymal nodule is seen. Stable less than 5 mm nodule is noted in the left upper lobe best seen on image number 47 of  series 4. no follow-up is recommended. Musculoskeletal: Fractures of the right posterior eighth through eleventh ribs are again seen without pneumothorax. The atelectasis and new effusion is likely reactive in nature. CT ABDOMEN PELVIS FINDINGS Hepatobiliary: No focal liver abnormality is seen. No gallstones, gallbladder wall thickening, or biliary dilatation. Pancreas: Unremarkable. No pancreatic ductal dilatation or surrounding inflammatory changes. Spleen: Normal in size without focal abnormality. Adrenals/Urinary Tract: Adrenal glands are within normal limits. Kidneys are well visualized bilaterally with hydronephrosis and hydroureter new from the prior exam. This is felt to be related to over distention of the bladder as no calculi are seen. No renal calculi are noted. The bladder is well distended. Stomach/Bowel: No obstructive or inflammatory changes of colon are seen. Fecal material is noted throughout the colon. The appendix is not well visualized although no inflammatory changes to suggest appendicitis are noted. Small bowel and stomach are unremarkable. Vascular/Lymphatic: Aortic atherosclerosis. No enlarged abdominal or pelvic lymph nodes. Reproductive: Prostate is enlarged. Other: No abdominal wall hernia or abnormality. No abdominopelvic ascites. Musculoskeletal: There are degenerative changes of lumbar spine are noted. IMPRESSION: CT of the chest: Stable appearing right-sided ribs with new reactive atelectasis and small effusion. CT of the  abdomen and pelvis: Bilateral hydronephrosis and hydroureter secondary to an over distended bladder. No stones are seen. Electronically Signed   By: Violeta Grey M.D.   On: 08/27/2023 00:03   CT HEAD WO CONTRAST Result Date: 08/26/2023 CLINICAL DATA:  Recent fall with headaches and neck pain, initial encounter EXAM: CT HEAD WITHOUT CONTRAST CT CERVICAL SPINE WITHOUT CONTRAST TECHNIQUE: Multidetector CT imaging of the head and cervical spine was performed following the standard protocol without intravenous contrast. Multiplanar CT image reconstructions of the cervical spine were also generated. RADIATION DOSE REDUCTION: This exam was performed according to the departmental dose-optimization program which includes automated exposure control, adjustment of the mA and/or kV according to patient size and/or use of iterative reconstruction technique. COMPARISON:  08/21/2023 FINDINGS: CT HEAD FINDINGS Brain: No evidence of acute infarction, hemorrhage, hydrocephalus, extra-axial collection or mass lesion/mass effect. Chronic atrophic and ischemic changes are again seen and stable. Vascular: No hyperdense vessel or unexpected calcification. Skull: Normal. Negative for fracture or focal lesion. Sinuses/Orbits: Orbits and their contents are within normal limits. Some sclerotic changes are noted about the sphenoid sinus on the left with opacification similar to that seen on wall. Other: None CT CERVICAL SPINE FINDINGS Alignment: Loss of the normal cervical lordosis. Skull base and vertebrae: 7 cervical segments are well visualized. Vertebral body height is well maintained. Minimal anterolisthesis of C4 on C5 of a degenerative nature is noted. No acute fracture or acute facet abnormality is noted. Degenerative facet hypertrophic changes are seen. Soft tissues and spinal canal: Surrounding soft tissue structures are within normal limits. Upper chest: Visualized lung apices are unremarkable. Other: None IMPRESSION: CT of the  head: Chronic changes as described stable from the recent exam. CT of the cervical spine: Multilevel degenerative change without acute abnormality. Electronically Signed   By: Violeta Grey M.D.   On: 08/26/2023 23:50   CT CERVICAL SPINE WO CONTRAST Result Date: 08/26/2023 CLINICAL DATA:  Recent fall with headaches and neck pain, initial encounter EXAM: CT HEAD WITHOUT CONTRAST CT CERVICAL SPINE WITHOUT CONTRAST TECHNIQUE: Multidetector CT imaging of the head and cervical spine was performed following the standard protocol without intravenous contrast. Multiplanar CT image reconstructions of the cervical spine were also generated. RADIATION DOSE REDUCTION: This exam was performed according to the departmental  dose-optimization program which includes automated exposure control, adjustment of the mA and/or kV according to patient size and/or use of iterative reconstruction technique. COMPARISON:  08/21/2023 FINDINGS: CT HEAD FINDINGS Brain: No evidence of acute infarction, hemorrhage, hydrocephalus, extra-axial collection or mass lesion/mass effect. Chronic atrophic and ischemic changes are again seen and stable. Vascular: No hyperdense vessel or unexpected calcification. Skull: Normal. Negative for fracture or focal lesion. Sinuses/Orbits: Orbits and their contents are within normal limits. Some sclerotic changes are noted about the sphenoid sinus on the left with opacification similar to that seen on wall. Other: None CT CERVICAL SPINE FINDINGS Alignment: Loss of the normal cervical lordosis. Skull base and vertebrae: 7 cervical segments are well visualized. Vertebral body height is well maintained. Minimal anterolisthesis of C4 on C5 of a degenerative nature is noted. No acute fracture or acute facet abnormality is noted. Degenerative facet hypertrophic changes are seen. Soft tissues and spinal canal: Surrounding soft tissue structures are within normal limits. Upper chest: Visualized lung apices are  unremarkable. Other: None IMPRESSION: CT of the head: Chronic changes as described stable from the recent exam. CT of the cervical spine: Multilevel degenerative change without acute abnormality. Electronically Signed   By: Violeta Grey M.D.   On: 08/26/2023 23:50     Assessment and Plan: ##Urinary Retention ##Acute Kidney Injury - Foley placed in ED, continue - IVF hydration - Avoid nephrotoxins, Contrast Dyes, Hypotension and Dehydration  - Renally Adjust Meds - Continue to Monitor and Trend Renal Function carefully and repeat BMP with AM labs    ##Constipation - Scheduled bowel regimen with Miralax and Senokot-S  ## Hyponatremia Mild, hypovolemic - IVF hydration  #Previous (R) Rib Fractures -PRN analgesia - Incentive Spirometer  #Hypertension -Continue home losartan  and amlodipine   #Type II Diabetes Mellitus - Not on any outpatient medications, normally diet controlled. CBG 152 on BMP this AM - AC/HS CBG monitoring and SSI  #GERD - Continue home protonix   #Hyperlipidemia - Continue home Pravachol   #Parkinson's Disease - Continue home Sinemet    VTE prophylaxis: Lovenox   GI prophylaxis: Protonix  Diet: Heart Healthy Access: PIV Lines: Foley Code Status: FULL HCDM: Wife, Mark Brady Telemetry: No Disposition: Observe on Med-Surg Patient is from: [facility]  Anticipated d/c is to: [facility]  Anticipated d/c date is: 08/28/23 Patient currently: Receiving IV fluids, pending improvement in Creatinine   Advance Care Planning:   Code Status: Full Code   Consults: N/A  Family Communication: Wife Called, no answer, HIPAA compliant voicemail left. Son Gwinda Leopard updated via phone.  Severity of Illness: The appropriate patient status for this patient is INPATIENT. Inpatient status is judged to be reasonable and necessary in order to provide the required intensity of service to ensure the patient's safety. The patient's presenting symptoms, physical exam findings, and  initial radiographic and laboratory data in the context of their chronic comorbidities is felt to place them at high risk for further clinical deterioration. Furthermore, it is not anticipated that the patient will be medically stable for discharge from the hospital within 2 midnights of admission.   * I certify that at the point of admission it is my clinical judgment that the patient will require inpatient hospital care spanning beyond 2 midnights from the point of admission due to high intensity of service, high risk for further deterioration and high frequency of surveillance required.*  To reach the provider On-Call:   7AM- 7PM see care teams to locate the attending and reach out to them via [email or web address]. Password: [email or web address]  7PM-7AM contact night-coverage If you still have difficulty reaching the appropriate provider, please page the [facility] (Director on Call) for [facility] on amion for assistance  This document was prepared using Sales executive software and may include unintentional dictation errors.  Naida Austria FNP-BC, PMHNP-BC Nurse Practitioner [facility]

## 2023-08-27 NOTE — Evaluation (Signed)
 Physical Therapy Evaluation Patient Details Name: Mark Brady MRN: [id number] DOB: 1929-07-05 Today's Date: 08/27/2023  History of Present Illness  Associated symptoms include chest pain.   Patient presents after fall.  Medical history includes HTN, HLD, DM, CVA, arthritis, GERD, Parkinson's disease.  Per chart review, he is not on a blood thinner.  Patient had a prior fall and sustained some rib injuries.  He has since been living at the [facility].  Today, at facility, he had an unwitnessed fall.  He was noted to have an abrasion to left side of head.  He endorses pain in bilateral shoulders and chest.  He denies any other areas of pain.   Clinical Impression  Patient demonstrates need for mod/max assist for bed mobility, functional transfers, and initiation of ambulation. Pt demonstrates decreased LE strength, inconsistent command following, and impaired balance. Patient also demonstrates difficulty with ambulation during today's session with shuffle pattern utilized only after tactile cue and multiple verbal cues, decreased stride length and velocity noted. Patient also demonstrates need for mittens at this time due to confusion and restless hands. Patient would benefit from skilled acute physical therapy for increased independence with ambulation, increased LE strength, and balance for improved gait quality, return to higher level of function with ADLs, and progress towards therapy goals.         If plan is discharge home, recommend the following: A lot of help with bathing/dressing/bathroom;A lot of help with walking and/or transfers;Help with stairs or ramp for entrance;Assistance with cooking/housework   Can travel by private vehicle   No    Equipment Recommendations None recommended by PT  Recommendations for Other Services       Functional Status Assessment Patient has had a recent decline in their functional status and demonstrates the ability to make significant  improvements in function in a reasonable and predictable amount of time.     Precautions / Restrictions Precautions Precautions: Fall Recall of Precautions/Restrictions: Impaired Restrictions Weight Bearing Restrictions Per Provider Order: No      Mobility  Bed Mobility Overal bed mobility: Needs Assistance Bed Mobility: Supine to Sit     Supine to sit: Max assist Sit to supine: Mod assist   General bed mobility comments: labored movement; much physical assist; pt lethargic    Transfers Overall transfer level: Needs assistance Equipment used: Rolling walker (2 wheels) Transfers: Sit to/from Stand, Bed to chair/wheelchair/BSC Sit to Stand: Mod assist, Max assist   Step pivot transfers: Mod assist, Max assist       General transfer comment: Unsteady; labored movement with extended time; assist to guide RW for steps to chair from EOB.    Ambulation/Gait Ambulation/Gait assistance: Mod assist Gait Distance (Feet): 4 Feet Assistive device: Rolling walker (2 wheels) Gait Pattern/deviations: Decreased step length - right, Decreased step length - left, Decreased stride length, Shuffle Gait velocity: slow     General Gait Details: limited to taking steps in room with slow labored unsteady movement using RW, limited mostly due to right sided rib cage pain and fatigue  Stairs            Wheelchair Mobility     Tilt Bed    Modified Rankin (Stroke Patients Only)       Balance Overall balance assessment: Needs assistance Sitting-balance support: Bilateral upper extremity supported, Feet supported Sitting balance-Leahy Scale: Poor Sitting balance - Comments: seated at EOB Postural control: Posterior lean Standing balance support: Bilateral upper extremity supported, During functional activity, Reliant on assistive  device for balance Standing balance-Leahy Scale: Poor Standing balance comment: using RW                             Pertinent  Vitals/Pain Pain Assessment Pain Assessment: Faces Faces Pain Scale: No hurt    Home Living Family/patient expects to be discharged to:: Skilled nursing facility Living Arrangements: Spouse/significant other Available Help at Discharge: Family;Available PRN/intermittently Type of Home: House Home Access: Stairs to enter Entrance Stairs-Rails: Right;Left;Can reach both Entrance Stairs-Number of Steps: 7   Home Layout: One level Home Equipment: Agricultural consultant (2 wheels);Cane - single point      Prior Function Prior Level of Function : Needs assist       Physical Assist : Mobility (physical);ADLs (physical) Mobility (physical): Bed mobility;Transfers;Gait;Stairs   Mobility Comments: household and short distanced community ambulation with cane mostly, does not drive; this was prior to SNF where he has not been mobile per family's report. ADLs Comments: Independent household, assisted for community ADLs by family; this was prior to SNF where he has likely had much assist for ADL's.     Extremity/Trunk Assessment   Upper Extremity Assessment Upper Extremity Assessment: Defer to OT evaluation    Lower Extremity Assessment Lower Extremity Assessment: Difficult to assess due to impaired cognition;Generalized weakness    Cervical / Trunk Assessment Cervical / Trunk Assessment: Kyphotic  Communication   Communication Communication: Other (comment);Impaired Factors Affecting Communication: Reduced clarity of speech    Cognition Arousal: Alert, Stuporous Behavior During Therapy: WFL for tasks assessed/performed, Impulsive   PT - Cognitive impairments: Safety/Judgement                       PT - Cognition Comments: requireds repeated verbal, tactile cueing for safety Following commands: Impaired Following commands impaired: Follows one step commands with increased time, Follows one step commands inconsistently     Cueing Cueing Techniques: Verbal cues, Tactile  cues     General Comments      Exercises     Assessment/Plan    PT Assessment Patient needs continued PT services  PT Problem List Decreased strength;Decreased activity tolerance;Decreased balance;Decreased mobility;Decreased safety awareness       PT Treatment Interventions DME instruction;Gait training;Stair training;Functional mobility training;Therapeutic activities;Therapeutic exercise;Balance training;Patient/family education    PT Goals (Current goals can be found in the Care Plan section)  Acute Rehab PT Goals Patient Stated Goal: return home after rehab PT Goal Formulation: With patient/family Time For Goal Achievement: 09/10/23 Potential to Achieve Goals: Good    Frequency Min 3X/week     Co-evaluation PT/OT/SLP Co-Evaluation/Treatment: Yes Reason for Co-Treatment: To address functional/ADL transfers PT goals addressed during session: Mobility/safety with mobility;Balance;Proper use of DME;Strengthening/ROM OT goals addressed during session: ADL's and self-care       AM-PAC PT "6 Clicks" Mobility  Outcome Measure Help needed turning from your back to your side while in a flat bed without using bedrails?: A Lot Help needed moving from lying on your back to sitting on the side of a flat bed without using bedrails?: A Lot Help needed moving to and from a bed to a chair (including a wheelchair)?: A Lot Help needed standing up from a chair using your arms (e.g., wheelchair or bedside chair)?: A Little Help needed to walk in hospital room?: A Lot Help needed climbing 3-5 steps with a railing? : A Lot 6 Click Score: 13    End of Session  Equipment Utilized During Treatment: Gait belt Activity Tolerance: Patient tolerated treatment well;Patient limited by fatigue;Patient limited by pain Patient left: in chair;with call bell/phone within reach;with family/visitor present Nurse Communication: Mobility status PT Visit Diagnosis: Unsteadiness on feet (R26.81);Other  abnormalities of gait and mobility (R26.89);Muscle weakness (generalized) (M62.81)    Time: 1478-2956 PT Time Calculation (min) (ACUTE ONLY): 29 min   Charges:   PT Evaluation $PT Eval Low Complexity: 1 Low PT Treatments $Therapeutic Activity: 8-22 mins PT General Charges $$ ACUTE PT VISIT: 1 Visit         Armond Bertin, PT, DPT [facility] Office: [phone] 3:01 PM, 08/27/23

## 2023-08-27 NOTE — ED Notes (Signed)
 Room cold due to thermostat not working. Charge nurse aware. Warm blankets provided to pt.

## 2023-08-28 DIAGNOSIS — N179 Acute kidney failure, unspecified: Secondary | ICD-10-CM | POA: Diagnosis not present

## 2023-08-28 LAB — CBC WITH DIFFERENTIAL/PLATELET
Abs Immature Granulocytes: 0.04 10*3/uL (ref 0.00–0.07)
Basophils Absolute: 0.1 10*3/uL (ref 0.0–0.1)
Basophils Relative: 1 %
Eosinophils Absolute: 0.2 10*3/uL (ref 0.0–0.5)
Eosinophils Relative: 2 %
HCT: 33 % — ABNORMAL LOW (ref 39.0–52.0)
Hemoglobin: 11.8 g/dL — ABNORMAL LOW (ref 13.0–17.0)
Immature Granulocytes: 0 %
Lymphocytes Relative: 9 %
Lymphs Abs: 0.9 10*3/uL (ref 0.7–4.0)
MCH: 33.6 pg (ref 26.0–34.0)
MCHC: 35.8 g/dL (ref 30.0–36.0)
MCV: 94 fL (ref 80.0–100.0)
Monocytes Absolute: 0.9 10*3/uL (ref 0.1–1.0)
Monocytes Relative: 10 %
Neutro Abs: 7.3 10*3/uL (ref 1.7–7.7)
Neutrophils Relative %: 78 %
Platelets: 259 10*3/uL (ref 150–400)
RBC: 3.51 MIL/uL — ABNORMAL LOW (ref 4.22–5.81)
RDW: 12.8 % (ref 11.5–15.5)
WBC: 9.4 10*3/uL (ref 4.0–10.5)
nRBC: 0 % (ref 0.0–0.2)

## 2023-08-28 LAB — BASIC METABOLIC PANEL WITH GFR
Anion gap: 9 (ref 5–15)
BUN: 34 mg/dL — ABNORMAL HIGH (ref 8–23)
CO2: 23 mmol/L (ref 22–32)
Calcium: 7.9 mg/dL — ABNORMAL LOW (ref 8.9–10.3)
Chloride: 105 mmol/L (ref 98–111)
Creatinine, Ser: 1.08 mg/dL (ref 0.61–1.24)
GFR, Estimated: >60 mL/min (ref >60)
Glucose, Bld: 119 mg/dL — ABNORMAL HIGH (ref 70–99)
Potassium: 3.4 mmol/L — ABNORMAL LOW (ref 3.5–5.1)
Sodium: 137 mmol/L (ref 135–145)

## 2023-08-28 LAB — GLUCOSE, CAPILLARY
Glucose-Capillary: 115 mg/dL — ABNORMAL HIGH (ref 70–99)
Glucose-Capillary: 148 mg/dL — ABNORMAL HIGH (ref 70–99)
Glucose-Capillary: 83 mg/dL (ref 70–99)
Glucose-Capillary: 138 mg/dL — ABNORMAL HIGH (ref 70–99)

## 2023-08-28 LAB — MAGNESIUM: Magnesium: 2.2 mg/dL (ref 1.7–2.4)

## 2023-08-28 LAB — PHOSPHORUS: Phosphorus: 2.8 mg/dL (ref 2.5–4.6)

## 2023-08-28 MED ORDER — ENOXAPARIN SODIUM 40 MG/0.4ML IJ SOSY
40.0000 mg | PREFILLED_SYRINGE | INTRAMUSCULAR | Status: DC
  Administered 2023-08-29: 40 mg via SUBCUTANEOUS
  Filled 2023-08-28: qty 0.4

## 2023-08-28 MED ORDER — CHLORHEXIDINE GLUCONATE CLOTH 2 % EX PADS
6.0000 | MEDICATED_PAD | Freq: Every day | CUTANEOUS | Status: DC
  Administered 2023-08-28 – 2023-08-29 (×2): 6 via TOPICAL

## 2023-08-28 NOTE — Plan of Care (Signed)

## 2023-08-28 NOTE — Progress Notes (Signed)
 PROGRESS NOTE  Mark Brady, is a 89 y.o. male, DOB - 05/17/1929, ZOX:[id number]  Admit date - 08/26/2023   Admitting Physician Rayfield Cairo, MD  Outpatient Primary MD for the patient is Cook, Jayce G, DO  LOS - 1  Chief Complaint  Patient presents with   Fall       Brief Narrative:   89 y.o. male with medical history significant of  HTN, HLD, DM, CVA, arthritis, type II diabetes mellitus, GERD, Parkinson's disease, and increased intraocular pressure.  He was recently admitted 08/21/23-08/23/23 after a fall where he was noted to have (R) sided rib fractures, discharged to SNF rehab on 08/23/2023, readmitted on 08/27/2023 after another fall this time at SNF facility    -Assessment and Plan: 1)Status post mechanical fall--- in the setting of Parkinson's disease --PT eval appreciated recommends SNF rehab -Recurrent falls---PTA pt lived at home and  did very poorly, patient has significant limitations with mobility related ADLs- this patient needs to continue to be monitored in the hospital until a SNF bed is obtained as she is not safe to go home with her current physcical limitations --- Currently awaiting insurance authorization for transfer back to SNF rehab  2)-Right-sided rib fractures - 10, 11 and 12 minimally displaced - Continue as needed analgesics and as needed muscle relaxants - Incentive spirometer has been ordered - Continue supportive care.   3)HTN- -continue amlodipine ,   4)-Leukocytosis---elevated WBCs in the setting of a stress demargination in the setting of recurrent falls WBC 14.1 >> 9.4 - No signs of acute infection appreciated  5-DM2-A1c previously less than 7 reflecting good diabetic control PTA Use Novolog /Humalog Sliding scale insulin  with Accu-Cheks/Fingersticks as ordered   6-GERD - Continue PPI.   7-hyperlipidemia - Continue Pravachol .   8-increased intraocular pressure - Continue xalatan  and cosopt    9-history of Parkinson disease---with  recurrent falls - Continue Sinemet .  10) acute urinary retention/BPH--Foley placed on 07/28/2023 -Continue Flomax '-Plan is for Foley removal and voiding trial on 08/29/2023   Status is: Inpatient   Disposition: The patient is from: SNF              Anticipated d/c is to: SNF              Anticipated d/c date is: 1 day              Patient currently is medically stable to d/c. Barriers: -Awaiting insurance authorization for transfer to SNF rehab  Code Status :  -  Code Status: Full Code   Family Communication:    Discussed with son at bedside  DVT Prophylaxis  :   - SCDs  enoxaparin  (LOVENOX ) injection 40 mg Start: 08/29/23 1000   Lab Results  Component Value Date   PLT 259 08/28/2023   Inpatient Medications  Scheduled Meds:  amLODipine   10 mg Oral Daily   carbidopa -levodopa   2 tablet Oral TID   Chlorhexidine Gluconate Cloth  6 each Topical Daily   dorzolamide -timolol   1 drop Both Eyes Daily   [START ON 08/29/2023] enoxaparin  (LOVENOX ) injection  40 mg Subcutaneous Q24H   insulin  aspart  0-5 Units Subcutaneous QHS   insulin  aspart  0-9 Units Subcutaneous TID WC   latanoprost   1 drop Both Eyes QHS   lidocaine   1 patch Transdermal Q24H   melatonin  3 mg Oral QHS   oxyCODONE -acetaminophen   1 tablet Oral Once   pantoprazole   40 mg Oral Daily   polyethylene glycol  17 g Oral Daily  pravastatin   40 mg Oral Daily   senna-docusate  1 tablet Oral BID   tamsulosin  0.4 mg Oral QPC supper   Continuous Infusions: PRN Meds:.acetaminophen  **OR** acetaminophen , haloperidol lactate, methocarbamol (ROBAXIN) injection, ondansetron  **OR** ondansetron  (ZOFRAN ) IV, mouth rinse, oxyCODONE    Anti-infectives (From admission, onward)    None       Subjective: Mark Brady today has no fevers, no emesis,  No chest pain,   - Patient's son is at bedside, questions answered -More coherent and more cooperative overall   Objective: Vitals:   08/27/23 2117 08/28/23 0256 08/28/23  1113 08/28/23 1234  BP: 130/84 (!) 118/59 130/66 (!) 106/56  Pulse: 85 80  80  Resp: 16 16  17   Temp: (!) 97.4 F (36.3 C) 98.3 F (36.8 C)  98.5 F (36.9 C)  TempSrc: Oral Oral  Oral  SpO2: 95% 96%  96%  Weight:      Height:        Intake/Output Summary (Last 24 hours) at 08/28/2023 1741 Last data filed at 08/28/2023 0900 Gross per 24 hour  Intake 968.17 ml  Output 1400 ml  Net -431.83 ml   Filed Weights   08/26/23 2240  Weight: 77 kg    Physical Exam  Gen:- Awake Alert,  in no apparent distress  HEENT:- [place].AT, No sclera icterus Neck-Supple Neck,No JVD,.  Lungs-  CTAB , fair symmetrical air movement CV- S1, S2 normal, regular  Abd-  +ve B.Sounds, Abd Soft, No tenderness,    Extremity/Skin:- No  edema, pedal pulses present  Psych-affect is appropriate, oriented x3 Neuro-generalized weakness, no new focal deficits, no tremors GU-Foley in situ placed on 08/27/2023, to be removed on 08/29/2023  Data Reviewed: I have personally reviewed following labs and imaging studies  CBC: Recent Labs  Lab 08/22/23 0422 08/27/23 0009 08/28/23 0440  WBC 14.3* 14.1* 9.4  NEUTROABS  --   --  7.3  HGB 12.6* 11.9* 11.8*  HCT 38.9* 35.2* 33.0*  MCV 97.5 94.9 94.0  PLT 315 244 259   Basic Metabolic Panel: Recent Labs  Lab 08/22/23 0422 08/27/23 0009 08/28/23 0440  NA 136 131* 137  K 3.7 3.8 3.4*  CL 104 100 105  CO2 22 21* 23  GLUCOSE 122* 152* 119*  BUN 22 68* 34*  CREATININE 0.81 2.12* 1.08  CALCIUM 8.3* 7.9* 7.9*  MG  --  2.3 2.2  PHOS  --   --  2.8   GFR: Estimated Creatinine Clearance: 43.5 mL/min (by C-G formula based on SCr of 1.08 mg/dL). Liver Function Tests: Recent Labs  Lab 08/27/23 0009  AST 22  ALT 7  ALKPHOS 59  BILITOT 0.9  PROT 6.8  ALBUMIN 3.0*   Recent Results (from the past 240 hours)  MRSA Next Gen by PCR, Nasal     Status: None   Collection Time: 08/27/23  3:16 PM   Specimen: Nasal Mucosa; Nasal Swab  Result Value Ref Range Status    MRSA by PCR Next Gen NOT DETECTED NOT DETECTED Final    Comment: (NOTE) The GeneXpert MRSA Assay (FDA approved for NASAL specimens only), is one component of a comprehensive MRSA colonization surveillance program. It is not intended to diagnose MRSA infection nor to guide or monitor treatment for MRSA infections. Test performance is not FDA approved in patients less than 59 years old. Performed at [facility], [address] [place], [place] [address]     Radiology Studies: DG Shoulder Left Port Result Date: 08/27/2023 CLINICAL DATA:  Recent fall with left shoulder pain, initial encounter EXAM: LEFT SHOULDER COMPARISON:  None Available. FINDINGS: Degenerative changes of the acromioclavicular joint are seen. No acute fracture or dislocation is noted. No soft tissue changes are seen. IMPRESSION: Degenerative change without acute abnormality. Electronically Signed   By: Violeta Grey M.D.   On: 08/27/2023 00:05   DG Shoulder Right Port Result Date: 08/27/2023 CLINICAL DATA:  Recent fall with right shoulder pain, initial encounter EXAM: RIGHT SHOULDER - 1 VIEW COMPARISON:  None Available. FINDINGS: Degenerative changes of the acromioclavicular joint are seen. Remodeling of the acromion is noted with a high-riding humeral head consistent with chronic rotator cuff injury. Underlying bony thorax appears within normal limits. No other focal abnormality is noted. IMPRESSION: Chronic changes without acute abnormality. Electronically Signed   By: Violeta Grey M.D.   On: 08/27/2023 00:05   CT CHEST ABDOMEN PELVIS WO CONTRAST Result Date: 08/27/2023 CLINICAL DATA:  Recent fall EXAM: CT CHEST, ABDOMEN AND PELVIS WITHOUT CONTRAST TECHNIQUE: Multidetector CT imaging of the chest, abdomen and pelvis was performed following the standard protocol without IV contrast. RADIATION DOSE REDUCTION: This exam was performed according to the departmental dose-optimization program which includes automated exposure control,  adjustment of the mA and/or kV according to patient size and/or use of iterative reconstruction technique. COMPARISON:  08/21/2023 FINDINGS: CT CHEST FINDINGS Cardiovascular: Limited due to lack of IV contrast. Atherosclerotic calcifications are noted. No aneurysmal dilatation is seen. Coronary calcifications are noted. Heart is not significantly enlarged in size. Mediastinum/Nodes: Thoracic inlet is within normal limits. No hilar or mediastinal adenopathy is noted. The esophagus as visualized is within normal limits. Lungs/Pleura: Basilar atelectasis is noted increased on the right when compared with the prior exam with a small right-sided pleural effusion. No sizable parenchymal nodule is seen. Stable less than 5 mm nodule is noted in the left upper lobe best seen on image number 47 of series 4. no follow-up is recommended. Musculoskeletal: Fractures of the right posterior eighth through eleventh ribs are again seen without pneumothorax. The atelectasis and new effusion is likely reactive in nature. CT ABDOMEN PELVIS FINDINGS Hepatobiliary: No focal liver abnormality is seen. No gallstones, gallbladder wall thickening, or biliary dilatation. Pancreas: Unremarkable. No pancreatic ductal dilatation or surrounding inflammatory changes. Spleen: Normal in size without focal abnormality. Adrenals/Urinary Tract: Adrenal glands are within normal limits. Kidneys are well visualized bilaterally with hydronephrosis and hydroureter new from the prior exam. This is felt to be related to over distention of the bladder as no calculi are seen. No renal calculi are noted. The bladder is well distended. Stomach/Bowel: No obstructive or inflammatory changes of colon are seen. Fecal material is noted throughout the colon. The appendix is not well visualized although no inflammatory changes to suggest appendicitis are noted. Small bowel and stomach are unremarkable. Vascular/Lymphatic: Aortic atherosclerosis. No enlarged abdominal or  pelvic lymph nodes. Reproductive: Prostate is enlarged. Other: No abdominal wall hernia or abnormality. No abdominopelvic ascites. Musculoskeletal: There are degenerative changes of lumbar spine are noted. IMPRESSION: CT of the chest: Stable appearing right-sided ribs with new reactive atelectasis and small effusion. CT of the abdomen and pelvis: Bilateral hydronephrosis and hydroureter secondary to an over distended bladder. No stones are seen. Electronically Signed   By: Violeta Grey M.D.   On: 08/27/2023 00:03   CT HEAD WO CONTRAST Result Date: 08/26/2023 CLINICAL DATA:  Recent fall with headaches and neck pain, initial encounter EXAM: CT HEAD WITHOUT CONTRAST CT CERVICAL SPINE WITHOUT CONTRAST TECHNIQUE: Multidetector  CT imaging of the head and cervical spine was performed following the standard protocol without intravenous contrast. Multiplanar CT image reconstructions of the cervical spine were also generated. RADIATION DOSE REDUCTION: This exam was performed according to the departmental dose-optimization program which includes automated exposure control, adjustment of the mA and/or kV according to patient size and/or use of iterative reconstruction technique. COMPARISON:  08/21/2023 FINDINGS: CT HEAD FINDINGS Brain: No evidence of acute infarction, hemorrhage, hydrocephalus, extra-axial collection or mass lesion/mass effect. Chronic atrophic and ischemic changes are again seen and stable. Vascular: No hyperdense vessel or unexpected calcification. Skull: Normal. Negative for fracture or focal lesion. Sinuses/Orbits: Orbits and their contents are within normal limits. Some sclerotic changes are noted about the sphenoid sinus on the left with opacification similar to that seen on wall. Other: None CT CERVICAL SPINE FINDINGS Alignment: Loss of the normal cervical lordosis. Skull base and vertebrae: 7 cervical segments are well visualized. Vertebral body height is well maintained. Minimal anterolisthesis of  C4 on C5 of a degenerative nature is noted. No acute fracture or acute facet abnormality is noted. Degenerative facet hypertrophic changes are seen. Soft tissues and spinal canal: Surrounding soft tissue structures are within normal limits. Upper chest: Visualized lung apices are unremarkable. Other: None IMPRESSION: CT of the head: Chronic changes as described stable from the recent exam. CT of the cervical spine: Multilevel degenerative change without acute abnormality. Electronically Signed   By: Violeta Grey M.D.   On: 08/26/2023 23:50   CT CERVICAL SPINE WO CONTRAST Result Date: 08/26/2023 CLINICAL DATA:  Recent fall with headaches and neck pain, initial encounter EXAM: CT HEAD WITHOUT CONTRAST CT CERVICAL SPINE WITHOUT CONTRAST TECHNIQUE: Multidetector CT imaging of the head and cervical spine was performed following the standard protocol without intravenous contrast. Multiplanar CT image reconstructions of the cervical spine were also generated. RADIATION DOSE REDUCTION: This exam was performed according to the departmental dose-optimization program which includes automated exposure control, adjustment of the mA and/or kV according to patient size and/or use of iterative reconstruction technique. COMPARISON:  08/21/2023 FINDINGS: CT HEAD FINDINGS Brain: No evidence of acute infarction, hemorrhage, hydrocephalus, extra-axial collection or mass lesion/mass effect. Chronic atrophic and ischemic changes are again seen and stable. Vascular: No hyperdense vessel or unexpected calcification. Skull: Normal. Negative for fracture or focal lesion. Sinuses/Orbits: Orbits and their contents are within normal limits. Some sclerotic changes are noted about the sphenoid sinus on the left with opacification similar to that seen on wall. Other: None CT CERVICAL SPINE FINDINGS Alignment: Loss of the normal cervical lordosis. Skull base and vertebrae: 7 cervical segments are well visualized. Vertebral body height is well  maintained. Minimal anterolisthesis of C4 on C5 of a degenerative nature is noted. No acute fracture or acute facet abnormality is noted. Degenerative facet hypertrophic changes are seen. Soft tissues and spinal canal: Surrounding soft tissue structures are within normal limits. Upper chest: Visualized lung apices are unremarkable. Other: None IMPRESSION: CT of the head: Chronic changes as described stable from the recent exam. CT of the cervical spine: Multilevel degenerative change without acute abnormality. Electronically Signed   By: Violeta Grey M.D.   On: 08/26/2023 23:50   Scheduled Meds:  amLODipine   10 mg Oral Daily   carbidopa -levodopa   2 tablet Oral TID   Chlorhexidine Gluconate Cloth  6 each Topical Daily   dorzolamide -timolol   1 drop Both Eyes Daily   [START ON 08/29/2023] enoxaparin  (LOVENOX ) injection  40 mg Subcutaneous Q24H   insulin  aspart  0-5 Units Subcutaneous QHS   insulin  aspart  0-9 Units Subcutaneous TID WC   latanoprost   1 drop Both Eyes QHS   lidocaine   1 patch Transdermal Q24H   melatonin  3 mg Oral QHS   oxyCODONE -acetaminophen   1 tablet Oral Once   pantoprazole   40 mg Oral Daily   polyethylene glycol  17 g Oral Daily   pravastatin   40 mg Oral Daily   senna-docusate  1 tablet Oral BID   tamsulosin  0.4 mg Oral QPC supper   Continuous Infusions:   LOS: 1 day    Colin Dawley M.D on 08/28/2023 at 5:41 PM  Go to [email or web address] - for contact info  [facility] - Office  [phone]  If 7PM-7AM, please contact night-coverage [email or web address] 08/28/2023, 5:41 PM

## 2023-08-28 NOTE — Progress Notes (Signed)
 This RN walked passed patients room to see him standing at the bedside ripping his gown and mittens off. Upon entering the room, this RN calmly asked patient to lay back down in the bed. Patient was extremely agitated and screaming that he was not going to get back in the bed and that he was going to slap me in the face if I touched him. This RN then called out for help and other staff of the unit arrived in the room to help get him back to bed. This RN and NT Lexi guided patient back to the bed and got him comfortable in the bed. He was still agitated at this point and attempting to rip his foley catheter out and still making verbal threats of hitting this RN. Patient still making attempts to get out of bed at this time. This RN then administered haldol to calm patient down. Patient now resting comfortably.

## 2023-08-29 LAB — GLUCOSE, CAPILLARY
Glucose-Capillary: 96 mg/dL (ref 70–99)
Glucose-Capillary: 153 mg/dL — ABNORMAL HIGH (ref 70–99)
Glucose-Capillary: 103 mg/dL — ABNORMAL HIGH (ref 70–99)

## 2023-08-29 MED ORDER — OXYCODONE HCL 5 MG PO TABS: 5.0000 mg | ORAL_TABLET | Freq: Four times a day (QID) | ORAL | 0 refills | Status: DC | PRN

## 2023-08-29 MED ORDER — TAMSULOSIN HCL 0.4 MG PO CAPS: 0.4000 mg | ORAL_CAPSULE | Freq: Every day | ORAL | 3 refills | Status: DC

## 2023-08-29 MED ORDER — INSULIN LISPRO (1 UNIT DIAL) 100 UNIT/ML (KWIKPEN)
0.0000 [IU] | PEN_INJECTOR | Freq: Three times a day (TID) | SUBCUTANEOUS | 0 refills | Status: DC
Start: 2023-08-29 — End: 2023-08-30

## 2023-08-29 MED ORDER — SENNOSIDES-DOCUSATE SODIUM 8.6-50 MG PO TABS: 2.0000 | ORAL_TABLET | Freq: Every day | ORAL | 2 refills | Status: DC

## 2023-08-29 MED ORDER — ENSURE ENLIVE PO LIQD
237.0000 mL | Freq: Two times a day (BID) | ORAL | Status: DC
  Administered 2023-08-29: 237 mL via ORAL

## 2023-08-29 MED ORDER — POLYETHYLENE GLYCOL 3350 17 G PO PACK: 17.0000 g | PACK | Freq: Every day | ORAL | 1 refills | Status: DC

## 2023-08-29 NOTE — TOC Transition Note (Addendum)
 Transition of Care [facility]) - Discharge Note   Patient Details  Name: Mark Brady MRN: [id number] Date of Birth: [birth year]  Transition [facility] CM/SW Contact:  Orelia Binet, RN Phone Number: 08/29/2023, 10:10 AM   Clinical Narrative:   Lynnda Sas received, navi auth id [id number], plan auth id [id number], CM called Tami at [facility] for a room number. Following to send DC summary, Left a message to update his wife.   Addendum: Tami updated, and approved, Patient will return with a Foley, DC summary updated and sent in the hub.    Final next level of care: Skilled Nursing Facility Barriers to Discharge: Barriers Resolved             Patient to be transferred to facility by: staff Name of family member notified: wife - left message Patient and family notified of of transfer: 08/29/23  Discharge Plan and Services Additional resources added to the After Visit Summary for          Social Drivers of Health (SDOH) Interventions SDOH Screenings   Food Insecurity: No Food Insecurity (08/27/2023)  Housing: Low Risk  (08/27/2023)  Transportation Needs: No Transportation Needs (08/27/2023)  Utilities: Not At Risk (08/27/2023)  Alcohol Screen: Low Risk  (05/17/2023)  Depression (PHQ2-9): Low Risk  (05/29/2023)  Financial Resource Strain: Low Risk  (05/17/2023)  Physical Activity: Insufficiently Active (05/17/2023)  Social Connections: Moderately Integrated (08/27/2023)  Stress: No Stress Concern Present (05/17/2023)  Tobacco Use: Low Risk  (08/26/2023)  Health Literacy: Patient Declined (05/17/2023)     Readmission Risk Interventions    08/29/2023   10:10 AM  Readmission Risk Prevention Plan  Transportation Screening Complete  PCP or Specialist Appt within 3-5 Days Complete  HRI or Home Care Consult Complete  Social Work Consult for Recovery Care Planning/Counseling Complete  Palliative Care Screening Not Applicable  Medication Review Oceanographer) Complete

## 2023-08-29 NOTE — Discharge Instructions (Addendum)
 1)Avoid ibuprofen /Advil /Aleve/Motrin /Goody Powders/Naproxen/BC powders/Meloxicam/Diclofenac/Indomethacin and other Nonsteroidal anti-inflammatory medications as these will make you more likely to bleed and can cause stomach ulcers, can also cause Kidney problems.   2)Repeat CBC and BMP in 5 to 7 days  3)Routine Foley catheter care  4)Please follow-up with Urologist Dr. Claretta Croft in about 2 weeks --- for Foley catheter removal and voiding trial and recheck and follow-up evaluation  in his office----[facility], [address], [address], [place] [address] Phone Number----[phone]

## 2023-08-29 NOTE — Discharge Summary (Signed)
 Mark Brady, is a 89 y.o. male  DOB [birth year]  MRN [id number].  Admission date:  08/26/2023  Admitting Physician  Rayfield Cairo, MD  Discharge Date:  08/29/2023   Primary MD  Cook, Jayce G, DO  Recommendations for primary care physician for things to follow:   1)Avoid ibuprofen /Advil /Aleve/Motrin Juluis Ok Powders/Naproxen/BC powders/Meloxicam/Diclofenac/Indomethacin and other Nonsteroidal anti-inflammatory medications as these will make you more likely to bleed and can cause stomach ulcers, can also cause Kidney problems.   2)Repeat CBC and BMP in 5 to 7 days  3)Routine Foley catheter care  4)Please follow-up with Urologist Dr. Claretta Croft in about 2 weeks --- for Foley catheter removal and voiding trial and recheck and follow-up evaluation  in his office----[facility], [address], [address], [place] [address] Phone Number----[phone]  Admission Diagnosis  Urinary retention [R33.9] AKI (acute kidney injury) [facility]) [N17.9]   Discharge Diagnosis  Urinary retention [R33.9] AKI (acute kidney injury) (HCC) [N17.9]    Principal Problem:   AKI (acute kidney injury) (HCC) Active Problems:   Essential hypertension, benign   Hyperlipidemia   Diabetes (HCC)   Gastroesophageal reflux disease without esophagitis   Rib fractures   History of Parkinson's disease   Acute urinary retention   Hyponatremia   Hypokalemia      Past Medical History:  Diagnosis Date   Arthritis    Diabetes mellitus without complication (HCC)    diet controlled   History of elevated PSA    Hyperlipidemia    Hypertension    Stroke [facility])     Past Surgical History:  Procedure Laterality Date   CATARACT EXTRACTION W/PHACO Right 01/26/2014   Procedure: CATARACT EXTRACTION PHACO AND INTRAOCULAR LENS PLACEMENT (IOC);  Surgeon: Lavonia Powers T. Gennie Kicks, MD;  Location: [facility];  Service: Ophthalmology;   Laterality: Right;  CDE 6.53   CATARACT EXTRACTION W/PHACO Left 02/09/2014   Procedure: CATARACT EXTRACTION PHACO AND INTRAOCULAR LENS PLACEMENT (IOC);  Surgeon: Lavonia Powers T. Gennie Kicks, MD;  Location: [facility];  Service: Ophthalmology;  Laterality: Left;  CDE:5.58   COLONOSCOPY     YAG LASER APPLICATION Right 12/07/2014   Procedure: YAG LASER APPLICATION;  Surgeon: Albert Huff, MD;  Location: [facility];  Service: Ophthalmology;  Laterality: Right;     HPI  from the history and physical done on the day of admission:   HPI: Mark Brady is a 89 y.o. male with medical history significant of  HTN, HLD, DM, CVA, arthritis, type II diabetes mellitus, GERD, Parkinson's disease, and increased intraocular pressure.  He was recently admitted 08/21/23-08/23/23 after a fall where he was noted to have (R) sided rib fractures. Today he presents to any pain ED after another unwitnessed fall.  He was noted to have an abrasion on the left side of his head, not on any blood thinners.  In the ED he endorsed bilateral shoulder and chest pain.  He is otherwise a poor historian, able to answer yes/no questions with questionable reliability of answers.   ED Course: On arrival to  [facility] patient was noted to be afebrile temp 36.7 C, BP 103/84, HR 80, RR 16, SpO2 90% on room air.  CT Head and C-spine negative for acute abnormalities, CT Chest with stable (R) rib fractures and new atelectasis and small effusion, CT abd/pelvis with bilateral hydronephrosis and hydroureter with distended bladder, plain film of bilateral shoulders negative for acute abnormalities.  Labs notable for mild hyponatremia sodium 131, CO2 21, glucose 152, BUN 68, creatinine 2.12, WBC 14.1, hemoglobin 11.9, troponin negative x 2.  He received Percocet, 1 L LR bolus, and continuous LR. TR[facility] contacted for admission.   Review of Systems: As mentioned in the history of present illness. All other systems reviewed and are negative.   Hospital Course:   Brief  Narrative:   89 y.o. male with medical history significant of  HTN, HLD, DM, CVA, arthritis, type II diabetes mellitus, GERD, Parkinson's disease, and increased intraocular pressure.  He was recently admitted 08/21/23-08/23/23 after a fall where he was noted to have (R) sided rib fractures, discharged to SNF rehab on 08/23/2023, readmitted on 08/27/2023 after another fall this time at SNF facility     -Assessment and Plan: 1)Status post mechanical fall--- recurrent falls in the setting of Parkinson's disease --PT eval appreciated recommends SNF rehab -   2)-Right-sided rib fractures - 10, 11 and 12 minimally displaced - Continue as needed analgesics - Incentive spirometer has been ordered - Continue supportive care.   3)HTN- -continue amlodipine ,   4)-Leukocytosis---elevated WBCs in the setting of a stress demargination in the setting of recurrent falls WBC 14.1 >> 9.4 - No signs of acute infection appreciated   5-DM2-A1c previously less than 7 reflecting good diabetic control PTA -Short acting Humalog insulin  per sliding scale 0-10 ---:-- Insulin  injection 0-10 Units 0-10 Units Subcutaneous, 3 times daily with meals CBG 70 - 120: 0 unit CBG 121 - 150: 0 unit  CBG 151 - 200: 1 unit CBG 201 - 250: 2 units CBG 251 - 300: 4 units CBG 301 - 350: 6 units  CBG 351 - 400: 8 units  CBG > 400: 10 units Use Novolog /Humalog Sliding scale insulin  with Accu-Cheks/Fingersticks as ordered    6-GERD - Continue PPI.   7-hyperlipidemia - Continue Pravachol .   8-increased intraocular pressure - Continue xalatan  and cosopt    9-history of Parkinson disease---with recurrent falls - Continue Sinemet .   10) acute urinary retention/BPH--Foley placed on 07/28/2023 - Patient failed voiding trial on 08/29/2023 -Foley catheter reinserted -Continue Flomax -Outpatient follow-up with urologist Dr. Claretta Croft in about 2 weeks for further removal and voiding trial   Disposition: The patient is from: SNF               Anticipated d/c is to: SNF  Discharge Condition: Stable  Follow UP   Contact information for follow-up providers     McKenzie, Arden Beck, MD. Schedule an appointment as soon as possible for a visit in 2 week(s).   Specialty: Urology Why: For Foley removal and voiding trial Contact information: [address] [place] [address] [phone]              Contact information for after-discharge care     Destination     [facility] .   Service: Skilled Nursing Contact information: [address] [place]  [address] [phone]                    Diet and Activity  recommendation:  As advised  Discharge Instructions    Discharge Instructions     Call MD for:  difficulty breathing, headache or visual disturbances   Complete by: As directed    Call MD for:  persistant dizziness or light-headedness   Complete by: As directed    Call MD for:  persistant nausea and vomiting   Complete by: As directed    Call MD for:  temperature >100.4   Complete by: As directed    Diet - low sodium heart healthy   Complete by: As directed    Discharge instructions   Complete by: As directed    1)Avoid ibuprofen /Advil /Aleve/Motrin /Goody Powders/Naproxen/BC powders/Meloxicam/Diclofenac/Indomethacin and other Nonsteroidal anti-inflammatory medications as these will make you more likely to bleed and can cause stomach ulcers, can also cause Kidney problems.   2)Repeat CBC and BMP in 5 to 7 days  3)Routine Foley catheter care  4)Please follow-up with Urologist Dr. Claretta Croft in about 2 weeks --- for Foley catheter removal and voiding trial and recheck and follow-up evaluation  in his office----[facility], [address], [place] [address] Phone Number----[phone]  5)Short acting Humalog insulin  per sliding scale 0-10 ---:-- Insulin  injection 0-10 Units 0-10 Units Subcutaneous, 3 times  daily with meals CBG 70 - 120: 0 unit CBG 121 - 150: 0 unit  CBG 151 - 200: 1 unit CBG 201 - 250: 2 units CBG 251 - 300: 4 units CBG 301 - 350: 6 units  CBG 351 - 400: 8 units  CBG > 400: 10 units   Increase activity slowly   Complete by: As directed          Discharge Medications     Allergies as of 08/29/2023       Reactions   Penicillins Other (See Comments)   Unknown, no reaction listed on MAR   Aspirin  Rash   No reaction listed on MAR for verification        Medication List     STOP taking these medications    meloxicam 7.5 MG tablet Commonly known as: MOBIC       TAKE these medications    Accu-Chek Aviva Plus test strip Generic drug: glucose blood USE TO CHECK BLOOD SUGAR DAILY   Accu-Chek Softclix Lancets lancets USE TO CHECK BLOOD SUGAR DAILY   acetaminophen  500 MG tablet Commonly known as: TYLENOL  Take 2 tablets (1,000 mg total) by mouth every 8 (eight) hours as needed for mild pain (pain score 1-3) or headache (or Fever >/= 101).   amLODipine  10 MG tablet Commonly known as: NORVASC  Take 1 tablet (10 mg total) by mouth daily.   carbidopa -levodopa  25-100 MG tablet Commonly known as: SINEMET  IR Take 2 tablets by mouth 3 (three) times daily. 7am/11am/4pm What changed: additional instructions   cyanocobalamin  2000 MCG tablet Take 1,000 mcg by mouth daily.   dorzolamide -timolol  2-0.5 % ophthalmic solution Commonly known as: COSOPT  Place 1 drop into both eyes daily.   insulin  lispro 100 UNIT/ML KwikPen Commonly known as: HumaLOG KwikPen Inject 0-10 Units into the skin 3 (three) times daily. Short acting Humalog insulin  per sliding scale 0-10 ---:-- Insulin  injection 0-10 Units 0-10 Units Subcutaneous, 3 times daily with meals CBG 70 - 120: 0 unit CBG 121 - 150: 0 unit  CBG 151 - 200: 1 unit CBG 201 - 250: 2 units CBG 251 - 300: 4 units CBG 301 - 350: 6 units  CBG 351 - 400: 8 units  CBG > 400:  10 units   latanoprost  0.005 % ophthalmic  solution Commonly known as: XALATAN  Place 1 drop into both eyes at bedtime.   lidocaine  5 % Commonly known as: LIDODERM  Place 1 patch onto the skin daily. Remove & Discard patch within 12 hours; apply to affected area (right sided costal zone)   losartan  100 MG tablet Commonly known as: COZAAR  TAKE 1 TABLET(100 MG) BY MOUTH DAILY   omeprazole  40 MG capsule Commonly known as: PRILOSEC Take 40 mg by mouth daily.   oxyCODONE  5 MG immediate release tablet Commonly known as: Oxy IR/ROXICODONE  Take 1 tablet (5 mg total) by mouth every 6 (six) hours as needed for severe pain (pain score 7-10).   polyethylene glycol 17 g packet Commonly known as: MIRALAX / GLYCOLAX Take 17 g by mouth daily. Start taking on: Aug 30, 2023   pravastatin  40 MG tablet Commonly known as: PRAVACHOL  TAKE 1 TABLET(40 MG) BY MOUTH DAILY   senna-docusate 8.6-50 MG tablet Commonly known as: Senokot-S Take 2 tablets by mouth at bedtime.   tamsulosin 0.4 MG Caps capsule Commonly known as: FLOMAX Take 1 capsule (0.4 mg total) by mouth daily after supper.       Major procedures and Radiology Reports - PLEASE review detailed and final reports for all details, in brief -   DG Shoulder Left Port Result Date: 08/27/2023 CLINICAL DATA:  Recent fall with left shoulder pain, initial encounter EXAM: LEFT SHOULDER COMPARISON:  None Available. FINDINGS: Degenerative changes of the acromioclavicular joint are seen. No acute fracture or dislocation is noted. No soft tissue changes are seen. IMPRESSION: Degenerative change without acute abnormality. Electronically Signed   By: Violeta Grey M.D.   On: 08/27/2023 00:05   DG Shoulder Right Port Result Date: 08/27/2023 CLINICAL DATA:  Recent fall with right shoulder pain, initial encounter EXAM: RIGHT SHOULDER - 1 VIEW COMPARISON:  None Available. FINDINGS: Degenerative changes of the acromioclavicular joint are seen. Remodeling of the acromion is noted with a high-riding  humeral head consistent with chronic rotator cuff injury. Underlying bony thorax appears within normal limits. No other focal abnormality is noted. IMPRESSION: Chronic changes without acute abnormality. Electronically Signed   By: Violeta Grey M.D.   On: 08/27/2023 00:05   CT CHEST ABDOMEN PELVIS WO CONTRAST Result Date: 08/27/2023 CLINICAL DATA:  Recent fall EXAM: CT CHEST, ABDOMEN AND PELVIS WITHOUT CONTRAST TECHNIQUE: Multidetector CT imaging of the chest, abdomen and pelvis was performed following the standard protocol without IV contrast. RADIATION DOSE REDUCTION: This exam was performed according to the departmental dose-optimization program which includes automated exposure control, adjustment of the mA and/or kV according to patient size and/or use of iterative reconstruction technique. COMPARISON:  08/21/2023 FINDINGS: CT CHEST FINDINGS Cardiovascular: Limited due to lack of IV contrast. Atherosclerotic calcifications are noted. No aneurysmal dilatation is seen. Coronary calcifications are noted. Heart is not significantly enlarged in size. Mediastinum/Nodes: Thoracic inlet is within normal limits. No hilar or mediastinal adenopathy is noted. The esophagus as visualized is within normal limits. Lungs/Pleura: Basilar atelectasis is noted increased on the right when compared with the prior exam with a small right-sided pleural effusion. No sizable parenchymal nodule is seen. Stable less than 5 mm nodule is noted in the left upper lobe best seen on image number 47 of series 4. no follow-up is recommended. Musculoskeletal: Fractures of the right posterior eighth through eleventh ribs are again seen without pneumothorax. The atelectasis and new effusion is likely reactive in nature. CT ABDOMEN PELVIS FINDINGS Hepatobiliary:  No focal liver abnormality is seen. No gallstones, gallbladder wall thickening, or biliary dilatation. Pancreas: Unremarkable. No pancreatic ductal dilatation or surrounding inflammatory  changes. Spleen: Normal in size without focal abnormality. Adrenals/Urinary Tract: Adrenal glands are within normal limits. Kidneys are well visualized bilaterally with hydronephrosis and hydroureter new from the prior exam. This is felt to be related to over distention of the bladder as no calculi are seen. No renal calculi are noted. The bladder is well distended. Stomach/Bowel: No obstructive or inflammatory changes of colon are seen. Fecal material is noted throughout the colon. The appendix is not well visualized although no inflammatory changes to suggest appendicitis are noted. Small bowel and stomach are unremarkable. Vascular/Lymphatic: Aortic atherosclerosis. No enlarged abdominal or pelvic lymph nodes. Reproductive: Prostate is enlarged. Other: No abdominal wall hernia or abnormality. No abdominopelvic ascites. Musculoskeletal: There are degenerative changes of lumbar spine are noted. IMPRESSION: CT of the chest: Stable appearing right-sided ribs with new reactive atelectasis and small effusion. CT of the abdomen and pelvis: Bilateral hydronephrosis and hydroureter secondary to an over distended bladder. No stones are seen. Electronically Signed   By: Violeta Grey M.D.   On: 08/27/2023 00:03   CT HEAD WO CONTRAST Result Date: 08/26/2023 CLINICAL DATA:  Recent fall with headaches and neck pain, initial encounter EXAM: CT HEAD WITHOUT CONTRAST CT CERVICAL SPINE WITHOUT CONTRAST TECHNIQUE: Multidetector CT imaging of the head and cervical spine was performed following the standard protocol without intravenous contrast. Multiplanar CT image reconstructions of the cervical spine were also generated. RADIATION DOSE REDUCTION: This exam was performed according to the departmental dose-optimization program which includes automated exposure control, adjustment of the mA and/or kV according to patient size and/or use of iterative reconstruction technique. COMPARISON:  08/21/2023 FINDINGS: CT HEAD FINDINGS Brain:  No evidence of acute infarction, hemorrhage, hydrocephalus, extra-axial collection or mass lesion/mass effect. Chronic atrophic and ischemic changes are again seen and stable. Vascular: No hyperdense vessel or unexpected calcification. Skull: Normal. Negative for fracture or focal lesion. Sinuses/Orbits: Orbits and their contents are within normal limits. Some sclerotic changes are noted about the sphenoid sinus on the left with opacification similar to that seen on wall. Other: None CT CERVICAL SPINE FINDINGS Alignment: Loss of the normal cervical lordosis. Skull base and vertebrae: 7 cervical segments are well visualized. Vertebral body height is well maintained. Minimal anterolisthesis of C4 on C5 of a degenerative nature is noted. No acute fracture or acute facet abnormality is noted. Degenerative facet hypertrophic changes are seen. Soft tissues and spinal canal: Surrounding soft tissue structures are within normal limits. Upper chest: Visualized lung apices are unremarkable. Other: None IMPRESSION: CT of the head: Chronic changes as described stable from the recent exam. CT of the cervical spine: Multilevel degenerative change without acute abnormality. Electronically Signed   By: Violeta Grey M.D.   On: 08/26/2023 23:50   CT CERVICAL SPINE WO CONTRAST Result Date: 08/26/2023 CLINICAL DATA:  Recent fall with headaches and neck pain, initial encounter EXAM: CT HEAD WITHOUT CONTRAST CT CERVICAL SPINE WITHOUT CONTRAST TECHNIQUE: Multidetector CT imaging of the head and cervical spine was performed following the standard protocol without intravenous contrast. Multiplanar CT image reconstructions of the cervical spine were also generated. RADIATION DOSE REDUCTION: This exam was performed according to the departmental dose-optimization program which includes automated exposure control, adjustment of the mA and/or kV according to patient size and/or use of iterative reconstruction technique. COMPARISON:   08/21/2023 FINDINGS: CT HEAD FINDINGS Brain: No evidence of acute  infarction, hemorrhage, hydrocephalus, extra-axial collection or mass lesion/mass effect. Chronic atrophic and ischemic changes are again seen and stable. Vascular: No hyperdense vessel or unexpected calcification. Skull: Normal. Negative for fracture or focal lesion. Sinuses/Orbits: Orbits and their contents are within normal limits. Some sclerotic changes are noted about the sphenoid sinus on the left with opacification similar to that seen on wall. Other: None CT CERVICAL SPINE FINDINGS Alignment: Loss of the normal cervical lordosis. Skull base and vertebrae: 7 cervical segments are well visualized. Vertebral body height is well maintained. Minimal anterolisthesis of C4 on C5 of a degenerative nature is noted. No acute fracture or acute facet abnormality is noted. Degenerative facet hypertrophic changes are seen. Soft tissues and spinal canal: Surrounding soft tissue structures are within normal limits. Upper chest: Visualized lung apices are unremarkable. Other: None IMPRESSION: CT of the head: Chronic changes as described stable from the recent exam. CT of the cervical spine: Multilevel degenerative change without acute abnormality. Electronically Signed   By: Violeta Grey M.D.   On: 08/26/2023 23:50   CT CHEST ABDOMEN PELVIS W CONTRAST Result Date: 08/21/2023 CLINICAL DATA:  Polytrauma, blunt R flank pain sp fall. Redness and bruising to right flank area. EXAM: CT CHEST, ABDOMEN, AND PELVIS WITH CONTRAST TECHNIQUE: Multidetector CT imaging of the chest, abdomen and pelvis was performed following the standard protocol during bolus administration of intravenous contrast. RADIATION DOSE REDUCTION: This exam was performed according to the departmental dose-optimization program which includes automated exposure control, adjustment of the mA and/or kV according to patient size and/or use of iterative reconstruction technique. CONTRAST:   OMNIPAQUE  IOHEXOL  300 MG/ML  SOLN COMPARISON:  CT scan abdomen from 06/27/2007 and CT scan chest, abdomen and pelvis from 03/31/2005. FINDINGS: CT CHEST FINDINGS Cardiovascular: Normal cardiac size. No pericardial effusion. No aortic aneurysm. There are coronary artery calcifications, in keeping with coronary artery disease. There are also mild-to-moderate peripheral atherosclerotic vascular calcifications of thoracic aorta and its major branches. Mediastinum/Nodes: Visualized thyroid  gland appears grossly unremarkable. No solid / cystic mediastinal masses. The esophagus is nondistended precluding optimal assessment. No axillary, mediastinal or hilar lymphadenopathy by size criteria. Lungs/Pleura: The central tracheo-bronchial tree is patent. There are dependent changes in bilateral lungs. No mass or consolidation. No pleural effusion or pneumothorax. There is a sub 4 mm solid noncalcified nodule in the left lung upper lobe (series 5, image 43), which is present since the prior study from 2006 and favored benign. No new or suspicious lung nodule. Musculoskeletal: The visualized soft tissues of the chest wall are grossly unremarkable. No suspicious osseous lesions. There are mild to moderate multilevel degenerative changes in the visualized spine. There are mildly displaced fractures of posterolateral right ninth through eleventh ribs. CT ABDOMEN PELVIS FINDINGS Hepatobiliary: The liver is normal in size. Non-cirrhotic configuration. These is diffuse hepatic steatosis. No suspicious mass. Note is made of at least 2, subcentimeter sized hypoattenuating foci, which are too small to adequately characterize. No intrahepatic or extrahepatic bile duct dilation. No calcified gallstones. Normal gallbladder wall thickness. No pericholecystic inflammatory changes. Pancreas: Small/atrophic pancreas. No peripancreatic fat stranding. There are several hypoattenuating structures in the pancreatic body (largest measuring up to 8 x  11 mm) and uncinate process measuring up to 10 x 15 mm, which are incompletely characterized on the current exam. The 2 adjacent lesions in the pancreatic body were not distinctly visualized on the prior study from 2009. However, the lesion in the uncinate process is present since the prior study from 2009.  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 Correlate clinically to determine the need for follow-up examination with MRI/MRCP in 2 years. Spleen: Within normal limits. No focal lesion. Adrenals/Urinary Tract: Adrenal glands are unremarkable. No suspicious renal mass. No hydronephrosis. No renal or ureteric calculi. Unremarkable urinary bladder. Stomach/Bowel: There is a tiny sliding hiatal hernia. No disproportionate dilation of the small or large bowel loops. No evidence of abnormal bowel wall thickening or inflammatory changes. The appendix is unremarkable. There are scattered diverticula mainly in the sigmoid colon, without imaging signs of diverticulitis. Vascular/Lymphatic: No ascites or pneumoperitoneum. No abdominal or pelvic lymphadenopathy, by size criteria. No aneurysmal dilation of the major abdominal arteries. There are moderate peripheral atherosclerotic vascular calcifications of the aorta and its major branches. Reproductive: Enlarged prostate. Correlation with serum PSA levels is recommended. Symmetric seminal vesicles. Other: The visualized soft tissues and abdominal wall are unremarkable. Musculoskeletal: No suspicious osseous lesions. There are mild - moderate multilevel degenerative changes in the visualized spine. IMPRESSION: 1. Mildly displaced fractures of posterolateral right ninth through eleventh ribs. Otherwise, no traumatic injury to the chest, abdomen or pelvis. 2. Severe pancreatic lesions and markedly enlarged prostate gland, as discussed above. 3. Multiple other nonacute observations, as described above. Aortic  Atherosclerosis (ICD10-I70.0). Electronically Signed   By: Beula Brunswick M.D.   On: 08/21/2023 09:39   CT HEAD WO CONTRAST Result Date: 08/21/2023 CLINICAL DATA:  Provided history: Head trauma, moderate/severe. Polytrauma, blunt. Additional history provided: Fall. EXAM: CT HEAD WITHOUT CONTRAST CT CERVICAL SPINE WITHOUT CONTRAST TECHNIQUE: Multidetector CT imaging of the head and cervical spine was performed following the standard protocol without intravenous contrast. Multiplanar CT image reconstructions of the cervical spine were also generated. RADIATION DOSE REDUCTION: This exam was performed according to the departmental dose-optimization program which includes automated exposure control, adjustment of the mA and/or kV according to patient size and/or use of iterative reconstruction technique. COMPARISON:  Head CT 12/05/2022.  Cervical spine CT 12/05/2022. FINDINGS: CT HEAD FINDINGS Brain: Generalized cerebral atrophy. 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 Background moderate-to-advanced patchy and ill-defined hypoattenuation within the cerebral white matter, nonspecific but compatible with chronic small vessel ischemic disease. There is no acute intracranial hemorrhage. No demarcated cortical infarct. No extra-axial fluid collection. No evidence of an intracranial mass. No midline shift. Vascular: No hyperdense vessel.  Atherosclerotic calcifications. Skull: No calvarial fracture or aggressive osseous lesion. Sinuses/Orbits: No mass or acute finding within the imaged orbits. 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 CT CERVICAL SPINE FINDINGS Alignment: Nonspecific reversal of the expected cervical or doses. 2 mm C3-C4 grade 1 anterolisthesis. 3 mm C4-C5 grade 1 anterolisthesis. 2 mm C5-C6 grade 1 anterolisthesis. Skull base and vertebrae: The basion-dental and atlanto-dental intervals are maintained.No evidence of acute  fracture to the cervical spine. Facet ankylosis on the right at C3-C4. Soft tissues and spinal canal: No prevertebral fluid or swelling. No visible canal hematoma. Disc levels: Cervical spondylosis with multilevel disc space narrowing, disc bulges/central disc protrusions, uncovertebral hypertrophy and facet arthropathy. Disc space narrowing is greatest at C6-C7 (moderate to advanced at this level). No appreciable high-grade spinal canal stenosis. Multilevel bony neural foraminal narrowing. Degenerative changes also present at the C1-C2 articulation. Multilevel ventral osteophytes. Upper chest: No consolidation within the imaged lung apices. No visible pneumothorax. IMPRESSION: CT head: 1.  No evidence of an acute intracranial abnormality. 2. Parenchymal atrophy and chronic small vessel ischemic disease. 3. Chronic severe left sphenoid  sinusitis. CT cervical spine: 1. No evidence of an acute cervical spine fracture. 2. Nonspecific reversal of the expected cervical lordosis. 3. Grade 1 anterolisthesis at C3-C4, C4-C5 and C5-C6, unchanged from the prior cervical spine CT of 12/05/2022. 4. Cervical spondylosis as described. 5. Facet ankylosis on the right at C3-C4. Electronically Signed   By: Bascom Lily D.O.   On: 08/21/2023 09:29   CT CERVICAL SPINE WO CONTRAST Result Date: 08/21/2023 CLINICAL DATA:  Provided history: Head trauma, moderate/severe. Polytrauma, blunt. Additional history provided: Fall. EXAM: CT HEAD WITHOUT CONTRAST CT CERVICAL SPINE WITHOUT CONTRAST TECHNIQUE: Multidetector CT imaging of the head and cervical spine was performed following the standard protocol without intravenous contrast. Multiplanar CT image reconstructions of the cervical spine were also generated. RADIATION DOSE REDUCTION: This exam was performed according to the departmental dose-optimization program which includes automated exposure control, adjustment of the mA and/or kV according to patient size and/or use of iterative  reconstruction technique. COMPARISON:  Head CT 12/05/2022.  Cervical spine CT 12/05/2022. FINDINGS: CT HEAD FINDINGS Brain: Generalized cerebral atrophy. 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 Background moderate-to-advanced patchy and ill-defined hypoattenuation within the cerebral white matter, nonspecific but compatible with chronic small vessel ischemic disease. There is no acute intracranial hemorrhage. No demarcated cortical infarct. No extra-axial fluid collection. No evidence of an intracranial mass. No midline shift. Vascular: No hyperdense vessel.  Atherosclerotic calcifications. Skull: No calvarial fracture or aggressive osseous lesion. Sinuses/Orbits: No mass or acute finding within the imaged orbits. 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 CT CERVICAL SPINE FINDINGS Alignment: Nonspecific reversal of the expected cervical or doses. 2 mm C3-C4 grade 1 anterolisthesis. 3 mm C4-C5 grade 1 anterolisthesis. 2 mm C5-C6 grade 1 anterolisthesis. Skull base and vertebrae: The basion-dental and atlanto-dental intervals are maintained.No evidence of acute fracture to the cervical spine. Facet ankylosis on the right at C3-C4. Soft tissues and spinal canal: No prevertebral fluid or swelling. No visible canal hematoma. Disc levels: Cervical spondylosis with multilevel disc space narrowing, disc bulges/central disc protrusions, uncovertebral hypertrophy and facet arthropathy. Disc space narrowing is greatest at C6-C7 (moderate to advanced at this level). No appreciable high-grade spinal canal stenosis. Multilevel bony neural foraminal narrowing. Degenerative changes also present at the C1-C2 articulation. Multilevel ventral osteophytes. Upper chest: No consolidation within the imaged lung apices. No visible pneumothorax. IMPRESSION: CT head: 1.  No evidence of an acute intracranial abnormality. 2. Parenchymal atrophy and chronic  small vessel ischemic disease. 3. Chronic severe left sphenoid sinusitis. CT cervical spine: 1. No evidence of an acute cervical spine fracture. 2. Nonspecific reversal of the expected cervical lordosis. 3. Grade 1 anterolisthesis at C3-C4, C4-C5 and C5-C6, unchanged from the prior cervical spine CT of 12/05/2022. 4. Cervical spondylosis as described. 5. Facet ankylosis on the right at C3-C4. Electronically Signed   By: Bascom Lily D.O.   On: 08/21/2023 09:29    Micro Results   Recent Results (from the past 240 hours)  MRSA Next Gen by PCR, Nasal     Status: None   Collection Time: 08/27/23  3:16 PM   Specimen: Nasal Mucosa; Nasal Swab  Result Value Ref Range Status   MRSA by PCR Next Gen NOT DETECTED NOT DETECTED Final    Comment: (NOTE) The GeneXpert MRSA Assay (FDA approved for NASAL specimens only), is one component of a comprehensive MRSA colonization surveillance program. It is not intended to diagnose MRSA infection nor to guide or monitor treatment for  MRSA infections. Test performance is not FDA approved in patients less than 57 years old. Performed at [facility], [address], [place], [place] [address]     Today   Subjective    Aser Mulhearn today has no new complaints -- Wife at bedside, questions answered - Oral intake is fair - Patient failed voiding trial, Foley catheter reinserted          Patient has been seen and examined prior to discharge   Objective   Blood pressure 116/62, pulse 65, temperature 98.2 F (36.8 C), temperature source Axillary, resp. rate 19, height 5\' 6"  (1.676 m), weight 77 kg, SpO2 97%.   Intake/Output Summary (Last 24 hours) at 08/29/2023 1525 Last data filed at 08/29/2023 1414 Gross per 24 hour  Intake 600 ml  Output 2325 ml  Net -1725 ml    Exam Gen:- Awake Alert, no acute distress  HEENT:- [place].AT, No sclera icterus Neck-Supple Neck,No JVD,.  Lungs-  CTAB , good air movement bilaterally CV- S1, S2 normal, regular Abd-   +ve B.Sounds, Abd Soft, No tenderness,    Extremity/Skin:- No  edema,   good pulses Psych-affect is appropriate, oriented x3 Neuro-general weakness, no new focal deficits, no tremors  GU--Foley in situ--reinserted on 08/29/2023 after failing voiding trial   Data Review   CBC w Diff:  Lab Results  Component Value Date   WBC 9.4 08/28/2023   HGB 11.8 (L) 08/28/2023   HGB 14.5 07/25/2022   HCT 33.0 (L) 08/28/2023   HCT 43.0 07/25/2022   PLT 259 08/28/2023   PLT 317 07/25/2022   LYMPHOPCT 9 08/28/2023   MONOPCT 10 08/28/2023   EOSPCT 2 08/28/2023   BASOPCT 1 08/28/2023   CMP:  Lab Results  Component Value Date   NA 137 08/28/2023   NA 141 07/25/2022   K 3.4 (L) 08/28/2023   CL 105 08/28/2023   CO2 23 08/28/2023   BUN 34 (H) 08/28/2023   BUN 18 07/25/2022   CREATININE 1.08 08/28/2023   CREATININE 1.15 09/21/2013   PROT 6.8 08/27/2023   PROT 6.9 07/25/2022   ALBUMIN 3.0 (L) 08/27/2023   ALBUMIN 4.5 07/25/2022   BILITOT 0.9 08/27/2023   BILITOT 0.3 07/25/2022   ALKPHOS 59 08/27/2023   AST 22 08/27/2023   ALT 7 08/27/2023  .  Total Discharge time is about 33 minutes  Colin Dawley M.D on 08/29/2023 at 3:25 PM  Go to [email or web address] -  for contact info  [facility] - Office  [phone]

## 2023-08-29 NOTE — Progress Notes (Signed)
 Pt due to void s/p foley catheter removal. Bladder scan shows . MD notified.

## 2023-08-29 NOTE — Plan of Care (Signed)
 ?  Problem: Coping: ?Goal: Level of anxiety will decrease ?Outcome: Progressing ?  ?Problem: Safety: ?Goal: Ability to remain free from injury will improve ?Outcome: Progressing ?  ?

## 2023-08-29 NOTE — Care Management Important Message (Signed)
 Important Message  Patient Details  Name: Mark Brady MRN: [id number] Date of Birth: 1929-05-17   Important Message Given:  N/A - LOS <3 / Initial given by admissions     Neila Bally 08/29/2023, 10:08 AM

## 2023-08-30 ENCOUNTER — Non-Acute Institutional Stay (SKILLED_NURSING_FACILITY): Payer: Self-pay | Admitting: Adult Health

## 2023-08-30 ENCOUNTER — Encounter: Payer: Self-pay | Admitting: Adult Health

## 2023-08-30 DIAGNOSIS — I700 Atherosclerosis of aorta: Secondary | ICD-10-CM

## 2023-08-30 DIAGNOSIS — I152 Hypertension secondary to endocrine disorders: Secondary | ICD-10-CM

## 2023-08-30 DIAGNOSIS — E1169 Type 2 diabetes mellitus with other specified complication: Secondary | ICD-10-CM

## 2023-08-30 DIAGNOSIS — K219 Gastro-esophageal reflux disease without esophagitis: Secondary | ICD-10-CM | POA: Diagnosis not present

## 2023-08-30 DIAGNOSIS — R338 Other retention of urine: Secondary | ICD-10-CM

## 2023-08-30 DIAGNOSIS — E785 Hyperlipidemia, unspecified: Secondary | ICD-10-CM

## 2023-08-30 DIAGNOSIS — S2241XS Multiple fractures of ribs, right side, sequela: Secondary | ICD-10-CM

## 2023-08-30 DIAGNOSIS — F02A2 Dementia in other diseases classified elsewhere, mild, with psychotic disturbance: Secondary | ICD-10-CM

## 2023-08-30 DIAGNOSIS — E1159 Type 2 diabetes mellitus with other circulatory complications: Secondary | ICD-10-CM | POA: Diagnosis not present

## 2023-08-30 DIAGNOSIS — E538 Deficiency of other specified B group vitamins: Secondary | ICD-10-CM

## 2023-08-30 DIAGNOSIS — G20A1 Parkinson's disease without dyskinesia, without mention of fluctuations: Secondary | ICD-10-CM

## 2023-08-30 DIAGNOSIS — E1165 Type 2 diabetes mellitus with hyperglycemia: Secondary | ICD-10-CM

## 2023-08-30 DIAGNOSIS — H409 Unspecified glaucoma: Secondary | ICD-10-CM

## 2023-08-30 NOTE — Progress Notes (Signed)
 Location:  [facility] Room Number: [id number] Place of Service:  SNF (31)   CODE STATUS: full   Allergies  Allergen Reactions   Penicillins Other (See Comments)    Unknown, no reaction listed on MAR   Aspirin  Rash    No reaction listed on MAR for verification    Chief Complaint  Patient presents with   Hospitalization Follow-up    HPI:  He is a 89 year old man who has been hospitalized from 08-26-23 through 09-08-23. His medical history includes: CVA; hypertension; hyperlipidemia; diabetes; arthritis; gerd; parkinson's disease. He had recently been hospitalized 08-21-23 through 08-23-23 after fall with right side rib fractures. He presented in the ED after another fall. The ct of head and cervical spine did not show acute processes. The ct of abdomen and pelvis:  with bilateral hydronephrosis and hydroureter with distended bladder. His labs demonstrated acute kidney injury. He was given IVF and was admitted to the hospital. He had a foley inserted and failed a voiding trial. He will need to follow up with urology for foley removal and voiding trial. He is here for short term rehab with his goal to return back home. He will continue to be followed for his chronic illnesses including:  Type 2 diabetes mellitus with hyperglycemia without long term current use of insulin :  Hypertension associated with type 2 diabetes mellitus: Hyperlipidemia associated with type 2 diabetes mellitus:    Past Medical History:  Diagnosis Date   Arthritis    Diabetes mellitus without complication (HCC)    diet controlled   History of elevated PSA    Hyperlipidemia    Hypertension    Stroke [facility])     Past Surgical History:  Procedure Laterality Date   CATARACT EXTRACTION W/PHACO Right 01/26/2014   Procedure: CATARACT EXTRACTION PHACO AND INTRAOCULAR LENS PLACEMENT (IOC);  Surgeon: Lavonia Powers T. Gennie Kicks, MD;  Location: [facility];  Service: Ophthalmology;  Laterality: Right;  CDE 6.53   CATARACT  EXTRACTION W/PHACO Left 02/09/2014   Procedure: CATARACT EXTRACTION PHACO AND INTRAOCULAR LENS PLACEMENT (IOC);  Surgeon: Lavonia Powers T. Gennie Kicks, MD;  Location: [facility];  Service: Ophthalmology;  Laterality: Left;  CDE:5.58   COLONOSCOPY     YAG LASER APPLICATION Right 12/07/2014   Procedure: YAG LASER APPLICATION;  Surgeon: Albert Huff, MD;  Location: [facility];  Service: Ophthalmology;  Laterality: Right;    Social History   Socioeconomic History   Marital status: Married    Spouse name: Stana Ear   Number of children: 5   Years of education: Not on file   Highest education level: Not on file  Occupational History   Occupation: retired    Comment: tobacco and beef farmer  Tobacco Use   Smoking status: Never   Smokeless tobacco: Never  Substance and Sexual Activity   Alcohol use: No   Drug use: No   Sexual activity: Yes    Birth control/protection: None  Other Topics Concern   Not on file  Social History Narrative   3children, all live nearby.   Grandchildren and great grandchildren.      Right Handed    Social Drivers of Health   Financial Resource Strain: Low Risk  (05/17/2023)   Overall Financial Resource Strain (CARDIA)    Difficulty of Paying Living Expenses: Not hard at all  Food Insecurity: No Food Insecurity (08/27/2023)   Hunger Vital Sign    Worried About Running Out of Food in the Last Year: Never true  Ran Out of Food in the Last Year: Never true  Transportation Needs: No Transportation Needs (08/27/2023)   PRAPARE - Administrator, Civil Service (Medical): No    Lack of Transportation (Non-Medical): No  Physical Activity: Insufficiently Active (05/17/2023)   Exercise Vital Sign    Days of Exercise per Week: 3 days    Minutes of Exercise per Session: 30 min  Stress: No Stress Concern Present (05/17/2023)   [employer] of Occupational Health - Occupational Stress Questionnaire    Feeling of Stress : Not at all  Social Connections: Moderately Integrated  (08/27/2023)   Social Connection and Isolation Panel [NHANES]    Frequency of Communication with Friends and Family: More than three times a week    Frequency of Social Gatherings with Friends and Family: More than three times a week    Attends Religious Services: More than 4 times per year    Active Member of [employer] or Organizations: No    Attends Banker Meetings: Never    Marital Status: Married  Catering manager Violence: Not At Risk (08/27/2023)   Humiliation, Afraid, Rape, and Kick questionnaire    Fear of Current or Ex-Partner: No    Emotionally Abused: No    Physically Abused: No    Sexually Abused: No   Family History  Problem Relation Age of Onset   Arthritis Other    Colon cancer Child    Heart failure Child       VITAL SIGNS BP (!) 124/55   Pulse 84   Temp 98 F (36.7 C)   Resp 20   Ht 5\' 10"  (1.778 m)   Wt 170 lb (77.1 kg)   SpO2 100%   BMI 24.39 kg/m   Outpatient Encounter Medications as of 08/30/2023  Medication Sig   Accu-Chek Softclix Lancets lancets USE TO CHECK BLOOD SUGAR DAILY   acetaminophen  (TYLENOL ) 500 MG tablet Take 2 tablets (1,000 mg total) by mouth every 8 (eight) hours as needed for mild pain (pain score 1-3) or headache (or Fever >/= 101).   amLODipine  (NORVASC ) 10 MG tablet Take 1 tablet (10 mg total) by mouth daily.   carbidopa -levodopa  (SINEMET  IR) 25-100 MG tablet Take 2 tablets by mouth 3 (three) times daily. 7am/11am/4pm (Patient taking differently: Take 2 tablets by mouth 3 (three) times daily. 6am/11am/4pm)   cyanocobalamin  2000 MCG tablet Take 1,000 mcg by mouth daily.   dorzolamide -timolol  (COSOPT ) 22.3-6.8 MG/ML ophthalmic solution Place 1 drop into both eyes daily.   glucose blood (ACCU-CHEK AVIVA PLUS) test strip USE TO CHECK BLOOD SUGAR DAILY   latanoprost  (XALATAN ) 0.005 % ophthalmic solution Place 1 drop into both eyes at bedtime.   lidocaine  (LIDODERM ) 5 % Place 1 patch onto the skin daily. Remove & Discard patch  within 12 hours; apply to affected area (right sided costal zone)   losartan  (COZAAR ) 100 MG tablet TAKE 1 TABLET(100 MG) BY MOUTH DAILY   omeprazole  (PRILOSEC) 40 MG capsule Take 40 mg by mouth daily.   oxyCODONE  (OXY IR/ROXICODONE ) 5 MG immediate release tablet Take 1 tablet (5 mg total) by mouth every 6 (six) hours as needed for severe pain (pain score 7-10).   polyethylene glycol (MIRALAX / GLYCOLAX) 17 g packet Take 17 g by mouth daily.   pravastatin  (PRAVACHOL ) 40 MG tablet TAKE 1 TABLET(40 MG) BY MOUTH DAILY   senna-docusate (SENOKOT-S) 8.6-50 MG tablet Take 2 tablets by mouth at bedtime.   tamsulosin (FLOMAX) 0.4 MG CAPS capsule Take  1 capsule (0.4 mg total) by mouth daily after supper.   No facility-administered encounter medications on file as of 08/30/2023.     SIGNIFICANT DIAGNOSTIC EXAMS  TODAY  07-25-23: hgb A1c 6.7; chol 173; ldl 91; trig 99; hdl 64 08-21-23: wbc 12.8; hgb 14.1; hct 40.8; mcv 96.5 plt 371; glucose 140; bun 22; creat 0.91; k+ 4.0; na++ 136; ca 9.2 gfr >60; protein 9.2 albumin 7.5; tsh 4.136; vitamin B 12: 196 08-27-23: wbc 14.1; hgb 11.9; hct 35.2; mcv 94.9 plt 244; glucose 152; bun 68; creat 2.12; k+ 3.8; na++ 131; ca 7.9; gfr 29 protein 6.8; albumin 3.0 08-28-23: wbc 9.4; hgb 11.8; hct 33.0; mcv 94.0 plt 259; glucose 119; bun 34; creat 1.08; k+ 3.4; na++ 137; ca 7.9; gfr >60   Review of Systems  Constitutional:  Negative for malaise/fatigue.  Respiratory:  Negative for cough and shortness of breath.   Cardiovascular:  Negative for chest pain, palpitations and leg swelling.  Gastrointestinal:  Negative for abdominal pain, constipation and heartburn.  Musculoskeletal:  Negative for back pain, joint pain and myalgias.  Skin: Negative.   Neurological:  Negative for dizziness.  Psychiatric/Behavioral:  The patient is not nervous/anxious.    Physical Exam Constitutional:      General: He is not in acute distress.    Appearance: He is well-developed. He is not  diaphoretic.  Neck:     Thyroid : No thyromegaly.  Cardiovascular:     Rate and Rhythm: Normal rate and regular rhythm.     Heart sounds: Normal heart sounds.  Pulmonary:     Effort: Pulmonary effort is normal. No respiratory distress.     Breath sounds: Normal breath sounds.  Abdominal:     General: Bowel sounds are normal. There is distension.     Tenderness: There is no abdominal tenderness.     Comments: firm  Genitourinary:    Comments: foley Musculoskeletal:     Cervical back: Neck supple.     Right lower leg: No edema.     Left lower leg: No edema.     Comments: Limited range of motion to shoulders   Lymphadenopathy:     Cervical: No cervical adenopathy.  Skin:    General: Skin is warm and dry.  Neurological:     Mental Status: He is alert. Mental status is at baseline.  Psychiatric:        Mood and Affect: Mood normal.     ASSESSMENT/ PLAN:  TODAY  Closed fracture of multiple ribs on right side sequela: will continue lidoderm  patch to area daily; will continue oxycodone  5 mg every 6 hours as needed; has tylenol  1 gm every 8 hours as needed;   2. Type 2 diabetes mellitus with hyperglycemia without long term current use of insulin : hgb A1c 6.7; is diet controlled is on statin and arb  3. Hypertension associated with type 2 diabetes mellitus: b/p 124/55: will continue norvasc  10 mg daily and losartan  100 mg daily   4. Hyperlipidemia associated with type 2 diabetes mellitus: ldl 91; will continue pravachol  40 mg daily   5. Parkinson's disease unspecified whether dyskinesia present; unspecified whether  manifestations fluctuate: will continue sinemet  25/100 2 tabs three times daily   6. 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   7. Glaucoma of both eyes unspecified type: will continue cosopt  2-0.5 % to both eyes daily and latanoprost  to both eyes nightly   8. Gastroesophageal  reflux disease without esophagitis: will continue prilosec 40 mg daily   9.  Vitamin B 12 deficiency: level 196 will continue 2000 mcg daily   10. Aortic atherosclerosis: (ct 08-21-23) is on statin   11. Urinary retention: has foley; will continue flomax 0.4 mg daily and will follow up with urology   12. Chronic constipation: will continue senna s 2 tabs nightly and miralax daily   13. Chronic anemia: hgb 11.8 will monitor   Will check cbc; bmp   Britt Candle NP [facility]   call [phone]

## 2023-09-02 ENCOUNTER — Non-Acute Institutional Stay (SKILLED_NURSING_FACILITY): Payer: Self-pay | Admitting: Internal Medicine

## 2023-09-02 ENCOUNTER — Encounter: Payer: Self-pay | Admitting: Internal Medicine

## 2023-09-02 DIAGNOSIS — F02C2 Dementia in other diseases classified elsewhere, severe, with psychotic disturbance: Secondary | ICD-10-CM

## 2023-09-02 DIAGNOSIS — E1351 Other specified diabetes mellitus with diabetic peripheral angiopathy without gangrene: Secondary | ICD-10-CM

## 2023-09-02 DIAGNOSIS — R338 Other retention of urine: Secondary | ICD-10-CM

## 2023-09-02 DIAGNOSIS — G20A1 Parkinson's disease without dyskinesia, without mention of fluctuations: Secondary | ICD-10-CM | POA: Diagnosis not present

## 2023-09-02 NOTE — Progress Notes (Signed)
 NURSING HOME LOCATION:  [facility] ROOM NUMBER:  [id number]  CODE STATUS:  Full Code  PCP: Cook, Jayce G, DO   This is a nursing facility follow up visit for Nursing Facility readmission within 30 days.  Interim medical record and care since last SNF visit was updated with review of diagnostic studies and change in clinical status since last visit were documented.  HPI: He was originally hospitalized 5/7 - 08/23/2023 after sustaining right-sided rib fractures in a fall.  According to his wife she had heard the fall in the bedroom where he sleeps in a hospital bed.  She states he has frequent falls, possibly 15-18 over several months.  He refuses to use a cane, rollator, or walker.  The falls have increased over the past year but he is actually been falling for almost 2 years.  This is in the context of Parkinson's complicated by dementia for which he sees Dr. Winferd Hatter , Neurology.  CT scanning of the head on 5/7 revealed chronic infarcts in the left corona radiata/basal ganglia.  He had moderate to advanced patchy and ill-defined hypoattenuation within the cerebral white matter compatible with small vessel ischemic disease.  Imaging revealed right-sided rib fractures of T3 10, 11, and 12.  Physical therapy consulted and recommended short-term rehab and he was discharged to this facility on 5/9. He was readmitted 5/12 - 08/29/2023 after an unwitnessed fall here at the SNF.  He had sustained an abrasion to the left side of the head.  CT of the head and C-spine were negative for acute abnormalities.  CT of the chest revealed that the rib fractures were stable; atelectasis with associated small effusion was documented.  CT of the abdomen/pelvis revealed bilateral hydronephrosis and hydroureteronephrosis with distended bladder. Labs revealed mild hyponatremia with a sodium of 131, mild hyperglycemia at 152, and AKI with prerenal component. Specifically creatinine was 2.12, GFR  and BUN 68.  He  received 1 L of lactated Ringer 's with maintenance LR as well. Leukocytosis was present with a value of 14,100; this was attributed to stress demargination in the setting of recurrent falls.   Foley catheter was placed on 08/27/2023 for acute urinary retention.  Estimated GFR on that date was 29 with a creatinine of 2.12.  Subsequently eGFR returned to baseline of greater than 60 and creatinine was 1.08, essentially baseline.  Patient failed voiding trial on 5/15 and the Foley catheter was reinserted.  Outpatient follow-up with Dr. Claretta Croft, Urology was to be scheduled. He was stabilized and returned to SNF for rehab. These findings are in the context history of elevated PSA, dyslipidemia, essential hypertension, and history of stroke.  Review of systems: Dementia invalidated responses.He is oriented only to self. Responses are garbled & non sensical.  When he first saw me he stated "seeing you around for several years." When I asked what the doctors at the hospital had diagnosed;his response was "they do not mind their own business."  When he noted the abrasion over the dorsum of the left hand ,he made a comment "the boys dragged me through the field" and then began to cry.   Physical exam:  Pertinent or positive findings: He appears his age.  He has a grimacing type countenance.  His mouth stays agape.  He has pattern alopecia.  He is also unshaven.  The right pupil is asymmetrically enlarged.  According to his wife this happened during cataract surgery.  Heart sounds are distant.  Breath sounds are decreased.  Abdomen is protuberant.  The abdomen is soft with normal bowel sounds.  Apparently he did have some abdominal distention over the weekend and portable film of the abdomen was performed suggesting ileus.  Clinically there is no ileus at this time.  Foley catheter in place.  He has 1/2+ edema at the sock line.  Dorsalis pedis pulses are stronger than posterior tibial pulses.  He has abrasion of the  dorsum of the left hand.  He has irregular ecchymoses of the dorsum of the hands.  He has isolated DIP and PIP arthritic changes.  General appearance:  no acute distress, increased work of breathing is present.   Lymphatic: No lymphadenopathy about the head, neck, axilla. Eyes: No conjunctival inflammation or lid edema is present. There is no scleral icterus. Ears:  External ear exam shows no significant lesions or deformities.   Nose:  External nasal examination shows no deformity or inflammation. Nasal mucosa are pink and moist without lesions, exudates Neck:  No thyromegaly, masses, tenderness noted.    Heart:  No gallop, murmur, click, rub .  Lungs:  without wheezes, rhonchi, rales, rubs. Abdomen:  with no organomegaly, hernias, masses. GU: Deferred  Extremities:  No cyanosis, clubbing  Neurologic exam :Balance, Rhomberg, finger to nose testing could not be completed due to clinical state Skin: Warm & dry w/o tenting. No significant rash.  See summary under each active problem in the Problem List with associated updated therapeutic plan

## 2023-09-02 NOTE — Assessment & Plan Note (Addendum)
 At this time he is oriented to self  only , unable provide any history and having difficulty following simple commands.  According to his wife he attempts to ambulate without help or use of cane, rollator, or walker.  This has been associated with recurrent falls which resulted in the rib fractures prompting the first admission 5/70-08/23/2023.   PT/OT as dementia will allow.

## 2023-09-02 NOTE — Assessment & Plan Note (Signed)
 Urology follow-up with Dr. Claretta Croft to be scheduled.

## 2023-09-02 NOTE — Patient Instructions (Addendum)
 See assessment and plan under each diagnosis in the problem list and acutely for this visit:  Acute urinary retention Urology follow-up with Dr. Claretta Croft to be scheduled.  Parkinson's disease dementia (HCC) At this time he is oriented to self  only , unable provide any history and having difficulty following simple commands.  According to his wife he attempts to ambulate without help or use of cane, rollator, or walker.  This has been associated with recurrent falls which resulted in the rib fractures prompting the first admission 5/70-08/23/2023.   PT/OT as dementia will allow.   DM (diabetes mellitus), secondary, with peripheral vascular complications (HCC) While hospitalized 5/12 - 08/29/2023 glucoses ranged from a low of 83 up to 153.A1c 07/25/23 indicates excellent control with a value of 6.7%.  No change indicated.

## 2023-09-02 NOTE — Assessment & Plan Note (Addendum)
 While hospitalized 5/12 - 08/29/2023 glucoses ranged from a low of 83 up to 153.A1c 07/25/23 indicates excellent control with a value of 6.7%.  No change indicated.

## 2023-09-06 ENCOUNTER — Emergency Department (HOSPITAL_COMMUNITY)

## 2023-09-06 ENCOUNTER — Encounter (HOSPITAL_COMMUNITY): Payer: Self-pay

## 2023-09-06 ENCOUNTER — Inpatient Hospital Stay (HOSPITAL_COMMUNITY)
Admission: EM | Admit: 2023-09-06 | Discharge: 2023-09-12 | DRG: 853 | Disposition: A | Discharge status: Skilled Nursing Facility | Source: Other Acute Inpatient Hospital | Attending: Internal Medicine | Admitting: Internal Medicine

## 2023-09-06 ENCOUNTER — Ambulatory Visit: Payer: Self-pay

## 2023-09-06 DIAGNOSIS — Z886 Allergy status to analgesic agent status: Secondary | ICD-10-CM | POA: Diagnosis not present

## 2023-09-06 DIAGNOSIS — J9601 Acute respiratory failure with hypoxia: Secondary | ICD-10-CM | POA: Diagnosis present

## 2023-09-06 DIAGNOSIS — R6520 Severe sepsis without septic shock: Secondary | ICD-10-CM | POA: Diagnosis present

## 2023-09-06 DIAGNOSIS — Z79899 Other long term (current) drug therapy: Secondary | ICD-10-CM

## 2023-09-06 DIAGNOSIS — J690 Pneumonitis due to inhalation of food and vomit: Secondary | ICD-10-CM | POA: Diagnosis present

## 2023-09-06 DIAGNOSIS — A419 Sepsis, unspecified organism: Principal | ICD-10-CM | POA: Diagnosis present

## 2023-09-06 DIAGNOSIS — E871 Hypo-osmolality and hyponatremia: Secondary | ICD-10-CM | POA: Diagnosis present

## 2023-09-06 DIAGNOSIS — G9341 Metabolic encephalopathy: Secondary | ICD-10-CM | POA: Diagnosis present

## 2023-09-06 DIAGNOSIS — Z8673 Personal history of transient ischemic attack (TIA), and cerebral infarction without residual deficits: Secondary | ICD-10-CM | POA: Diagnosis not present

## 2023-09-06 DIAGNOSIS — R296 Repeated falls: Secondary | ICD-10-CM | POA: Diagnosis present

## 2023-09-06 DIAGNOSIS — N401 Enlarged prostate with lower urinary tract symptoms: Secondary | ICD-10-CM | POA: Diagnosis present

## 2023-09-06 DIAGNOSIS — I10 Essential (primary) hypertension: Secondary | ICD-10-CM | POA: Diagnosis present

## 2023-09-06 DIAGNOSIS — Y828 Other medical devices associated with adverse incidents: Secondary | ICD-10-CM | POA: Diagnosis present

## 2023-09-06 DIAGNOSIS — F05 Delirium due to known physiological condition: Secondary | ICD-10-CM | POA: Diagnosis not present

## 2023-09-06 DIAGNOSIS — T83021A Displacement of indwelling urethral catheter, initial encounter: Secondary | ICD-10-CM | POA: Diagnosis present

## 2023-09-06 DIAGNOSIS — Z7189 Other specified counseling: Secondary | ICD-10-CM | POA: Diagnosis not present

## 2023-09-06 DIAGNOSIS — Z880 Allergy status to penicillin: Secondary | ICD-10-CM

## 2023-09-06 DIAGNOSIS — Z800 Family history of malignant neoplasm of digestive organs: Secondary | ICD-10-CM | POA: Diagnosis not present

## 2023-09-06 DIAGNOSIS — Z8249 Family history of ischemic heart disease and other diseases of the circulatory system: Secondary | ICD-10-CM

## 2023-09-06 DIAGNOSIS — E1169 Type 2 diabetes mellitus with other specified complication: Secondary | ICD-10-CM | POA: Diagnosis present

## 2023-09-06 DIAGNOSIS — Z66 Do not resuscitate: Secondary | ICD-10-CM | POA: Diagnosis present

## 2023-09-06 DIAGNOSIS — E785 Hyperlipidemia, unspecified: Secondary | ICD-10-CM | POA: Diagnosis present

## 2023-09-06 DIAGNOSIS — R0902 Hypoxemia: Secondary | ICD-10-CM

## 2023-09-06 DIAGNOSIS — S2241XA Multiple fractures of ribs, right side, initial encounter for closed fracture: Secondary | ICD-10-CM | POA: Diagnosis present

## 2023-09-06 DIAGNOSIS — Z515 Encounter for palliative care: Secondary | ICD-10-CM

## 2023-09-06 DIAGNOSIS — G20A1 Parkinson's disease without dyskinesia, without mention of fluctuations: Secondary | ICD-10-CM | POA: Diagnosis present

## 2023-09-06 DIAGNOSIS — E876 Hypokalemia: Secondary | ICD-10-CM | POA: Diagnosis present

## 2023-09-06 DIAGNOSIS — N400 Enlarged prostate without lower urinary tract symptoms: Secondary | ICD-10-CM

## 2023-09-06 DIAGNOSIS — T17908A Unspecified foreign body in respiratory tract, part unspecified causing other injury, initial encounter: Principal | ICD-10-CM

## 2023-09-06 DIAGNOSIS — F0282 Dementia in other diseases classified elsewhere, unspecified severity, with psychotic disturbance: Secondary | ICD-10-CM | POA: Diagnosis present

## 2023-09-06 DIAGNOSIS — J9600 Acute respiratory failure, unspecified whether with hypoxia or hypercapnia: Secondary | ICD-10-CM | POA: Diagnosis present

## 2023-09-06 DIAGNOSIS — R339 Retention of urine, unspecified: Secondary | ICD-10-CM | POA: Diagnosis present

## 2023-09-06 DIAGNOSIS — S2241XS Multiple fractures of ribs, right side, sequela: Secondary | ICD-10-CM | POA: Diagnosis not present

## 2023-09-06 DIAGNOSIS — J189 Pneumonia, unspecified organism: Secondary | ICD-10-CM | POA: Diagnosis present

## 2023-09-06 DIAGNOSIS — S2249XA Multiple fractures of ribs, unspecified side, initial encounter for closed fracture: Secondary | ICD-10-CM | POA: Diagnosis present

## 2023-09-06 LAB — CBC WITH DIFFERENTIAL/PLATELET
Abs Immature Granulocytes: 0.45 10*3/uL — ABNORMAL HIGH (ref 0.00–0.07)
Basophils Absolute: 0.1 10*3/uL (ref 0.0–0.1)
Basophils Relative: 0 %
Eosinophils Absolute: 0.1 10*3/uL (ref 0.0–0.5)
Eosinophils Relative: 0 %
HCT: 35.7 % — ABNORMAL LOW (ref 39.0–52.0)
Hemoglobin: 12 g/dL — ABNORMAL LOW (ref 13.0–17.0)
Immature Granulocytes: 2 %
Lymphocytes Relative: 3 %
Lymphs Abs: 0.8 10*3/uL (ref 0.7–4.0)
MCH: 32.6 pg (ref 26.0–34.0)
MCHC: 33.6 g/dL (ref 30.0–36.0)
MCV: 97 fL (ref 80.0–100.0)
Monocytes Absolute: 1.7 10*3/uL — ABNORMAL HIGH (ref 0.1–1.0)
Monocytes Relative: 6 %
Neutro Abs: 26.9 10*3/uL — ABNORMAL HIGH (ref 1.7–7.7)
Neutrophils Relative %: 89 %
Platelets: 437 10*3/uL — ABNORMAL HIGH (ref 150–400)
RBC: 3.68 MIL/uL — ABNORMAL LOW (ref 4.22–5.81)
RDW: 12.6 % (ref 11.5–15.5)
WBC: 30 10*3/uL — ABNORMAL HIGH (ref 4.0–10.5)
nRBC: 0 % (ref 0.0–0.2)

## 2023-09-06 LAB — COMPREHENSIVE METABOLIC PANEL WITH GFR
ALT: 8 U/L (ref 0–44)
AST: 54 U/L — ABNORMAL HIGH (ref 15–41)
Albumin: 2.9 g/dL — ABNORMAL LOW (ref 3.5–5.0)
Alkaline Phosphatase: 72 U/L (ref 38–126)
Anion gap: 10 (ref 5–15)
BUN: 27 mg/dL — ABNORMAL HIGH (ref 8–23)
CO2: 22 mmol/L (ref 22–32)
Calcium: 8 mg/dL — ABNORMAL LOW (ref 8.9–10.3)
Chloride: 100 mmol/L (ref 98–111)
Creatinine, Ser: 1.06 mg/dL (ref 0.61–1.24)
GFR, Estimated: >60 mL/min (ref >60)
Glucose, Bld: 185 mg/dL — ABNORMAL HIGH (ref 70–99)
Potassium: 2.6 mmol/L — CL (ref 3.5–5.1)
Sodium: 132 mmol/L — ABNORMAL LOW (ref 135–145)
Total Bilirubin: 1.4 mg/dL — ABNORMAL HIGH (ref 0.0–1.2)
Total Protein: 6.3 g/dL — ABNORMAL LOW (ref 6.5–8.1)

## 2023-09-06 LAB — TROPONIN I (HIGH SENSITIVITY)
Troponin I (High Sensitivity): 18 ng/L — ABNORMAL HIGH (ref <18)
Troponin I (High Sensitivity): 15 ng/L (ref <18)

## 2023-09-06 LAB — URINALYSIS, ROUTINE W REFLEX MICROSCOPIC
Bilirubin Urine: NEGATIVE
Glucose, UA: NEGATIVE mg/dL
Ketones, ur: 5 mg/dL — AB
Nitrite: NEGATIVE
Protein, ur: 100 mg/dL — AB
Specific Gravity, Urine: 1.024 (ref 1.005–1.030)
WBC, UA: >50 WBC/hpf (ref 0–5)
pH: 5 (ref 5.0–8.0)

## 2023-09-06 LAB — BRAIN NATRIURETIC PEPTIDE: B Natriuretic Peptide: 90 pg/mL (ref 0.0–100.0)

## 2023-09-06 LAB — LACTIC ACID, PLASMA: Lactic Acid, Venous: 2.4 mmol/L (ref 0.5–1.9)

## 2023-09-06 MED ORDER — POTASSIUM CHLORIDE 20 MEQ PO PACK: 40.0000 meq | PACK | Freq: Once | ORAL | Status: DC

## 2023-09-06 MED ORDER — SODIUM CHLORIDE 0.9 % IV SOLN
500.0000 mg | INTRAVENOUS | Status: DC
  Administered 2023-09-06: 500 mg via INTRAVENOUS
  Filled 2023-09-06: qty 5

## 2023-09-06 MED ORDER — SODIUM CHLORIDE 0.9 % IV SOLN
2.0000 g | Freq: Once | INTRAVENOUS | Status: AC
  Administered 2023-09-06: 2 g via INTRAVENOUS
  Filled 2023-09-06: qty 20

## 2023-09-06 MED ORDER — POTASSIUM CHLORIDE 10 MEQ/100ML IV SOLN
10.0000 meq | Freq: Once | INTRAVENOUS | Status: AC
  Administered 2023-09-06: 10 meq via INTRAVENOUS
  Filled 2023-09-06: qty 100

## 2023-09-06 MED ORDER — IOHEXOL 350 MG/ML SOLN
100.0000 mL | Freq: Once | INTRAVENOUS | Status: AC | PRN
  Administered 2023-09-06: 100 mL via INTRAVENOUS

## 2023-09-06 NOTE — H&P (Signed)
 History and Physical    PatientKIETH Brady KGM:[id number] DOB: 1929/12/02 DOA: 09/06/2023 DOS: the patient was seen and examined on 09/06/2023 PCP: Cook, Jayce G, DO  Patient coming from: SNF  Chief Complaint:  Chief Complaint  Patient presents with   Aspiration   HPI: Mark Brady is a 89 y.o. male with medical history significant of T2DM, hypertension, hyperlipidemia and advanced dementia who presented with respiratory distress.  Recent hospitalization 05/12 to 08/29/23 after a mechanical fall, resulting in rib fractures. He was discharged to SNF with a foley catheter due to urinary retention.  Most information is from his wife at the bedside, patient is not communicative.  Apparently patient loss his consciousness after aspirating a milkshake. EMS was called and he got about 10 ml of white milkshake cleared from his upper airway. Patient recovered his consciousness and was transported to the ED. His 02 saturation was 62% and required 15 L per non re breather mask to increase to 92%.   At the time of my examination patient is not verbal, he has mittens in place and appears in pain.  His foley was repositioned after patient pulling the tubing.   Per his wife patient's dementia has been progressively worsening since his initial hospitalization on 08/21/23 and posterior transfer to SNF.  He has been having hallucinations and has been fully dependent for his daily activities of daily living.     Review of Systems: unable to review all systems due to the inability of the patient to answer questions. Past Medical History:  Diagnosis Date   Arthritis    Diabetes mellitus without complication (HCC)    diet controlled   History of elevated PSA    Hyperlipidemia    Hypertension    Stroke [facility])    Past Surgical History:  Procedure Laterality Date   CATARACT EXTRACTION W/PHACO Right 01/26/2014   Procedure: CATARACT EXTRACTION PHACO AND INTRAOCULAR LENS PLACEMENT (IOC);   Surgeon: Lavonia Powers T. Gennie Kicks, MD;  Location: [facility];  Service: Ophthalmology;  Laterality: Right;  CDE 6.53   CATARACT EXTRACTION W/PHACO Left 02/09/2014   Procedure: CATARACT EXTRACTION PHACO AND INTRAOCULAR LENS PLACEMENT (IOC);  Surgeon: Lavonia Powers T. Gennie Kicks, MD;  Location: [facility];  Service: Ophthalmology;  Laterality: Left;  CDE:5.58   COLONOSCOPY     YAG LASER APPLICATION Right 12/07/2014   Procedure: YAG LASER APPLICATION;  Surgeon: Albert Huff, MD;  Location: [facility];  Service: Ophthalmology;  Laterality: Right;   Social History:  reports that he has never smoked. He has never used smokeless tobacco. He reports that he does not drink alcohol and does not use drugs.  Allergies  Allergen Reactions   Penicillins Other (See Comments)    Unknown, no reaction listed on MAR   Aspirin  Rash    No reaction listed on MAR for verification    Family History  Problem Relation Age of Onset   Arthritis Other    Colon cancer Child    Heart failure Child     Prior to Admission medications   Medication Sig Start Date End Date Taking? Authorizing Provider  Accu-Chek Softclix Lancets lancets USE TO CHECK BLOOD SUGAR DAILY 01/07/23   Cook, Jayce G, DO  acetaminophen  (TYLENOL ) 500 MG tablet Take 2 tablets (1,000 mg total) by mouth every 8 (eight) hours as needed for mild pain (pain score 1-3) or headache (or Fever >/= 101). 08/23/23   Justina Oman, MD  amLODipine  (NORVASC ) 10 MG tablet Take 1 tablet (10 mg total) by  mouth daily. 07/29/23   Cook, Jayce G, DO  carbidopa -levodopa  (SINEMET  IR) 25-100 MG tablet Take 2 tablets by mouth 3 (three) times daily. 7am/11am/4pm Patient taking differently: Take 2 tablets by mouth 3 (three) times daily. 6am/11am/4pm 07/09/23   Tat, Von Grumbling, DO  cyanocobalamin  2000 MCG tablet Take 1,000 mcg by mouth daily.    [provider]  dorzolamide -timolol  (COSOPT ) 22.3-6.8 MG/ML ophthalmic solution Place 1 drop into both eyes daily.    [provider]  glucose blood  (ACCU-CHEK AVIVA PLUS) test strip USE TO CHECK BLOOD SUGAR DAILY 05/24/22   Cook, Jayce G, DO  latanoprost  (XALATAN ) 0.005 % ophthalmic solution Place 1 drop into both eyes at bedtime. 04/30/22   [provider]  lidocaine  (LIDODERM ) 5 % Place 1 patch onto the skin daily. Remove & Discard patch within 12 hours; apply to affected area (right sided costal zone) 08/24/23   Justina Oman, MD  losartan  (COZAAR ) 100 MG tablet TAKE 1 TABLET(100 MG) BY MOUTH DAILY 06/26/23   Cook, Jayce G, DO  omeprazole  (PRILOSEC) 40 MG capsule Take 40 mg by mouth daily.    [provider]  oxyCODONE  (OXY IR/ROXICODONE ) 5 MG immediate release tablet Take 1 tablet (5 mg total) by mouth every 6 (six) hours as needed for severe pain (pain score 7-10). 08/29/23   Colin Dawley, MD  polyethylene glycol (MIRALAX  / GLYCOLAX ) 17 g packet Take 17 g by mouth daily. 08/30/23   Colin Dawley, MD  pravastatin  (PRAVACHOL ) 40 MG tablet TAKE 1 TABLET(40 MG) BY MOUTH DAILY 06/26/23   Cook, Jayce G, DO  senna-docusate (SENOKOT-S) 8.6-50 MG tablet Take 2 tablets by mouth at bedtime. 08/29/23   Colin Dawley, MD  tamsulosin  (FLOMAX ) 0.4 MG CAPS capsule Take 1 capsule (0.4 mg total) by mouth daily after supper. 08/29/23   Colin Dawley, MD    Physical Exam: Vitals:   09/06/23 2100 09/06/23 2145 09/06/23 2200 09/06/23 2245  BP: (!) 104/54 131/63 121/79 93/79  Pulse: 86 61 95 93  Resp: (!) 23 19 (!) 22 17  Temp:      TempSrc:      SpO2: 98% 98% 97% 96%  Weight:      Height:       Neurology awake, does note follow commands or responds to questions, has bilateral mittens in place, ill looking appearing and in possible pain, his granddaughters and wife are at the bedside  ENT with positive pallor with no icterus, oral mucosa dry Cardiovascular with S1 and S2 present and regular with no gallops, rubs or murmurs Respiratory with bilateral rhonchi with no wheezing or rales, poor inspiratory effort and positive use of  accessory respiratory muscles.  Abdomen with no distention, non tender and soft No lower extremity edema   Data Reviewed:   Na 132, K 2.6 CL 100 bicarbonate 22, glucose 185 bun 27 cr 1,0  AST 54 ALT 8  BNP 90  High sensitive troponin 18 and 15  Lactic acid 2,4  Wbc 30, hgb 12. Plt 437   Urine analysis SG 1,024, protein 100, large leukocytes, moderate hgb., > 50 wbc and 11-20 rbc   Chest radiograph with hyperinflation with no cardiomegaly, positive interstitial infiltrate at the right base.  CT chest abdomen and pelvis, with no central pulmonary embolism, positive consolidation, right lower lobe and small left lower lobe, trace right pleural effusion.  Foley catheter balloon is malpositioned with balloon in the prostatic urethra.  Prostatomegaly   EKG 95 bpm, normal axis, qtc  559, sinus rhythm with no significant ST segment or T wave changes. Positive noisy baseline   Assessment and Plan: * Aspiration pneumonia of right lower lobe (HCC) Complicated with severe sepsis present on admission.   Plan to continue aspiration precautions  IV antibiotic therapy with ceftriaxone (reported allergic to penicillin)  Hold on systemic steroids  IV fluids  Follow up cultures, cell count and temperature curve.  Poor prognosis considering his advanced dementia and parkinson's disease. Consult speech therapy    Hypokalemia Hyponatremia.   Plan to continue IV fluids with isotonic saline  Continue K correction with IV Kcl.  Follow up renal function and electrolytes in am.  Poor oral intake, poor prognosis   Essential hypertension, benign Hold on amlodipine  and losartan  due to risk of hypotension.  Continue blood pressure monitoring   Type 2 diabetes mellitus with hyperlipidemia (HCC) Continue close glucose cover and monitoring with insulin  sliding scale Continue statin therapy   Parkinson's disease (HCC) Advanced dementia. Acute metabolic encephalopathy.   Continue neuro checks per  unit protocol.  Continue mittens to protect lines and catheters  Plan to continue Sinemet , follow up with speech therapy, Pt and Ot He had multiple falls in the recent past.  Poor prognosis.   Rib fractures Multiple fractures and falls.  Plan to continue pain control with IV morphine  He has a poor prognosis   BPH (benign prostatic hyperplasia) Continue foley catheter in place, it has been repositioned in the ED.  Positive foley trauma Positive pyuria, will continue to follow up cultures, not clear significance of urinary wbc in the setting chronic foley catheter.  Advance Care Planning:   Code Status: Limited: Do not attempt resuscitation (DNR) -DNR-LIMITED -Do Not Intubate/DNI    Consults: none   Family Communication: I spoke with patient's wife at the bedside, we talked in detail about patient's condition, plan of care and prognosis and all questions were addressed.   Severity of Illness: The appropriate patient status for this patient is INPATIENT. Inpatient status is judged to be reasonable and necessary in order to provide the required intensity of service to ensure the patient's safety. The patient's presenting symptoms, physical exam findings, and initial radiographic and laboratory data in the context of their chronic comorbidities is felt to place them at high risk for further clinical deterioration. Furthermore, it is not anticipated that the patient will be medically stable for discharge from the hospital within 2 midnights of admission.   * I certify that at the point of admission it is my clinical judgment that the patient will require inpatient hospital care spanning beyond 2 midnights from the point of admission due to high intensity of service, high risk for further deterioration and high frequency of surveillance required.*  Author: Albertus Alt, MD 09/06/2023 11:03 PM  For on call review [email or web address].

## 2023-09-06 NOTE — ED Notes (Signed)
Admitting doc at bedside

## 2023-09-06 NOTE — ED Provider Notes (Signed)
 [place] EMERGENCY DEPARTMENT AT [facility] Provider Note  CSN: [id number] Arrival date & time: 09/06/23 1826  Chief Complaint(s) Aspiration  HPI Mark Brady is a 89 y.o. male with PMH HTN, HLD, T2DM, advanced dementia, recent hospitalization on 08/26/2023 for rib fractures and urinary outflow obstruction secondary to BPH with chronic indwelling Foley recently discharged to SNF who presents emergency room for evaluation of an episode of unresponsiveness.  Patient reportedly had his diet moved to thickened liquids this morning and was drinking a milkshake when he was found unresponsive by nursing staff and EMS was called.  Patient was reportedly gray and hypotensive, minimally responsive for EMS and copious secretions were suctioned out of the airway.  His mental status had started to improve at that time and was transferred to emergency room for further evaluation.  Patient arrives on a nonrebreather given hypoxia in the field.  Additional history unable to be obtained as patient is currently demented.   Past Medical History Past Medical History:  Diagnosis Date   Arthritis    Diabetes mellitus without complication (HCC)    diet controlled   History of elevated PSA    Hyperlipidemia    Hypertension    Stroke [facility])    Patient Active Problem List   Diagnosis Date Noted   AKI (acute kidney injury) (HCC) 08/27/2023   Acute urinary retention 08/27/2023   Hyponatremia 08/27/2023   Hypokalemia 08/27/2023   Hypertension associated with type 2 diabetes mellitus (HCC) 08/26/2023   Hyperlipidemia associated with type 2 diabetes mellitus (HCC) 08/26/2023   Vitamin B 12 deficiency 08/26/2023   Aortic atherosclerosis (HCC) 08/26/2023   History of Parkinson's disease 08/23/2023   Rib fractures 08/21/2023   Parkinson's disease dementia (HCC) 04/23/2023   Parkinson's disease (HCC) 07/24/2021   Sialorrhea 05/14/2020   Gastroesophageal reflux disease without esophagitis  05/13/2020   Glaucoma 11/23/2019   DM (diabetes mellitus), secondary, with peripheral vascular complications (HCC) 10/19/2012   Essential hypertension, benign 09/28/2012   Hyperlipidemia 09/28/2012   Home Medication(s) Prior to Admission medications   Medication Sig Start Date End Date Taking? Authorizing Provider  Accu-Chek Softclix Lancets lancets USE TO CHECK BLOOD SUGAR DAILY 01/07/23   Cook, Jayce G, DO  acetaminophen  (TYLENOL ) 500 MG tablet Take 2 tablets (1,000 mg total) by mouth every 8 (eight) hours as needed for mild pain (pain score 1-3) or headache (or Fever >/= 101). 08/23/23   Justina Oman, MD  amLODipine  (NORVASC ) 10 MG tablet Take 1 tablet (10 mg total) by mouth daily. 07/29/23   Cook, Jayce G, DO  carbidopa -levodopa  (SINEMET  IR) 25-100 MG tablet Take 2 tablets by mouth 3 (three) times daily. 7am/11am/4pm Patient taking differently: Take 2 tablets by mouth 3 (three) times daily. 6am/11am/4pm 07/09/23   Tat, Von Grumbling, DO  cyanocobalamin  2000 MCG tablet Take 1,000 mcg by mouth daily.    [provider]  dorzolamide -timolol  (COSOPT ) 22.3-6.8 MG/ML ophthalmic solution Place 1 drop into both eyes daily.    [provider]  glucose blood (ACCU-CHEK AVIVA PLUS) test strip USE TO CHECK BLOOD SUGAR DAILY 05/24/22   Cook, Jayce G, DO  latanoprost  (XALATAN ) 0.005 % ophthalmic solution Place 1 drop into both eyes at bedtime. 04/30/22   [provider]  lidocaine  (LIDODERM ) 5 % Place 1 patch onto the skin daily. Remove & Discard patch within 12 hours; apply to affected area (right sided costal zone) 08/24/23   Justina Oman, MD  losartan  (COZAAR ) 100 MG tablet TAKE 1 TABLET(100 MG)  BY MOUTH DAILY 06/26/23   Cook, Jayce G, DO  omeprazole  (PRILOSEC) 40 MG capsule Take 40 mg by mouth daily.    [provider]  oxyCODONE  (OXY IR/ROXICODONE ) 5 MG immediate release tablet Take 1 tablet (5 mg total) by mouth every 6 (six) hours as needed for severe pain (pain score  7-10). 08/29/23   Colin Dawley, MD  polyethylene glycol (MIRALAX  / GLYCOLAX ) 17 g packet Take 17 g by mouth daily. 08/30/23   Colin Dawley, MD  pravastatin  (PRAVACHOL ) 40 MG tablet TAKE 1 TABLET(40 MG) BY MOUTH DAILY 06/26/23   Cook, Jayce G, DO  senna-docusate (SENOKOT-S) 8.6-50 MG tablet Take 2 tablets by mouth at bedtime. 08/29/23   Colin Dawley, MD  tamsulosin  (FLOMAX ) 0.4 MG CAPS capsule Take 1 capsule (0.4 mg total) by mouth daily after supper. 08/29/23   Colin Dawley, MD                                                                                                                                    Past Surgical History Past Surgical History:  Procedure Laterality Date   CATARACT EXTRACTION W/PHACO Right 01/26/2014   Procedure: CATARACT EXTRACTION PHACO AND INTRAOCULAR LENS PLACEMENT (IOC);  Surgeon: Lavonia Powers T. Gennie Kicks, MD;  Location: [facility];  Service: Ophthalmology;  Laterality: Right;  CDE 6.53   CATARACT EXTRACTION W/PHACO Left 02/09/2014   Procedure: CATARACT EXTRACTION PHACO AND INTRAOCULAR LENS PLACEMENT (IOC);  Surgeon: Lavonia Powers T. Gennie Kicks, MD;  Location: [facility];  Service: Ophthalmology;  Laterality: Left;  CDE:5.58   COLONOSCOPY     YAG LASER APPLICATION Right 12/07/2014   Procedure: YAG LASER APPLICATION;  Surgeon: Albert Huff, MD;  Location: [facility];  Service: Ophthalmology;  Laterality: Right;   Family History Family History  Problem Relation Age of Onset   Arthritis Other    Colon cancer Child    Heart failure Child     Social History Social History   Tobacco Use   Smoking status: Never   Smokeless tobacco: Never  Substance Use Topics   Alcohol use: No   Drug use: No   Allergies Penicillins and Aspirin   Review of Systems Review of Systems  Unable to perform ROS: Dementia    Physical Exam Vital Signs  I have reviewed the triage vital signs BP 121/79   Pulse 95   Temp 97.6 F (36.4 C) (Temporal)   Resp (!) 22   Ht 5\' 10"  (1.778 m)   Wt 77.1 kg    SpO2 97%   BMI 24.39 kg/m   Physical Exam Vitals and nursing note reviewed.  Constitutional:      General: He is in acute distress.     Appearance: He is well-developed. He is ill-appearing.  HENT:     Head: Normocephalic and atraumatic.  Eyes:     Conjunctiva/sclera: Conjunctivae normal.  Cardiovascular:     Rate and Rhythm: Normal rate and regular rhythm.  Heart sounds: No murmur heard. Pulmonary:     Effort: Pulmonary effort is normal. No respiratory distress.     Breath sounds: Rales present.  Abdominal:     General: There is distension.     Palpations: Abdomen is soft.     Tenderness: There is no abdominal tenderness.  Musculoskeletal:        General: No swelling.     Cervical back: Neck supple.  Skin:    General: Skin is warm and dry.     Capillary Refill: Capillary refill takes less than 2 seconds.  Neurological:     Mental Status: He is alert.  Psychiatric:        Mood and Affect: Mood normal.     ED Results and Treatments Labs (all labs ordered are listed, but only abnormal results are displayed) Labs Reviewed  COMPREHENSIVE METABOLIC PANEL WITH GFR - Abnormal; Notable for the following components:      Result Value   Sodium 132 (*)    Potassium 2.6 (*)    Glucose, Bld 185 (*)    BUN 27 (*)    Calcium 8.0 (*)    Total Protein 6.3 (*)    Albumin 2.9 (*)    AST 54 (*)    Total Bilirubin 1.4 (*)    All other components within normal limits  CBC WITH DIFFERENTIAL/PLATELET - Abnormal; Notable for the following components:   WBC 30.0 (*)    RBC 3.68 (*)    Hemoglobin 12.0 (*)    HCT 35.7 (*)    Platelets 437 (*)    Neutro Abs 26.9 (*)    Monocytes Absolute 1.7 (*)    Abs Immature Granulocytes 0.45 (*)    All other components within normal limits  TROPONIN I (HIGH SENSITIVITY) - Abnormal; Notable for the following components:   Troponin I (High Sensitivity) 18 (*)    All other components within normal limits  CULTURE, BLOOD (ROUTINE X 2)   CULTURE, BLOOD (ROUTINE X 2)  BRAIN NATRIURETIC PEPTIDE  LACTIC ACID, PLASMA  LACTIC ACID, PLASMA  URINALYSIS, ROUTINE W REFLEX MICROSCOPIC  TROPONIN I (HIGH SENSITIVITY)                                                                                                                          Radiology CT Angio Chest PE W and/or Wo Contrast Result Date: 09/06/2023 CLINICAL DATA:  Found unconscious. Aspiration. Pallor. Distended abdomen. EMS cleared airway and found 10 mL of white milkshake in patient's airway. EXAM: CT ANGIOGRAPHY CHEST CT ABDOMEN AND PELVIS WITH CONTRAST TECHNIQUE: Multidetector CT imaging of the chest was performed using the standard protocol during bolus administration of intravenous contrast. Multiplanar CT image reconstructions and MIPs were obtained to evaluate the vascular anatomy. Multidetector CT imaging of the abdomen and pelvis was performed using the standard protocol during bolus administration of intravenous contrast. RADIATION DOSE REDUCTION: This exam was performed according to the departmental dose-optimization program which includes automated exposure  control, adjustment of the mA and/or kV according to patient size and/or use of iterative reconstruction technique. CONTRAST:  OMNIPAQUE  IOHEXOL  350 MG/ML SOLN COMPARISON:  Same day chest radiograph; CT chest abdomen pelvis 08/26/2023 FINDINGS: CTA CHEST FINDINGS Cardiovascular: No pericardial effusion. Coronary artery and aortic atherosclerotic calcification. Motion obscures the segmental and subsegmental pulmonary arteries. No central pulmonary embolism. Mediastinum/Nodes: Layering debris in the posterior trachea. Esophagus is unremarkable. No thoracic adenopathy. Lungs/Pleura: Respiratory motion obscures fine detail particularly in the lower lungs. Diffuse bronchial wall thickening. Patchy Peribronchovascular consolidation in the lower lungs greatest in the right lower lobe. Trace right pleural effusion. No  pneumothorax. Musculoskeletal: Slightly increased displacement of the fractures of the posterior right 9th-11th ribs compared to 08/26/2023. No evidence of acute fracture. Review of the MIP images confirms the above findings. CT ABDOMEN and PELVIS FINDINGS Hepatobiliary: No acute abnormality. Pancreas: No acute abnormality. Spleen: Unremarkable. Adrenals/Urinary Tract: Stable adrenal glands. No urinary calculi or hydronephrosis. Gas within the bladder. The Foley catheter is malpositioned with balloon in the prostatic urethra. Stomach/Bowel: Stomach and appendix are within normal limits. No bowel wall thickening. Moderate stool burden in the transverse colon. No evidence of obstruction. Vascular/Lymphatic: Aortic atherosclerosis. No enlarged abdominal or pelvic lymph nodes. Reproductive: Marked enlargement of the prostate. Foley catheter balloon within the prostatic urethra. Other: No free intraperitoneal fluid or air. Musculoskeletal: No acute fracture. Review of the MIP images confirms the above findings. IMPRESSION: CHEST: 1. No central pulmonary embolism. Motion obscures the segmental and subsegmental pulmonary arteries. 2. Patchy peribronchovascular consolidation in the lower lungs greatest in the right lower lobe, likely aspiration pneumonia. 3. Trace right pleural effusion. ABDOMEN/PELVIS: 1. Foley catheter balloon is malpositioned with balloon in the prostatic urethra. Recommend repositioning. 2. Prostatomegaly. 3. Aortic Atherosclerosis (ICD10-I70.0). Electronically Signed   By: Rozell Cornet M.D.   On: 09/06/2023 22:05   CT ABDOMEN PELVIS W CONTRAST Result Date: 09/06/2023 CLINICAL DATA:  Found unconscious. Aspiration. Pallor. Distended abdomen. EMS cleared airway and found 10 mL of white milkshake in patient's airway. EXAM: CT ANGIOGRAPHY CHEST CT ABDOMEN AND PELVIS WITH CONTRAST TECHNIQUE: Multidetector CT imaging of the chest was performed using the standard protocol during bolus administration of  intravenous contrast. Multiplanar CT image reconstructions and MIPs were obtained to evaluate the vascular anatomy. Multidetector CT imaging of the abdomen and pelvis was performed using the standard protocol during bolus administration of intravenous contrast. RADIATION DOSE REDUCTION: This exam was performed according to the departmental dose-optimization program which includes automated exposure control, adjustment of the mA and/or kV according to patient size and/or use of iterative reconstruction technique. CONTRAST:  OMNIPAQUE  IOHEXOL  350 MG/ML SOLN COMPARISON:  Same day chest radiograph; CT chest abdomen pelvis 08/26/2023 FINDINGS: CTA CHEST FINDINGS Cardiovascular: No pericardial effusion. Coronary artery and aortic atherosclerotic calcification. Motion obscures the segmental and subsegmental pulmonary arteries. No central pulmonary embolism. Mediastinum/Nodes: Layering debris in the posterior trachea. Esophagus is unremarkable. No thoracic adenopathy. Lungs/Pleura: Respiratory motion obscures fine detail particularly in the lower lungs. Diffuse bronchial wall thickening. Patchy Peribronchovascular consolidation in the lower lungs greatest in the right lower lobe. Trace right pleural effusion. No pneumothorax. Musculoskeletal: Slightly increased displacement of the fractures of the posterior right 9th-11th ribs compared to 08/26/2023. No evidence of acute fracture. Review of the MIP images confirms the above findings. CT ABDOMEN and PELVIS FINDINGS Hepatobiliary: No acute abnormality. Pancreas: No acute abnormality. Spleen: Unremarkable. Adrenals/Urinary Tract: Stable adrenal glands. No urinary calculi or hydronephrosis. Gas within the bladder. The Foley  catheter is malpositioned with balloon in the prostatic urethra. Stomach/Bowel: Stomach and appendix are within normal limits. No bowel wall thickening. Moderate stool burden in the transverse colon. No evidence of obstruction. Vascular/Lymphatic:  Aortic atherosclerosis. No enlarged abdominal or pelvic lymph nodes. Reproductive: Marked enlargement of the prostate. Foley catheter balloon within the prostatic urethra. Other: No free intraperitoneal fluid or air. Musculoskeletal: No acute fracture. Review of the MIP images confirms the above findings. IMPRESSION: CHEST: 1. No central pulmonary embolism. Motion obscures the segmental and subsegmental pulmonary arteries. 2. Patchy peribronchovascular consolidation in the lower lungs greatest in the right lower lobe, likely aspiration pneumonia. 3. Trace right pleural effusion. ABDOMEN/PELVIS: 1. Foley catheter balloon is malpositioned with balloon in the prostatic urethra. Recommend repositioning. 2. Prostatomegaly. 3. Aortic Atherosclerosis (ICD10-I70.0). Electronically Signed   By: Rozell Cornet M.D.   On: 09/06/2023 22:05   DG Chest Portable 1 View Result Date: 09/06/2023 CLINICAL DATA:  Dyspnea EXAM: PORTABLE CHEST 1 VIEW COMPARISON:  05/31/2021, 08/26/2023 FINDINGS: Single frontal view of the chest demonstrates an unremarkable cardiac silhouette. Lung volumes are diminished, with chronic background scarring and bibasilar hypoventilatory changes noted. No airspace disease, effusion, or pneumothorax. No acute bony abnormalities. The right rib fracture seen on recent chest CT are not well visualized by x-ray. IMPRESSION: 1. Hypoventilatory changes.  No acute intrathoracic process. Electronically Signed   By: Bobbye Burrow M.D.   On: 09/06/2023 20:10    Pertinent labs & imaging results that were available during my care of the patient were reviewed by me and considered in my medical decision making (see MDM for details).  Medications Ordered in ED Medications  potassium chloride  (KLOR-CON ) packet 40 mEq (40 mEq Oral Not Given 09/06/23 2152)  potassium chloride  10 mEq in 100 mL IVPB (10 mEq Intravenous New Bag/Given 09/06/23 2228)  azithromycin (ZITHROMAX) 500 mg in sodium chloride  0.9 % 250 mL IVPB  (has no administration in time range)  cefTRIAXone (ROCEPHIN) 2 g in sodium chloride  0.9 % 100 mL IVPB (0 g Intravenous Stopped 09/06/23 2019)  iohexol  (OMNIPAQUE ) 350 MG/ML injection 100 mL (100 mLs Intravenous Contrast Given 09/06/23 2129)                                                                                                                                     Procedures .Critical Care  Performed by: Karlyn Overman, MD Authorized by: Karlyn Overman, MD   Critical care provider statement:    Critical care time (minutes):  30   Critical care was necessary to treat or prevent imminent or life-threatening deterioration of the following conditions:  Respiratory failure   Critical care was time spent personally by me on the following activities:  Development of treatment plan with patient or surrogate, discussions with consultants, evaluation of patient's response to treatment, examination of patient, ordering and review of laboratory studies, ordering and review of radiographic studies, ordering and performing treatments and  interventions, pulse oximetry, re-evaluation of patient's condition and review of old charts   (including critical care time)  Medical Decision Making / ED Course   This patient presents to the ED for concern of dyspnea, this involves an extensive number of treatment options, and is a complaint that carries with it a high risk of complications and morbidity.  The differential diagnosis includes Pe, PTX, Pulmonary Edema, ARDS, COPD/Asthma, ACS, CHF exacerbation, Arrhythmia, Pericardial Effusion/Tamponade, Anemia, Sepsis, Acidosis/Hypercapnia, Anxiety, Viral URI  MDM: Patient seen emergency room for evaluation of somnolence and shortness of breath.  Physical reveals an ill-appearing patient with rales at the bases, abdominal distention but is otherwise unremarkable.  Patient ultimately transitioned off of nonrebreather and is requiring 6 L nasal cannula to the oxygen  saturation.  Laboratory evaluation is concerning with a significant leukocytosis of 30.0, hemoglobin 12.0, platelet count 437, severe hypokalemia to 2.6 and IV potassium ordered, albumin 2.9.  Initial chest x-ray is unremarkable but ceftriaxone given given significant concern for aspiration.  Follow-up CT imaging including CT PE and CT abdomen pelvis obtained that shows aspiration pneumonia and a Foley balloon in the prostatic urethra.  His Foley catheter was exchanged and 700 cc of retained urine was drained.  This urine is concerning for infection and patient is already on appropriate antibiotic.  Patient require hospital admission for new significant hypoxia, UTI, hypokalemia and aspiration pneumonia.  Patient admitted  Of note, I did clarify with the patient's wife that he is a DNR/DNI and would not want heroic efforts including CPR or intubation  Additional history obtained: -Additional history obtained from multiple family members -External records from outside source obtained and reviewed including: Chart review including previous notes, labs, imaging, consultation notes   Lab Tests: -I ordered, reviewed, and interpreted labs.   The pertinent results include:   Labs Reviewed  COMPREHENSIVE METABOLIC PANEL WITH GFR - Abnormal; Notable for the following components:      Result Value   Sodium 132 (*)    Potassium 2.6 (*)    Glucose, Bld 185 (*)    BUN 27 (*)    Calcium 8.0 (*)    Total Protein 6.3 (*)    Albumin 2.9 (*)    AST 54 (*)    Total Bilirubin 1.4 (*)    All other components within normal limits  CBC WITH DIFFERENTIAL/PLATELET - Abnormal; Notable for the following components:   WBC 30.0 (*)    RBC 3.68 (*)    Hemoglobin 12.0 (*)    HCT 35.7 (*)    Platelets 437 (*)    Neutro Abs 26.9 (*)    Monocytes Absolute 1.7 (*)    Abs Immature Granulocytes 0.45 (*)    All other components within normal limits  TROPONIN I (HIGH SENSITIVITY) - Abnormal; Notable for the following  components:   Troponin I (High Sensitivity) 18 (*)    All other components within normal limits  CULTURE, BLOOD (ROUTINE X 2)  CULTURE, BLOOD (ROUTINE X 2)  BRAIN NATRIURETIC PEPTIDE  LACTIC ACID, PLASMA  LACTIC ACID, PLASMA  URINALYSIS, ROUTINE W REFLEX MICROSCOPIC  TROPONIN I (HIGH SENSITIVITY)      EKG   EKG Interpretation Date/Time:  Friday Sep 06 2023 18:56:11 EDT Ventricular Rate:  95 PR Interval:  188 QRS Duration:  98 QT Interval:  447 QTC Calculation: 559 R Axis:   9  Text Interpretation: Sinus rhythm Borderline repolarization abnormality Prolonged QT interval Confirmed by Evoleht Hovatter ([id number]) on 09/07/2023 1:18:10 AM  Imaging Studies ordered: I ordered imaging studies including chest x-ray, CT T PE, CTAP I independently visualized and interpreted imaging. I agree with the radiologist interpretation   Medicines ordered and prescription drug management: Meds ordered this encounter  Medications   cefTRIAXone (ROCEPHIN) 2 g in sodium chloride  0.9 % 100 mL IVPB    Antibiotic Indication::   CAP   iohexol  (OMNIPAQUE ) 350 MG/ML injection 100 mL   potassium chloride  (KLOR-CON ) packet 40 mEq   potassium chloride  10 mEq in 100 mL IVPB   azithromycin (ZITHROMAX) 500 mg in sodium chloride  0.9 % 250 mL IVPB    Antibiotic Indication::   CAP    -I have reviewed the patients home medicines and have made adjustments as needed  Critical interventions Supplemental oxygen, antibiotics   Cardiac Monitoring: The patient was maintained on a cardiac monitor.  I personally viewed and interpreted the cardiac monitored which showed an underlying rhythm of: NSR  Social Determinants of Health:  Factors impacting patients care include: Currently lives in skilled nursing facility   Reevaluation: After the interventions noted above, I reevaluated the patient and found that they have :improved  Co morbidities that complicate the patient evaluation  Past Medical  History:  Diagnosis Date   Arthritis    Diabetes mellitus without complication (HCC)    diet controlled   History of elevated PSA    Hyperlipidemia    Hypertension    Stroke [facility])       Dispostion: I considered admission for this patient, and patient require hospital admission for aspiration pneumonia with new hypoxia and electrolyte abnormalities     Final Clinical Impression(s) / ED Diagnoses Final diagnoses:  None     @PCDICTATION @    Karlyn Overman, MD 09/07/23 [phone]

## 2023-09-06 NOTE — Telephone Encounter (Signed)
 [facility] home [facility] attempted to call [facility] (?). Declined needing triage assistance.  Reported she already called 911.    No additional questions/concerns noted during the call.     Copied from CRM [id number]. Topic: Clinical - Red Word Triage >> Sep 06, 2023  5:57 PM Brynn Caras wrote: Red Word that prompted transfer to Nurse Triage: Louanne Roussel LPN is reporting that the patient was unresponsive for 5 seconds after eating, O2 is 64 on 2 liters of O2.

## 2023-09-06 NOTE — ED Notes (Signed)
 Pt restless, family at bedside and pt continues pulling at lines and drains. Bilateral hand mitts applied at this time

## 2023-09-06 NOTE — ED Triage Notes (Addendum)
 Pt comes by EMS from [facility] nursing center for unconscious and aspiration. Pt is on thick fluid diet and given a milkshake. Pt was found unconscious and pale. EMS cleared his airway and got about 10 mL of white milkshake consistency out of pts airway. Pt abdomen distention is noted.  Pt is still unconscious during triage.   EMS  BP 86/40 -> 108/38 after 100mL of IV NS  62% 15L nonrebrether -> 92% nonrebreather after suction was completed

## 2023-09-07 DIAGNOSIS — J9600 Acute respiratory failure, unspecified whether with hypoxia or hypercapnia: Secondary | ICD-10-CM | POA: Diagnosis present

## 2023-09-07 DIAGNOSIS — N401 Enlarged prostate with lower urinary tract symptoms: Secondary | ICD-10-CM

## 2023-09-07 DIAGNOSIS — N400 Enlarged prostate without lower urinary tract symptoms: Secondary | ICD-10-CM

## 2023-09-07 DIAGNOSIS — E1169 Type 2 diabetes mellitus with other specified complication: Secondary | ICD-10-CM | POA: Diagnosis not present

## 2023-09-07 DIAGNOSIS — I10 Essential (primary) hypertension: Secondary | ICD-10-CM

## 2023-09-07 DIAGNOSIS — J690 Pneumonitis due to inhalation of food and vomit: Secondary | ICD-10-CM

## 2023-09-07 DIAGNOSIS — E876 Hypokalemia: Secondary | ICD-10-CM

## 2023-09-07 DIAGNOSIS — G20A1 Parkinson's disease without dyskinesia, without mention of fluctuations: Secondary | ICD-10-CM

## 2023-09-07 DIAGNOSIS — E785 Hyperlipidemia, unspecified: Secondary | ICD-10-CM

## 2023-09-07 DIAGNOSIS — S2241XS Multiple fractures of ribs, right side, sequela: Secondary | ICD-10-CM

## 2023-09-07 LAB — HEMOGLOBIN A1C
Hgb A1c MFr Bld: 6.2 % — ABNORMAL HIGH (ref 4.8–5.6)
Mean Plasma Glucose: 131.24 mg/dL

## 2023-09-07 LAB — BASIC METABOLIC PANEL WITH GFR
Anion gap: 6 (ref 5–15)
BUN: 28 mg/dL — ABNORMAL HIGH (ref 8–23)
CO2: 21 mmol/L — ABNORMAL LOW (ref 22–32)
Calcium: 7.4 mg/dL — ABNORMAL LOW (ref 8.9–10.3)
Chloride: 108 mmol/L (ref 98–111)
Creatinine, Ser: 0.94 mg/dL (ref 0.61–1.24)
GFR, Estimated: >60 mL/min (ref >60)
Glucose, Bld: 125 mg/dL — ABNORMAL HIGH (ref 70–99)
Potassium: 3 mmol/L — ABNORMAL LOW (ref 3.5–5.1)
Sodium: 135 mmol/L (ref 135–145)

## 2023-09-07 LAB — CBC
HCT: 30.4 % — ABNORMAL LOW (ref 39.0–52.0)
Hemoglobin: 10.6 g/dL — ABNORMAL LOW (ref 13.0–17.0)
MCH: 33.1 pg (ref 26.0–34.0)
MCHC: 34.9 g/dL (ref 30.0–36.0)
MCV: 95 fL (ref 80.0–100.0)
Platelets: 426 10*3/uL — ABNORMAL HIGH (ref 150–400)
RBC: 3.2 MIL/uL — ABNORMAL LOW (ref 4.22–5.81)
RDW: 12.8 % (ref 11.5–15.5)
WBC: 21.4 10*3/uL — ABNORMAL HIGH (ref 4.0–10.5)
nRBC: 0 % (ref 0.0–0.2)

## 2023-09-07 LAB — LACTIC ACID, PLASMA: Lactic Acid, Venous: 1.3 mmol/L (ref 0.5–1.9)

## 2023-09-07 LAB — GLUCOSE, CAPILLARY
Glucose-Capillary: 100 mg/dL — ABNORMAL HIGH (ref 70–99)
Glucose-Capillary: 110 mg/dL — ABNORMAL HIGH (ref 70–99)

## 2023-09-07 LAB — CBG MONITORING, ED: Glucose-Capillary: 106 mg/dL — ABNORMAL HIGH (ref 70–99)

## 2023-09-07 MED ORDER — POTASSIUM CHLORIDE 10 MEQ/100ML IV SOLN
INTRAVENOUS | Status: AC
  Filled 2023-09-07: qty 100

## 2023-09-07 MED ORDER — PANTOPRAZOLE SODIUM 40 MG PO TBEC
40.0000 mg | DELAYED_RELEASE_TABLET | Freq: Every day | ORAL | Status: DC
  Administered 2023-09-08 – 2023-09-10 (×3): 40 mg via ORAL
  Filled 2023-09-07 (×4): qty 1

## 2023-09-07 MED ORDER — TAMSULOSIN HCL 0.4 MG PO CAPS
0.4000 mg | ORAL_CAPSULE | Freq: Every day | ORAL | Status: DC
  Administered 2023-09-09 – 2023-09-10 (×2): 0.4 mg via ORAL
  Filled 2023-09-07 (×2): qty 1

## 2023-09-07 MED ORDER — OXYCODONE HCL 5 MG PO TABS
5.0000 mg | ORAL_TABLET | Freq: Four times a day (QID) | ORAL | Status: DC | PRN
  Administered 2023-09-09: 5 mg via ORAL
  Filled 2023-09-07: qty 1

## 2023-09-07 MED ORDER — PRAVASTATIN SODIUM 40 MG PO TABS
40.0000 mg | ORAL_TABLET | Freq: Every day | ORAL | Status: DC
Start: 2023-09-07 — End: 2023-09-11
  Administered 2023-09-08 – 2023-09-11 (×4): 40 mg via ORAL
  Filled 2023-09-07 (×4): qty 1

## 2023-09-07 MED ORDER — MORPHINE SULFATE (PF) 2 MG/ML IV SOLN: 1.0000 mg | INTRAVENOUS | Status: DC | PRN

## 2023-09-07 MED ORDER — ONDANSETRON HCL 4 MG/2ML IJ SOLN: 4.0000 mg | Freq: Four times a day (QID) | INTRAMUSCULAR | Status: DC | PRN

## 2023-09-07 MED ORDER — CARBIDOPA-LEVODOPA 25-100 MG PO TABS
2.0000 | ORAL_TABLET | ORAL | Status: DC
  Administered 2023-09-08 – 2023-09-11 (×9): 2 via ORAL
  Filled 2023-09-07 (×8): qty 2

## 2023-09-07 MED ORDER — MAGNESIUM SULFATE IN D5W 1-5 GM/100ML-% IV SOLN
1.0000 g | Freq: Once | INTRAVENOUS | Status: AC
  Administered 2023-09-07: 1 g via INTRAVENOUS
  Filled 2023-09-07: qty 100

## 2023-09-07 MED ORDER — INSULIN ASPART 100 UNIT/ML IJ SOLN
0.0000 [IU] | Freq: Three times a day (TID) | INTRAMUSCULAR | Status: DC
  Administered 2023-09-09: 1 [IU] via SUBCUTANEOUS
  Administered 2023-09-09 (×2): 2 [IU] via SUBCUTANEOUS
  Administered 2023-09-10 – 2023-09-11 (×2): 1 [IU] via SUBCUTANEOUS

## 2023-09-07 MED ORDER — POLYETHYLENE GLYCOL 3350 17 G PO PACK
17.0000 g | PACK | Freq: Every day | ORAL | Status: DC
  Administered 2023-09-08 – 2023-09-10 (×3): 17 g via ORAL
  Filled 2023-09-07 (×4): qty 1

## 2023-09-07 MED ORDER — VITAMIN B-12 1000 MCG PO TABS
1000.0000 ug | ORAL_TABLET | Freq: Every day | ORAL | Status: DC
  Administered 2023-09-08 – 2023-09-11 (×4): 1000 ug via ORAL
  Filled 2023-09-07 (×5): qty 1

## 2023-09-07 MED ORDER — CARBIDOPA-LEVODOPA 25-100 MG PO TABS: 2.0000 | ORAL_TABLET | Freq: Three times a day (TID) | ORAL | Status: DC

## 2023-09-07 MED ORDER — CHLORHEXIDINE GLUCONATE CLOTH 2 % EX PADS
6.0000 | MEDICATED_PAD | Freq: Every day | CUTANEOUS | Status: DC
  Administered 2023-09-07 – 2023-09-11 (×5): 6 via TOPICAL

## 2023-09-07 MED ORDER — ENOXAPARIN SODIUM 40 MG/0.4ML IJ SOSY
40.0000 mg | PREFILLED_SYRINGE | INTRAMUSCULAR | Status: DC
  Administered 2023-09-07 – 2023-09-11 (×5): 40 mg via SUBCUTANEOUS
  Filled 2023-09-07 (×5): qty 0.4

## 2023-09-07 MED ORDER — SODIUM CHLORIDE 0.9 % IV SOLN
1.0000 g | INTRAVENOUS | Status: DC
  Administered 2023-09-07 – 2023-09-11 (×5): 1 g via INTRAVENOUS
  Filled 2023-09-07 (×5): qty 10

## 2023-09-07 MED ORDER — ONDANSETRON HCL 4 MG PO TABS: 4.0000 mg | ORAL_TABLET | Freq: Four times a day (QID) | ORAL | Status: DC | PRN

## 2023-09-07 MED ORDER — SODIUM CHLORIDE 0.9 % IV SOLN: INTRAVENOUS | Status: AC

## 2023-09-07 MED ORDER — POTASSIUM CHLORIDE 10 MEQ/100ML IV SOLN
10.0000 meq | INTRAVENOUS | Status: AC
  Administered 2023-09-07 (×6): 10 meq via INTRAVENOUS
  Filled 2023-09-07 (×6): qty 100

## 2023-09-07 MED ORDER — POTASSIUM CHLORIDE 10 MEQ/100ML IV SOLN
10.0000 meq | INTRAVENOUS | Status: AC
  Administered 2023-09-07 (×4): 10 meq via INTRAVENOUS
  Filled 2023-09-07 (×3): qty 100

## 2023-09-07 MED ORDER — ACETAMINOPHEN 325 MG PO TABS: 650.0000 mg | ORAL_TABLET | Freq: Four times a day (QID) | ORAL | Status: DC | PRN

## 2023-09-07 MED ORDER — ACETAMINOPHEN 650 MG RE SUPP: 650.0000 mg | Freq: Four times a day (QID) | RECTAL | Status: DC | PRN

## 2023-09-07 NOTE — Plan of Care (Signed)

## 2023-09-07 NOTE — Progress Notes (Signed)
 Progress Note   Patient: Mark Brady:[id number] DOB: March 11, 1930 DOA: 09/06/2023     1 DOS: the patient was seen and examined on 09/07/2023   Brief hospital admission narrative: As per H&P written by Dr. Sunnie England on 09/06/2023 Mark Brady is a 89 y.o. male with medical history significant of T2DM, hypertension, hyperlipidemia and advanced dementia who presented with respiratory distress.  Recent hospitalization 05/12 to 08/29/23 after a mechanical fall, resulting in rib fractures. He was discharged to SNF with a foley catheter due to urinary retention.  Most information is from his wife at the bedside, patient is not communicative.  Apparently patient loss his consciousness after aspirating a milkshake. EMS was called and he got about 10 ml of white milkshake cleared from his upper airway. Patient recovered his consciousness and was transported to the ED. His 02 saturation was 62% and required 15 L per non re breather mask to increase to 92%.    At the time of my examination patient is not verbal, he has mittens in place and appears in pain.  His foley was repositioned after patient pulling the tubing.    Per his wife patient's dementia has been progressively worsening since his initial hospitalization on 08/21/23 and posterior transfer to SNF.  He has been having hallucinations and has been fully dependent for his daily activities of daily living.    Assessment and plan Aspiration pneumonia of right lower lobe (HCC) -Complicated with severe sepsis present on admission.  - Continue IV antibiotics - Wean off oxygen supplementation as tolerated - Follow culture results and follow-up recommendations by speech therapy - Overall poor prognosis. - Patient already DNR/DNI.  Hypokalemia/Hyponatremia.  - Continue fluid resuscitation - Follow electrolytes trend and replete as needed - Will continue telemetry monitoring for now. - Will check magnesium level.  Essential hypertension,  benign - Blood pressure overall stable - Continue to hold antihypertensive agents at the moment with concerns for high risk hypotension in the setting of sepsis. - Follow vital signs.  Type 2 diabetes mellitus with hyperlipidemia (HCC) -Continue sliding scale insulin .  Parkinson's disease (HCC) -Advanced dementia. Acute metabolic encephalopathy due to pneumonia with acute respiratory failure.  - Continue Sinemet   Rib fractures -Overall with poor prognosis - Continue as needed analgesics.  BPH (benign prostatic hyperplasia)/urinary retention -Continue foley catheter in place, it has been repositioned in the ED.  -Follow culture result - Continue current antibiotics - Continue supportive care. - During recent hospitalization patient failed voiding trial.   Subjective:  Currently afebrile, no nausea, no vomiting.  Requiring high flow nasal cannula supplementation and was sleepy.  Physical Exam: Vitals:   09/07/23 0915 09/07/23 0930 09/07/23 0945 09/07/23 1000  BP: (!) 109/51 (!) 111/51 (!) 116/55 99/77  Pulse: 69 71 75 72  Resp: 16 19 15 14   Temp:      TempSrc:      SpO2: 100% 100% 100% 100%  Weight:      Height:       General exam: Somnolent, in no acute distress; high flow nasal cannula in place. Respiratory system: Positive scattered rhonchi; no using accessory muscles.  High flow nasal cannula supplementation in place. Cardiovascular system: Rate controlled, no rubs, no gallops; positive murmur. Gastrointestinal system: Abdomen is nondistended, soft and nontender. No organomegaly or masses felt. Normal bowel sounds heard. Central nervous system:  No focal neurological deficits. Extremities: No cyanosis, clubbing or edema. Skin: No petechiae. Psychiatry: Judgement and insight appear impaired secondary to underlying dementia.  Data Reviewed: Lactic acid: 2.4 >> 1.3 CBC: WBCs 21.4, hemoglobin 10.6 platelet count 426K Basic metabolic panel: Sodium 135, potassium 3.0,  chloride 108, bicarb 21, BUN 28, creatinine 0.94 and GFR >60   Family Communication: No family at bedside.  Disposition: Status is: Inpatient Remains inpatient appropriate because: Continue IV antibiotics.  Anticipate discharge back to skilled nursing facility once medically stable.  Time spent: 50 minutes  Author: Justina Oman, MD 09/07/2023 10:07 AM  For on call review [email or web address].

## 2023-09-07 NOTE — Assessment & Plan Note (Addendum)
 Continue foley catheter in place, it has been repositioned in the ED.  Positive foley trauma Positive pyuria, will continue to follow up cultures, not clear significance of urinary wbc in the setting chronic foley catheter.

## 2023-09-07 NOTE — Assessment & Plan Note (Signed)
 Multiple fractures and falls.  Plan to continue pain control with IV morphine  He has a poor prognosis

## 2023-09-07 NOTE — Progress Notes (Signed)
   09/07/23 1536  TOC Assessment  TOC screening is complete Yes  Expected Discharge Plan Skilled Nursing Facility  Final next level of care Skilled Nursing Facility  Barriers to Discharge Continued Medical Work up  Patient states their goals for this hospitalization and ongoing recovery are: [employer].gov Compare Post Acute Care list provided to: Patient  Choice offered to / list presented to  Patient  Regino Ramirez ownership interest in [facility].provided to: Spouse  Post Acute Care Choice Skilled Nursing Facility  Living arrangements for the past 2 months Skilled Nursing Facility  Lives with: Spouse  Share Information with NAME Stana Ear  Permission granted to share info w AGENCY Delvon Chipps  Permission granted to share info w Relationship spouse  Permission granted to share info w Contact Information [phone]  Patient language and need for interpreter reviewed: No  Criminal Activity/Legal Involvement Pertinent to Current Situation/Hospitalization No - Comment as needed  Need for Family Participation in Patient Care Y  Care giver support system in place? Y  Appearance: Appears stated age  Orientation:  Oriented to Self   Admitted aspiration pneumonia right lung. We will continue to monitor patient advancement through interdisciplinary progressions rounds.

## 2023-09-07 NOTE — ED Notes (Signed)
 Foley catheter draining properly, 500cc dark colored urine noted in bag

## 2023-09-07 NOTE — Assessment & Plan Note (Signed)
 Hold on amlodipine  and losartan  due to risk of hypotension.  Continue blood pressure monitoring

## 2023-09-07 NOTE — TOC Initial Note (Signed)
 Transition of Care [facility]) - Initial/Assessment Note    Patient Details  Name: Mark Brady MRN: [id number] Date of Birth: Jan 25, 1930  [facility] CM/SW Contact:    Lynda Sands, RN Phone Number: 09/07/2023, 4:13 PM  Clinical Narrative:   CM met with patient's sister at bedside. Patient's sister reports patient was at the [facility] when he fell. CM left message with patient's spouse to discuss discharge plan for patient.   Expected Discharge Plan: Skilled Nursing Facility Barriers to Discharge: Continued Medical Work up   Patient Goals and CMS Choice Patient states their goals for this hospitalization and ongoing recovery are:: [facility] Medicare.gov Compare Post Acute Care list provided to:: Patient Choice offered to / list presented to : Patient [place] ownership interest in [facility].provided to:: Spouse    Expected Discharge Plan and Services     Post Acute Care Choice: Skilled Nursing Facility Living arrangements for the past 2 months: Skilled Nursing Facility                                      Prior Living Arrangements/Services Living arrangements for the past 2 months: Skilled Nursing Facility Lives with:: Spouse Patient language and need for interpreter reviewed:: No        Need for Family Participation in Patient Care: Yes (Comment) Care giver support system in place?: Yes (comment)   Criminal Activity/Legal Involvement Pertinent to Current Situation/Hospitalization: No - Comment as needed  Activities of Daily Living   ADL Screening (condition at time of admission) Independently performs ADLs?: No Does the patient have a NEW difficulty with bathing/dressing/toileting/self-feeding that is expected to last >3 days?: Yes (Initiates electronic notice to provider for possible OT consult) Does the patient have a NEW difficulty with getting in/out of bed, walking, or climbing stairs that is expected to last >3  days?: Yes (Initiates electronic notice to provider for possible PT consult) Does the patient have a NEW difficulty with communication that is expected to last >3 days?: Yes (Initiates electronic notice to provider for possible SLP consult) Is the patient deaf or have difficulty hearing?: Yes Does the patient have difficulty seeing, even when wearing glasses/contacts?: No Does the patient have difficulty concentrating, remembering, or making decisions?: Yes  Permission Sought/Granted      Share Information with NAME: Stana Ear  Permission granted to share info w AGENCY: [facility]  Permission granted to share info w Relationship: spouse  Permission granted to share info w Contact Information: [phone]  Emotional Assessment Appearance:: Appears stated age     Orientation: : Oriented to Self      Admission diagnosis:  Acute respiratory failure (HCC) [J96.00] Hypokalemia [E87.6] Pneumonia [J18.9] Hypoxia [R09.02] Aspiration into airway, initial encounter [T17.908A] Patient Active Problem List   Diagnosis Date Noted   BPH (benign prostatic hyperplasia) 09/07/2023   Acute respiratory failure (HCC) 09/07/2023   Aspiration pneumonia of right lower lobe (HCC) 09/06/2023   AKI (acute kidney injury) (HCC) 08/27/2023   Acute urinary retention 08/27/2023   Hyponatremia 08/27/2023   Hypokalemia 08/27/2023   Hypertension associated with type 2 diabetes mellitus (HCC) 08/26/2023   Hyperlipidemia associated with type 2 diabetes mellitus (HCC) 08/26/2023   Vitamin B 12 deficiency 08/26/2023   Aortic atherosclerosis (HCC) 08/26/2023   History of Parkinson's disease 08/23/2023   Rib fractures 08/21/2023   Parkinson's disease dementia (HCC) 04/23/2023   Parkinson's  disease (HCC) 07/24/2021   Sialorrhea 05/14/2020   Gastroesophageal reflux disease without esophagitis 05/13/2020   Glaucoma 11/23/2019   Type 2 diabetes mellitus with hyperlipidemia (HCC) 10/19/2012   Essential  hypertension, benign 09/28/2012   Hyperlipidemia 09/28/2012   PCP:  Myrna Ast, DO Pharmacy:   [facility] [id number] - [place],  - [address] [place] [address] Phone: [phone] Fax: [phone]  [facility] [place], [place] - [address] [place] [address] Phone: [phone] Fax: [phone]     Social Drivers of Health (SDOH) Social History: SDOH Screenings   Food Insecurity: No Food Insecurity (09/07/2023)  Housing: Low Risk  (09/07/2023)  Transportation Needs: No Transportation Needs (09/07/2023)  Utilities: Not At Risk (09/07/2023)  Alcohol Screen: Low Risk  (05/17/2023)  Depression (PHQ2-9): Low Risk  (05/29/2023)  Financial Resource Strain: Low Risk  (05/17/2023)  Physical Activity: Insufficiently Active (05/17/2023)  Social Connections: Unknown (09/07/2023)  Stress: No Stress Concern Present (05/17/2023)  Tobacco Use: Low Risk  (09/06/2023)  Health Literacy: Patient Declined (05/17/2023)   SDOH Interventions:     Readmission Risk Interventions    08/29/2023   10:10 AM  Readmission Risk Prevention Plan  Transportation Screening Complete  PCP or Specialist Appt within 3-5 Days Complete  HRI or Home Care Consult Complete  Social Work Consult for Recovery Care Planning/Counseling Complete  Palliative Care Screening Not Applicable  Medication Review Oceanographer) Complete

## 2023-09-07 NOTE — Assessment & Plan Note (Signed)
 Advanced dementia. Acute metabolic encephalopathy.   Continue neuro checks per unit protocol.  Continue mittens to protect lines and catheters  Plan to continue Sinemet , follow up with speech therapy, Pt and Ot He had multiple falls in the recent past.  Poor prognosis.

## 2023-09-07 NOTE — Assessment & Plan Note (Addendum)
 Complicated with severe sepsis present on admission.   Plan to continue aspiration precautions  IV antibiotic therapy with ceftriaxone (reported allergic to penicillin)  Hold on systemic steroids  IV fluids  Follow up cultures, cell count and temperature curve.  Poor prognosis considering his advanced dementia and parkinson's disease. Consult speech therapy

## 2023-09-07 NOTE — Assessment & Plan Note (Signed)
 Hyponatremia.   Plan to continue IV fluids with isotonic saline  Continue K correction with IV Kcl.  Follow up renal function and electrolytes in am.  Poor oral intake, poor prognosis

## 2023-09-07 NOTE — Assessment & Plan Note (Signed)
 Continue close glucose cover and monitoring with insulin  sliding scale Continue statin therapy

## 2023-09-08 DIAGNOSIS — I10 Essential (primary) hypertension: Secondary | ICD-10-CM | POA: Diagnosis not present

## 2023-09-08 DIAGNOSIS — E1169 Type 2 diabetes mellitus with other specified complication: Secondary | ICD-10-CM | POA: Diagnosis not present

## 2023-09-08 DIAGNOSIS — N401 Enlarged prostate with lower urinary tract symptoms: Secondary | ICD-10-CM | POA: Diagnosis not present

## 2023-09-08 DIAGNOSIS — J690 Pneumonitis due to inhalation of food and vomit: Secondary | ICD-10-CM | POA: Diagnosis not present

## 2023-09-08 LAB — CBC
HCT: 33.1 % — ABNORMAL LOW (ref 39.0–52.0)
Hemoglobin: 11 g/dL — ABNORMAL LOW (ref 13.0–17.0)
MCH: 33.1 pg (ref 26.0–34.0)
MCHC: 33.2 g/dL (ref 30.0–36.0)
MCV: 99.7 fL (ref 80.0–100.0)
Platelets: 456 10*3/uL — ABNORMAL HIGH (ref 150–400)
RBC: 3.32 MIL/uL — ABNORMAL LOW (ref 4.22–5.81)
RDW: 13.2 % (ref 11.5–15.5)
WBC: 15.3 10*3/uL — ABNORMAL HIGH (ref 4.0–10.5)
nRBC: 0 % (ref 0.0–0.2)

## 2023-09-08 LAB — GLUCOSE, CAPILLARY
Glucose-Capillary: 99 mg/dL (ref 70–99)
Glucose-Capillary: 100 mg/dL — ABNORMAL HIGH (ref 70–99)
Glucose-Capillary: 109 mg/dL — ABNORMAL HIGH (ref 70–99)
Glucose-Capillary: 135 mg/dL — ABNORMAL HIGH (ref 70–99)

## 2023-09-08 LAB — BASIC METABOLIC PANEL WITH GFR
Anion gap: 6 (ref 5–15)
BUN: 23 mg/dL (ref 8–23)
CO2: 20 mmol/L — ABNORMAL LOW (ref 22–32)
Calcium: 7.6 mg/dL — ABNORMAL LOW (ref 8.9–10.3)
Chloride: 111 mmol/L (ref 98–111)
Creatinine, Ser: 0.75 mg/dL (ref 0.61–1.24)
GFR, Estimated: >60 mL/min (ref >60)
Glucose, Bld: 106 mg/dL — ABNORMAL HIGH (ref 70–99)
Potassium: 2.8 mmol/L — ABNORMAL LOW (ref 3.5–5.1)
Sodium: 137 mmol/L (ref 135–145)

## 2023-09-08 LAB — MAGNESIUM: Magnesium: 2 mg/dL (ref 1.7–2.4)

## 2023-09-08 MED ORDER — POTASSIUM CHLORIDE 10 MEQ/100ML IV SOLN
10.0000 meq | INTRAVENOUS | Status: AC
  Administered 2023-09-08 (×4): 10 meq via INTRAVENOUS
  Filled 2023-09-08 (×4): qty 100

## 2023-09-08 NOTE — Progress Notes (Signed)
 Progress Note   Patient: Mark Brady WUJ:[id number] DOB: 07/22/1929 DOA: 09/06/2023     2 DOS: the patient was seen and examined on 09/08/2023   Brief hospital admission narrative: As per H&P written by Dr. Sunnie England on 09/06/2023 Per L Greth is a 89 y.o. male with medical history significant of T2DM, hypertension, hyperlipidemia and advanced dementia who presented with respiratory distress.  Recent hospitalization 05/12 to 08/29/23 after a mechanical fall, resulting in rib fractures. He was discharged to SNF with a foley catheter due to urinary retention.  Most information is from his wife at the bedside, patient is not communicative.  Apparently patient loss his consciousness after aspirating a milkshake. EMS was called and he got about 10 ml of white milkshake cleared from his upper airway. Patient recovered his consciousness and was transported to the ED. His 02 saturation was 62% and required 15 L per non re breather mask to increase to 92%.    At the time of my examination patient is not verbal, he has mittens in place and appears in pain.  His foley was repositioned after patient pulling the tubing.    Per his wife patient's dementia has been progressively worsening since his initial hospitalization on 08/21/23 and posterior transfer to SNF.  He has been having hallucinations and has been fully dependent for his daily activities of daily living.    Assessment and plan Aspiration pneumonia of right lower lobe (HCC) -Complicated with severe sepsis present on admission.  - Continue current IV antibiotics - Patient currently off oxygen supplementation with good saturation. - Follow culture results and follow-up recommendations by speech therapy - Overall poor/guarded prognosis. - Patient already DNR/DNI. - Follow speech therapy recommendation - Will allow full liquid diet after good oral care while pending evaluation.  Hypokalemia/Hyponatremia.  - Continue to maintain  adequate hide ration. - Continue to follow electrolytes trend and further replete as needed - Will continue telemetry monitoring for now. - Magnesium level within normal limits.  Essential hypertension, benign - Blood pressure overall stable - Continue to hold antihypertensive agents at the moment with concerns for high risk hypotension in the setting of sepsis. - Follow vital signs.  Type 2 diabetes mellitus with hyperlipidemia (HCC) -Continue sliding scale insulin .  Parkinson's disease (HCC) -Advanced dementia. Acute metabolic encephalopathy due to pneumonia with acute respiratory failure.  - Continue Sinemet   Rib fractures -Overall with poor prognosis - Continue as needed analgesics.  BPH (benign prostatic hyperplasia)/urinary retention -Continue foley catheter in place, it has been repositioned in the ED.  -Follow culture result - Continue current antibiotics - Continue supportive care. - During recent hospitalization patient failed voiding trial.   Subjective:  Afebrile, no chest pain, no nausea, no vomiting.  Good saturation on room air.  Improving and currently in no acute distress.  Physical Exam: Vitals:   09/07/23 1200 09/07/23 2040 09/08/23 0357 09/08/23 1421  BP: 118/60 129/66 (!) 145/76 (!) 146/72  Pulse: 77 93 90 85  Resp: 16 20 20 19   Temp: 98.5 F (36.9 C) (!) 97.4 F (36.3 C) 98.5 F (36.9 C) 98.3 F (36.8 C)  TempSrc: Oral Oral Oral   SpO2: 94% 100%  99%  Weight:      Height:       General exam: Alert, awake, oriented x 2; able to follow simple commands.  In no acute distress.  Good saturation on room air. Respiratory system: Rhonchi; no wheezing, no using accessory muscle. Cardiovascular system:RRR. No rubs or gallops; no  JVD. Gastrointestinal system: Abdomen is nondistended, soft and nontender. No organomegaly or masses felt. Normal bowel sounds heard. Central nervous system: No focal neurological deficits. Extremities: No cyanosis or  clubbing. Skin: No petechiae.  Foley catheter in place. Psychiatry: Flat affect appreciated on exam.  Data Reviewed: Lactic acid: 2.4 >> 1.3 Magnesium: 2.0 Basic metabolic panel: Sodium 137, potassium 2.8, chloride 111, bicarb 20, glucose 106, BUN 23, creatinine 0.75 and GFR >60 CBC: WBCs 15.3, hemoglobin 11.0 platelet count 456K  Family Communication: Daughter updated at bedside.  Disposition: Status is: Inpatient Remains inpatient appropriate because: Continue IV antibiotics.  Anticipate discharge back to skilled nursing facility once medically stable.  Time spent: 50 minutes  Author: Justina Oman, MD 09/08/2023 5:03 PM  For on call review [email or web address].

## 2023-09-08 NOTE — Plan of Care (Signed)

## 2023-09-08 NOTE — Plan of Care (Signed)
  Problem: Skin Integrity: Goal: Risk for impaired skin integrity will decrease Outcome: Progressing   Problem: Pain Managment: Goal: General experience of comfort will improve and/or be controlled Outcome: Progressing   Problem: Safety: Goal: Ability to remain free from injury will improve Outcome: Progressing

## 2023-09-08 NOTE — Evaluation (Signed)
 Physical Therapy Evaluation Patient Details Name: Mark Brady MRN: [id number] DOB: 16-Sep-1929 Today's Date: 09/08/2023  History of Present Illness  Mark Brady is a 89 y.o. male with medical history significant of T2DM, hypertension, hyperlipidemia and advanced dementia who presented with respiratory distress.   Recent hospitalization 05/12 to 08/29/23 after a mechanical fall, resulting in rib fractures. He was discharged to SNF with a foley catheter due to urinary retention.   Most information is from his wife at the bedside, patient is not communicative.   Apparently patient loss his consciousness after aspirating a milkshake. EMS was called and he got about 10 ml of white milkshake cleared from his upper airway. Patient recovered his consciousness and was transported to the ED. His 02 saturation was 62% and required 15 L per non re breather mask to increase to 92%.      At the time of my examination patient is not verbal, he has mittens in place and appears in pain.   His foley was repositioned after patient pulling the tubing.      Per his wife patient's dementia has been progressively worsening since his initial hospitalization on 08/21/23 and posterior transfer to SNF.   He has been having hallucinations and has been fully dependent for his daily activities of daily living.   Clinical Impression  Patient demonstrates slow labored movement for sitting up at bedside, once seated had frequent posterior leaning, poor tolerance for maintaining standing balance due to leaning/falling backwards requiring repeated verbal/tactile cueing to prevent loss of balance and limited to a few steps forward/backwards at bedside before having to sit due to fall risk. Patient tolerated sitting up in chair after therapy with family members present in room - nurse notified. Patient will benefit from continued skilled physical therapy in hospital and recommended venue below to increase strength, balance, endurance  for safe ADLs and gait.          If plan is discharge home, recommend the following: A lot of help with bathing/dressing/bathroom;A lot of help with walking and/or transfers;Help with stairs or ramp for entrance;Assistance with cooking/housework   Can travel by private vehicle   No    Equipment Recommendations None recommended by PT  Recommendations for Other Services       Functional Status Assessment Patient has had a recent decline in their functional status and demonstrates the ability to make significant improvements in function in a reasonable and predictable amount of time.     Precautions / Restrictions Precautions Precautions: Fall Recall of Precautions/Restrictions: Impaired Restrictions Weight Bearing Restrictions Per Provider Order: No      Mobility  Bed Mobility Overal bed mobility: Needs Assistance Bed Mobility: Supine to Sit     Supine to sit: Max assist     General bed mobility comments: increased time, labored movement    Transfers Overall transfer level: Needs assistance Equipment used: Rolling walker (2 wheels) Transfers: Sit to/from Stand, Bed to chair/wheelchair/BSC Sit to Stand: Mod assist   Step pivot transfers: Mod assist, Max assist       General transfer comment: unsteady labored movement with severe posterior leaning    Ambulation/Gait Ambulation/Gait assistance: Mod assist, Max assist Gait Distance (Feet): 8 Feet Assistive device: Rolling walker (2 wheels) Gait Pattern/deviations: Decreased step length - right, Decreased step length - left, Decreased stride length, Shuffle, Leaning posteriorly Gait velocity: slow     General Gait Details: limited to a few slow labored side steps, steps forward/backward due to severe posterior leaning  requiring verbal/tactile cueing to avoid falling backwards  Stairs            Wheelchair Mobility     Tilt Bed    Modified Rankin (Stroke Patients Only)       Balance Overall  balance assessment: Needs assistance Sitting-balance support: Feet supported, No upper extremity supported Sitting balance-Leahy Scale: Poor Sitting balance - Comments: fair/poor seated at EOB Postural control: Posterior lean Standing balance support: Reliant on assistive device for balance, During functional activity, Bilateral upper extremity supported Standing balance-Leahy Scale: Poor Standing balance comment: using RW                             Pertinent Vitals/Pain Pain Assessment Pain Assessment: No/denies pain    Home Living Family/patient expects to be discharged to:: Private residence Living Arrangements: Spouse/significant other;Children Available Help at Discharge: Family;Available PRN/intermittently Type of Home: House Home Access: Stairs to enter Entrance Stairs-Rails: Right;Left;Can reach both Entrance Stairs-Number of Steps: 7   Home Layout: One level Home Equipment: Agricultural consultant (2 wheels);Cane - single point      Prior Function Prior Level of Function : Needs assist       Physical Assist : Mobility (physical);ADLs (physical) Mobility (physical): Bed mobility;Transfers;Gait;Stairs   Mobility Comments: household and short distanced community ambulation with cane mostly, does not drive; this was prior to SNF where he has not been mobile per family's report. ADLs Comments: Independent household, assisted for community ADLs by family; this was prior to SNF where he has likely had much assist for ADL's.     Extremity/Trunk Assessment   Upper Extremity Assessment Upper Extremity Assessment: Defer to OT evaluation    Lower Extremity Assessment Lower Extremity Assessment: Generalized weakness    Cervical / Trunk Assessment Cervical / Trunk Assessment: Kyphotic  Communication   Communication Factors Affecting Communication: Reduced clarity of speech    Cognition Arousal: Alert, Lethargic Behavior During Therapy: WFL for tasks  assessed/performed, Flat affect   PT - Cognitive impairments: Safety/Judgement                       PT - Cognition Comments: requireds repeated verbal, tactile cueing for safety Following commands: Impaired Following commands impaired: Follows one step commands with increased time, Follows one step commands inconsistently     Cueing Cueing Techniques: Verbal cues, Tactile cues     General Comments      Exercises     Assessment/Plan    PT Assessment Patient needs continued PT services  PT Problem List Decreased strength;Decreased activity tolerance;Decreased balance;Decreased mobility       PT Treatment Interventions DME instruction;Gait training;Stair training;Functional mobility training;Therapeutic activities;Therapeutic exercise;Balance training;Patient/family education    PT Goals (Current goals can be found in the Care Plan section)  Acute Rehab PT Goals Patient Stated Goal: return home after rehab PT Goal Formulation: With patient/family Time For Goal Achievement: 09/22/23 Potential to Achieve Goals: Good    Frequency Min 3X/week     Co-evaluation               AM-PAC PT "6 Clicks" Mobility  Outcome Measure Help needed turning from your back to your side while in a flat bed without using bedrails?: A Lot Help needed moving from lying on your back to sitting on the side of a flat bed without using bedrails?: A Lot Help needed moving to and from a bed to a chair (including a wheelchair)?:  A Lot Help needed standing up from a chair using your arms (e.g., wheelchair or bedside chair)?: A Lot Help needed to walk in hospital room?: A Lot Help needed climbing 3-5 steps with a railing? : Total 6 Click Score: 11    End of Session   Activity Tolerance: Patient tolerated treatment well;Patient limited by fatigue Patient left: in chair;with call bell/phone within reach;with family/visitor present;with chair alarm set Nurse Communication: Mobility  status PT Visit Diagnosis: Unsteadiness on feet (R26.81);Other abnormalities of gait and mobility (R26.89);Muscle weakness (generalized) (M62.81)    Time: 1610-9604 PT Time Calculation (min) (ACUTE ONLY): 23 min   Charges:   PT Evaluation $PT Eval Moderate Complexity: 1 Mod PT Treatments $Therapeutic Activity: 23-37 mins PT General Charges $$ ACUTE PT VISIT: 1 Visit         1:12 PM, 09/08/23 Walton Guppy, MPT Physical Therapist with [facility] [phone] office [phone] mobile phone

## 2023-09-08 NOTE — Plan of Care (Signed)
  Problem: Acute Rehab PT Goals(only PT should resolve) Goal: Pt Will Go Supine/Side To Sit Outcome: Progressing Flowsheets (Taken 09/08/2023 1314) Pt will go Supine/Side to Sit:  with minimal assist  with moderate assist Goal: Patient Will Transfer Sit To/From Stand Outcome: Progressing Flowsheets (Taken 09/08/2023 1314) Patient will transfer sit to/from stand:  with minimal assist  with moderate assist Goal: Pt Will Transfer Bed To Chair/Chair To Bed Outcome: Progressing Flowsheets (Taken 09/08/2023 1314) Pt will Transfer Bed to Chair/Chair to Bed:  with min assist  with mod assist Goal: Pt Will Ambulate Outcome: Progressing Flowsheets (Taken 09/08/2023 1314) Pt will Ambulate:  25 feet  with moderate assist  with rolling walker   1:14 PM, 09/08/23 Walton Guppy, MPT Physical Therapist with [facility] 336 [phone] office [phone] mobile phone

## 2023-09-09 DIAGNOSIS — J690 Pneumonitis due to inhalation of food and vomit: Secondary | ICD-10-CM | POA: Diagnosis not present

## 2023-09-09 DIAGNOSIS — N401 Enlarged prostate with lower urinary tract symptoms: Secondary | ICD-10-CM | POA: Diagnosis not present

## 2023-09-09 DIAGNOSIS — E1169 Type 2 diabetes mellitus with other specified complication: Secondary | ICD-10-CM | POA: Diagnosis not present

## 2023-09-09 DIAGNOSIS — I10 Essential (primary) hypertension: Secondary | ICD-10-CM | POA: Diagnosis not present

## 2023-09-09 LAB — BASIC METABOLIC PANEL WITH GFR
Anion gap: 11 (ref 5–15)
BUN: 22 mg/dL (ref 8–23)
CO2: 24 mmol/L (ref 22–32)
Calcium: 7.6 mg/dL — ABNORMAL LOW (ref 8.9–10.3)
Chloride: 101 mmol/L (ref 98–111)
Creatinine, Ser: 0.75 mg/dL (ref 0.61–1.24)
GFR, Estimated: >60 mL/min (ref >60)
Glucose, Bld: 123 mg/dL — ABNORMAL HIGH (ref 70–99)
Potassium: 2.7 mmol/L — CL (ref 3.5–5.1)
Sodium: 136 mmol/L (ref 135–145)

## 2023-09-09 LAB — GLUCOSE, CAPILLARY
Glucose-Capillary: 135 mg/dL — ABNORMAL HIGH (ref 70–99)
Glucose-Capillary: 146 mg/dL — ABNORMAL HIGH (ref 70–99)
Glucose-Capillary: 171 mg/dL — ABNORMAL HIGH (ref 70–99)
Glucose-Capillary: 118 mg/dL — ABNORMAL HIGH (ref 70–99)

## 2023-09-09 MED ORDER — POTASSIUM CHLORIDE 10 MEQ/100ML IV SOLN
10.0000 meq | INTRAVENOUS | Status: AC
  Administered 2023-09-09 (×5): 10 meq via INTRAVENOUS
  Filled 2023-09-09 (×4): qty 100

## 2023-09-09 MED ORDER — ENSURE ENLIVE PO LIQD
237.0000 mL | Freq: Two times a day (BID) | ORAL | Status: DC
  Administered 2023-09-09 – 2023-09-10 (×2): 237 mL via ORAL

## 2023-09-09 MED ORDER — LORAZEPAM 2 MG/ML IJ SOLN
1.0000 mg | INTRAMUSCULAR | Status: DC | PRN
  Administered 2023-09-10 – 2023-09-11 (×3): 1 mg via INTRAVENOUS
  Filled 2023-09-09 (×3): qty 1

## 2023-09-09 NOTE — Progress Notes (Signed)
 Progress Note   Patient: Mark Brady WUJ:[id number] DOB: 12/20/1929 DOA: 09/06/2023     3 DOS: the patient was seen and examined on 09/09/2023   Brief hospital admission narrative: As per H&P written by Dr. Sunnie England on 09/06/2023 [name] is a 89 y.o. male with medical history significant of T2DM, hypertension, hyperlipidemia and advanced dementia who presented with respiratory distress.  Recent hospitalization 05/12 to 08/29/23 after a mechanical fall, resulting in rib fractures. He was discharged to SNF with a foley catheter due to urinary retention.  Most information is from his wife at the bedside, patient is not communicative.  Apparently patient loss his consciousness after aspirating a milkshake. EMS was called and he got about 10 ml of white milkshake cleared from his upper airway. Patient recovered his consciousness and was transported to the ED. His 02 saturation was 62% and required 15 L per non re breather mask to increase to 92%.    At the time of my examination patient is not verbal, he has mittens in place and appears in pain.  His foley was repositioned after patient pulling the tubing.    Per his wife patient's dementia has been progressively worsening since his initial hospitalization on 08/21/23 and posterior transfer to SNF.  He has been having hallucinations and has been fully dependent for his daily activities of daily living.    Assessment and plan Aspiration pneumonia of right lower lobe (HCC) -Complicated with severe sepsis present on admission.  - Continue current IV antibiotics - Patient currently off oxygen supplementation with good saturation. - Follow culture results and follow-up recommendations by speech therapy - Long-term prognosis is poor/guarded. - Patient already DNR/DNI. - Appreciate assistance and recommendation by speech therapy. - Planning to transition to oral antibiotic using Augmentin on 09/10/2023.  Hypokalemia/Hyponatremia.  -  Continue to maintain adequate hydration - Continue to follow ultralights trend. - Will continue telemetry monitoring for now. - Magnesium level within normal limits. - Patient's potassium 2.7 on today's examination; continue repletion.  Essential hypertension, benign - Blood pressure overall stable - Continue to hold antihypertensive agents at the moment with concerns for high risk hypotension in the setting of sepsis. - Follow vital signs.  Type 2 diabetes mellitus with hyperlipidemia (HCC) -Continue sliding scale insulin .  Parkinson's disease (HCC) -Advanced dementia. Acute metabolic encephalopathy due to pneumonia with acute respiratory failure.  - Continue Sinemet   Rib fractures -Overall with poor prognosis - Continue as needed analgesics.  BPH (benign prostatic hyperplasia)/urinary retention -Continue foley catheter in place, it has been repositioned in the ED.  -Follow culture result - Continue current antibiotics - Continue supportive care. - During recent hospitalization patient failed voiding trial.   Subjective:  No fever, no oxygen requirement; following commands appropriately and in no acute distress.  Overall feels better.  Chronically ill in appearance.  No overnight events.  Physical Exam: Vitals:   09/08/23 1800 09/08/23 2025 09/09/23 0440 09/09/23 1450  BP: (!) 151/114 (!) 156/74 (!) 150/76 102/71  Pulse:  97 84 71  Resp:  16 14   Temp: 97.8 F (36.6 C) 98.2 F (36.8 C) 98.2 F (36.8 C) 98.7 F (37.1 C)  TempSrc: Oral Oral Axillary Axillary  SpO2: 100% 98% 94% 95%  Weight:      Height:       General exam: Alert, awake, oriented x 2; following commands appropriately and no requiring oxygen supplementation. Respiratory system: Positive rhonchi appreciated bilaterally; no using accessory muscles. Cardiovascular system: Rate controlled, no  rubs, no gallops, no JVD. Gastrointestinal system: Abdomen is nondistended, soft and nontender. No organomegaly or  masses felt. Normal bowel sounds heard. Central nervous system: Moving 4 limbs spontaneously.  No focal neurological deficits. Extremities: No cyanosis or clubbing. Skin: No petechiae.  Foley catheter in place.  Good urine output and no hematuria appreciated. Psychiatry: Flat affect appreciated.  Latest data Reviewed: Lactic acid: 2.4 >> 1.3 Magnesium: 2.0 Basic metabolic panel: Sodium 136, chloride 101, potassium 2.7, bicarb 24, BUN 22, creatinine 0.75 and GFR >60 CBC: WBCs 15.3, hemoglobin 11.0 platelet count 456K  Family Communication: Daughter updated at bedside.  Disposition: Status is: Inpatient Remains inpatient appropriate because: Continue IV antibiotics.  Anticipate discharge back to skilled nursing facility once medically stable.  Patient is medically ready for discharge on 09/10/2023  Time spent: 50 minutes  Author: Justina Oman, MD 09/09/2023 6:05 PM  For on call review [email or web address].

## 2023-09-09 NOTE — TOC Progression Note (Signed)
 Transition of Care [facility]) - Progression Note    Patient Details  Name: Mark Brady MRN: [id number] Date of Birth: 13-Aug-1929  [facility] CM/SW Contact  Ander Katos, [place] Phone Number: 09/09/2023, 10:02 AM  Clinical Narrative:  Pt from [facility]. Anticipate return. Unable to reach admissions today due to holiday. CMA to start authorization. TOC will follow.      Expected Discharge Plan: Skilled Nursing Facility Barriers to Discharge: Continued Medical Work up  Expected Discharge Plan and Services     Post Acute Care Choice: Skilled Nursing Facility Living arrangements for the past 2 months: Skilled Nursing Facility                                       Social Determinants of Health (SDOH) Interventions SDOH Screenings   Food Insecurity: No Food Insecurity (09/07/2023)  Housing: Low Risk  (09/07/2023)  Transportation Needs: No Transportation Needs (09/07/2023)  Utilities: Not At Risk (09/07/2023)  Alcohol Screen: Low Risk  (05/17/2023)  Depression (PHQ2-9): Low Risk  (05/29/2023)  Financial Resource Strain: Low Risk  (05/17/2023)  Physical Activity: Insufficiently Active (05/17/2023)  Social Connections: Unknown (09/07/2023)  Stress: No Stress Concern Present (05/17/2023)  Tobacco Use: Low Risk  (09/06/2023)  Health Literacy: Patient Declined (05/17/2023)    Readmission Risk Interventions    08/29/2023   10:10 AM  Readmission Risk Prevention Plan  Transportation Screening Complete  PCP or Specialist Appt within 3-5 Days Complete  HRI or Home Care Consult Complete  Social Work Consult for Recovery Care Planning/Counseling Complete  Palliative Care Screening Not Applicable  Medication Review Oceanographer) Complete

## 2023-09-09 NOTE — Progress Notes (Signed)
 Patient tolerating Dysphagia III diet with nectar liquids well, however he will not each much before he says " That's enough " , he did however consume almost all of his ensure. Patient turned and repositioned for comfort.

## 2023-09-09 NOTE — Progress Notes (Signed)
 Date and time results received: 09/09/23 0859  (use smartphrase ".now" to insert current time)  Test: cbc Critical Value:  potassium 2.7  Name of Provider Notified: Michaelene Admire  Orders Received? Or Actions Taken?: awaiting orders

## 2023-09-09 NOTE — Evaluation (Signed)
 Clinical/Bedside Swallow Evaluation Patient Details  Name: Mark Brady MRN: [id number] Date of Birth: [birth year]  Today's Date: 09/09/2023 Time: SLP Start Time (ACUTE ONLY): 1047 SLP Stop Time (ACUTE ONLY): 1117 SLP Time Calculation (min) (ACUTE ONLY): 30 min  Past Medical History:  Past Medical History:  Diagnosis Date   Arthritis    Diabetes mellitus without complication (HCC)    diet controlled   History of elevated PSA    Hyperlipidemia    Hypertension    Stroke [facility])    Past Surgical History:  Past Surgical History:  Procedure Laterality Date   CATARACT EXTRACTION W/PHACO Right 01/26/2014   Procedure: CATARACT EXTRACTION PHACO AND INTRAOCULAR LENS PLACEMENT (IOC);  Surgeon: Lavonia Powers T. Gennie Kicks, MD;  Location: [facility];  Service: Ophthalmology;  Laterality: Right;  CDE 6.53   CATARACT EXTRACTION W/PHACO Left 02/09/2014   Procedure: CATARACT EXTRACTION PHACO AND INTRAOCULAR LENS PLACEMENT (IOC);  Surgeon: Lavonia Powers T. Gennie Kicks, MD;  Location: [facility];  Service: Ophthalmology;  Laterality: Left;  CDE:5.58   COLONOSCOPY     YAG LASER APPLICATION Right 12/07/2014   Procedure: YAG LASER APPLICATION;  Surgeon: Albert Huff, MD;  Location: [facility];  Service: Ophthalmology;  Laterality: Right;   HPI:  As per H&P written by Dr. Sunnie England on 09/06/2023  Mark Brady is a 89 y.o. male with medical history significant of T2DM, hypertension, hyperlipidemia and advanced dementia who presented with respiratory distress.   Recent hospitalization 05/12 to 08/29/23 after a mechanical fall, resulting in rib fractures. He was discharged to SNF with a foley catheter due to urinary retention.   Most information is from his wife at the bedside, patient is not communicative.   Apparently patient loss his consciousness after aspirating a milkshake. EMS was called and he got about 10 ml of white milkshake cleared from his upper airway. Patient recovered his consciousness and was transported to the ED. His 02  saturation was 62% and required 15 L per non re breather mask to increase to 92%. Per his wife patient's dementia has been progressively worsening since his initial hospitalization on 08/21/23 and posterior transfer to SNF.   He has been having hallucinations and has been fully dependent for his daily activities of daily living. Pt was seen by SLP for outpatient Parkinson's evaluation in 2022. BSE requested. CT CHEST:     1. No central pulmonary embolism. Motion obscures the segmental and  subsegmental pulmonary arteries.  2. Patchy peribronchovascular consolidation in the lower lungs  greatest in the right lower lobe, likely aspiration pneumonia.  3. Trace right pleural effusion.    Assessment / Plan / Recommendation  Clinical Impression  Clinical swallow evaluation completed at bedside with Pt's wife present. His wife indicated that he consumed regular textures and thin liquids prior to his fall in early May when he was living at home. She did stated that he often coughed when drinking water, but not when drinking milk and coffee. Oral motor examination reveals no gross facial asymmetry, Pt breathes through his mouth at rest and when sleeping. Pt assessed with ice chips, thin water via cup/straw, NTL, puree and mech soft textures. Pt with poor lip closure around the cup, but achieved more automatic closure with straw sips (wife says he often uses a straw). Pt with occasional cough with thin liquids seemingly due to reduced oral control and likely delay in swallow trigger. Pt with improved performance clinically with straw sips. Pt cued to cough periodically and voice "ahh", which both appeared strong.  Recommend D3/mech soft and NTL via straw sips, PO medication crushed as able in puree or whole, OK for ice chips/small sips of thin water between meals after oral care for comfort. Please include Magic Cups on tray due to limited PO intake. Will consider MBSS and follow Pt in acute setting. MD/RN aware. SLP  Visit Diagnosis: Dysphagia, unspecified (R13.10)    Aspiration Risk  Mild aspiration risk;Risk for inadequate nutrition/hydration    Diet Recommendation Dysphagia 3 (Mech soft);Nectar-thick liquid;Ice chips PRN after oral care;Free water protocol after oral care    Liquid Administration via: Straw Medication Administration: Crushed with puree Supervision: Staff to assist with self feeding;Full supervision/cueing for compensatory strategies Compensations: Slow rate;Small sips/bites;Multiple dry swallows after each bite/sip;Clear throat intermittently Postural Changes: Seated upright at 90 degrees;Remain upright for at least 30 minutes after po intake    Other  Recommendations Oral Care Recommendations: Oral care BID;Oral care prior to ice chip/H20;Staff/trained caregiver to provide oral care    Recommendations for follow up therapy are one component of a multi-disciplinary discharge planning process, led by the attending physician.  Recommendations may be updated based on patient status, additional functional criteria and insurance authorization.  Follow up Recommendations Skilled nursing-short term rehab (<3 hours/day)      Assistance Recommended at Discharge    Functional Status Assessment Patient has had a recent decline in their functional status and demonstrates the ability to make significant improvements in function in a reasonable and predictable amount of time.  Frequency and Duration min 2x/week  1 week       Prognosis Prognosis for improved oropharyngeal function: Fair Barriers to Reach Goals: Severity of deficits      Swallow Study   General Date of Onset: 09/06/23 HPI: As per H&P written by Dr. Sunnie England on 09/06/2023  Mark Brady is a 89 y.o. male with medical history significant of T2DM, hypertension, hyperlipidemia and advanced dementia who presented with respiratory distress.   Recent hospitalization 05/12 to 08/29/23 after a mechanical fall, resulting in rib  fractures. He was discharged to SNF with a foley catheter due to urinary retention.   Most information is from his wife at the bedside, patient is not communicative.   Apparently patient loss his consciousness after aspirating a milkshake. EMS was called and he got about 10 ml of white milkshake cleared from his upper airway. Patient recovered his consciousness and was transported to the ED. His 02 saturation was 62% and required 15 L per non re breather mask to increase to 92%. Per his wife patient's dementia has been progressively worsening since his initial hospitalization on 08/21/23 and posterior transfer to SNF.   He has been having hallucinations and has been fully dependent for his daily activities of daily living. Pt was seen by SLP for outpatient Parkinson's evaluation in 2022. BSE requested. CT CHEST:     1. No central pulmonary embolism. Motion obscures the segmental and  subsegmental pulmonary arteries.  2. Patchy peribronchovascular consolidation in the lower lungs  greatest in the right lower lobe, likely aspiration pneumonia.  3. Trace right pleural effusion. Type of Study: Bedside Swallow Evaluation Previous Swallow Assessment: N/A Diet Prior to this Study: Full liquid diet Temperature Spikes Noted: No Respiratory Status: Room air History of Recent Intubation: No Behavior/Cognition: Alert;Cooperative;Pleasant mood;Confused;Requires cueing Oral Cavity Assessment: Within Functional Limits Oral Care Completed by SLP: Yes Oral Cavity - Dentition: Missing dentition Vision: Impaired for self-feeding Self-Feeding Abilities: Total assist Patient Positioning: Upright in bed Baseline Vocal Quality: Normal  Volitional Cough: Strong Volitional Swallow: Able to elicit    Oral/Motor/Sensory Function Overall Oral Motor/Sensory Function: Within functional limits   Ice Chips Ice chips: Within functional limits Presentation: Spoon   Thin Liquid Thin Liquid: Impaired Presentation: Cup;Straw Oral  Phase Impairments: Reduced labial seal Pharyngeal  Phase Impairments: Cough - Immediate Other Comments: improved oral control with straw sips, but occasional coughing post swallow    Nectar Thick Nectar Thick Liquid: Within functional limits Presentation: Straw   Honey Thick Honey Thick Liquid: Not tested   Puree Puree: Within functional limits Presentation: Spoon   Solid     Solid: Impaired Presentation: Spoon Oral Phase Impairments: Impaired mastication Oral Phase Functional Implications: Prolonged oral transit     Thank you,  Claudetta Cuba, CCC-SLP [phone]  Myrlene Riera 09/09/2023,11:37 AM

## 2023-09-09 NOTE — Evaluation (Signed)
 Occupational Therapy Evaluation Patient Details Name: Mark Brady MRN: [id number] DOB: 1930/02/22 Today's Date: 09/09/2023   History of Present Illness   Mark Brady is a 89 y.o. male with medical history significant of T2DM, hypertension, hyperlipidemia and advanced dementia who presented with respiratory distress.   Recent hospitalization 05/12 to 08/29/23 after a mechanical fall, resulting in rib fractures. He was discharged to SNF with a foley catheter due to urinary retention.   Most information is from his wife at the bedside, patient is not communicative.   Apparently patient loss his consciousness after aspirating a milkshake. EMS was called and he got about 10 ml of white milkshake cleared from his upper airway. Patient recovered his consciousness and was transported to the ED. His 02 saturation was 62% and required 15 L per non re breather mask to increase to 92%.      At the time of my examination patient is not verbal, he has mittens in place and appears in pain.   His foley was repositioned after patient pulling the tubing.      Per his wife patient's dementia has been progressively worsening since his initial hospitalization on 08/21/23 and posterior transfer to SNF.   He has been having hallucinations and has been fully dependent for his daily activities of daily living.     Clinical Impressions Pt agreeable to OT evaluation. Pt's son reports that the pt has not been mobile since being at rehab. Reports pt is assisted for ADL's at SNF. Today pt required max A for bed mobility and max to total assist for many attempts at sit to stand. One attempt successful with the pt able stand for a few seconds without the RW and much support. Pt was able to scoot to the head of the bed with max A while seated at the EOB. R UE has limited A/ROM more than 50%. L UE near 75% of available A/ROM. Pt left in the bed with call bell within reach, mittens in place, and family present. Pt will benefit  from continued OT in the hospital and recommended venue below to increase strength, balance, and endurance for safe ADL's.        If plan is discharge home, recommend the following:   A lot of help with walking and/or transfers;A lot of help with bathing/dressing/bathroom;Assistance with cooking/housework;Assistance with feeding;Assist for transportation;Help with stairs or ramp for entrance;Direct supervision/assist for medications management;Two people to help with walking and/or transfers     Functional Status Assessment   Patient has had a recent decline in their functional status and demonstrates the ability to make significant improvements in function in a reasonable and predictable amount of time.     Equipment Recommendations   None recommended by OT            Precautions/Restrictions   Precautions Precautions: Fall Recall of Precautions/Restrictions: Impaired Restrictions Weight Bearing Restrictions Per Provider Order: No     Mobility Bed Mobility Overal bed mobility: Needs Assistance Bed Mobility: Supine to Sit, Sit to Supine     Supine to sit: Max assist, Used rails Sit to supine: Max assist   General bed mobility comments: increased time, labored movement    Transfers Overall transfer level: Needs assistance Equipment used: Rolling walker (2 wheels) Transfers: Sit to/from Stand Sit to Stand: Max assist, Total assist           General transfer comment: ~5 to 7 reps of attempted sit to stand. Once with RW which was  deemed unsafe. Only one rep were pt stood for more than a few second and that was w/o the RW. Pt actively extending trunk against the transfer despite much cuing to lean forward.      Balance Overall balance assessment: Needs assistance Sitting-balance support: Feet supported, No upper extremity supported Sitting balance-Leahy Scale: Fair Sitting balance - Comments: fair/poor seated at EOB   Standing balance support: Reliant  on assistive device for balance, During functional activity, Bilateral upper extremity supported Standing balance-Leahy Scale: Poor Standing balance comment: using RW and without RW                           ADL either performed or assessed with clinical judgement   ADL Overall ADL's : Needs assistance/impaired Eating/Feeding: Moderate assistance;Maximal assistance;Sitting   Grooming: Maximal assistance;Sitting   Upper Body Bathing: Maximal assistance;Sitting   Lower Body Bathing: Maximal assistance;Total assistance   Upper Body Dressing : Maximal assistance;Sitting   Lower Body Dressing: Maximal assistance;Total assistance;Bed level   Toilet Transfer: Maximal assistance;Total assistance;Rolling walker (2 wheels) Toilet Transfer Details (indicate cue type and reason): Partially simualted via reps of attempted sit to stand. Toileting- Clothing Manipulation and Hygiene: Maximal assistance;Total assistance;Bed level               Vision Baseline Vision/History: 3 Glaucoma Ability to See in Adequate Light: 2 Moderately impaired Patient Visual Report: No change from baseline Additional Comments: baseline deficits     Perception Perception: Not tested       Praxis Praxis: Not tested       Pertinent Vitals/Pain Pain Assessment Pain Assessment: No/denies pain     Extremity/Trunk Assessment Upper Extremity Assessment Upper Extremity Assessment: RUE deficits/detail;LUE deficits/detail RUE Deficits / Details: 2+/5 shoulder flexion; limited A/ROM; able to grasp RW and push from chair. LUE Deficits / Details: 3-/5 shoulder flexion; generally weak.   Lower Extremity Assessment Lower Extremity Assessment: Defer to PT evaluation   Cervical / Trunk Assessment Cervical / Trunk Assessment: Kyphotic   Communication Communication Communication: No apparent difficulties   Cognition Arousal: Alert Behavior During Therapy: Anxious Cognition: History of cognitive  impairments             OT - Cognition Comments: Dementia.                 Following commands: Impaired Following commands impaired: Follows one step commands inconsistently     Cueing  General Comments   Cueing Techniques: Verbal cues;Tactile cues                 Home Living Family/patient expects to be discharged to:: Private residence Living Arrangements: Spouse/significant other;Children Available Help at Discharge: Family;Available PRN/intermittently Type of Home: House Home Access: Stairs to enter [employer] of Steps: 7 Entrance Stairs-Rails: Right;Left;Can reach both Home Layout: One level     Bathroom Shower/Tub: Sponge bathes at baseline;Walk-in shower   Bathroom Toilet: Standard Bathroom Accessibility: Yes   Home Equipment: Agricultural consultant (2 wheels);Cane - single point          Prior Functioning/Environment Prior Level of Function : Needs assist       Physical Assist : Mobility (physical);ADLs (physical) Mobility (physical): Bed mobility;Transfers;Gait;Stairs ADLs (physical): Grooming;Bathing;Dressing;Toileting;IADLs;Feeding Mobility Comments: household and short distanced community ambulation with cane mostly, does not drive; this was prior to SNF where he has not been mobile per family's report. ADLs Comments: Independent household, assisted for community ADLs by family; this was prior to SNF  where he has been dependent.    OT Problem List: Decreased strength;Decreased range of motion;Decreased activity tolerance;Impaired balance (sitting and/or standing);Impaired vision/perception;Decreased cognition;Decreased safety awareness;Decreased knowledge of use of DME or AE;Decreased coordination   OT Treatment/Interventions: Self-care/ADL training;Therapeutic exercise;Therapeutic activities;Patient/family education;Balance training;DME and/or AE instruction;Cognitive remediation/compensation      OT Goals(Current goals can be  found in the care plan section)   Acute Rehab OT Goals Patient Stated Goal: improve function OT Goal Formulation: With family Time For Goal Achievement: 09/23/23 Potential to Achieve Goals: Good   OT Frequency:  Min 2X/week                  AM-PAC OT "6 Clicks" Daily Activity     Outcome Measure Help from another person eating meals?: A Lot Help from another person taking care of personal grooming?: A Lot Help from another person toileting, which includes using toliet, bedpan, or urinal?: A Lot Help from another person bathing (including washing, rinsing, drying)?: A Lot Help from another person to put on and taking off regular upper body clothing?: A Lot Help from another person to put on and taking off regular lower body clothing?: A Lot 6 Click Score: 12   End of Session Equipment Utilized During Treatment: Rolling walker (2 wheels);Gait belt  Activity Tolerance: Patient tolerated treatment well Patient left: in bed;with call bell/phone within reach;with family/visitor present  OT Visit Diagnosis: Unsteadiness on feet (R26.81);Other abnormalities of gait and mobility (R26.89);Muscle weakness (generalized) (M62.81);History of falling (Z91.81);Other symptoms and signs involving cognitive function                Time: 1236-1301 OT Time Calculation (min): 25 min Charges:  OT General Charges $OT Visit: 1 Visit OT Evaluation $OT Eval Low Complexity: 1 Low  Eino Whitner OT, MOT  Thurnell Floss 09/09/2023, 1:18 PM

## 2023-09-09 NOTE — NC FL2 (Signed)
 [place]  MEDICAID FL2 LEVEL OF CARE FORM     IDENTIFICATION  Patient Name: Mark Brady Birthdate: 12/20/1929 Sex: male Admission Date (Current Location): 09/06/2023  [facility] and [place] Number:  [employer] and Address:  [facility],  [address], [place] [address]      Provider Number: [id number]  Attending Physician Name and Address:  Justina Oman, MD  Relative Name and Phone Number:       Current Level of Care: Hospital Recommended Level of Care: Skilled Nursing Facility Prior Approval Number:    Date Approved/Denied:   PASRR Number:    Discharge Plan: SNF    Current Diagnoses: Patient Active Problem List   Diagnosis Date Noted   BPH (benign prostatic hyperplasia) 09/07/2023   Acute respiratory failure (HCC) 09/07/2023   Aspiration pneumonia of right lower lobe (HCC) 09/06/2023   AKI (acute kidney injury) (HCC) 08/27/2023   Acute urinary retention 08/27/2023   Hyponatremia 08/27/2023   Hypokalemia 08/27/2023   Hypertension associated with type 2 diabetes mellitus (HCC) 08/26/2023   Hyperlipidemia associated with type 2 diabetes mellitus (HCC) 08/26/2023   Vitamin B 12 deficiency 08/26/2023   Aortic atherosclerosis (HCC) 08/26/2023   History of Parkinson's disease 08/23/2023   Rib fractures 08/21/2023   Parkinson's disease dementia (HCC) 04/23/2023   Parkinson's disease (HCC) 07/24/2021   Sialorrhea 05/14/2020   Gastroesophageal reflux disease without esophagitis 05/13/2020   Glaucoma 11/23/2019   Type 2 diabetes mellitus with hyperlipidemia (HCC) 10/19/2012   Essential hypertension, benign 09/28/2012   Hyperlipidemia 09/28/2012    Orientation RESPIRATION BLADDER Height & Weight     Self, Place  Normal Indwelling catheter Weight: 170 lb (77.1 kg) Height:  5\' 10"  (177.8 cm)  BEHAVIORAL SYMPTOMS/MOOD NEUROLOGICAL BOWEL NUTRITION STATUS      Continent Diet (See d/c summary.)  AMBULATORY STATUS COMMUNICATION OF NEEDS  Skin   Extensive Assist Verbally Skin abrasions, Bruising                       Personal Care Assistance Level of Assistance  Bathing, Feeding, Dressing Bathing Assistance: Maximum assistance Feeding assistance: Limited assistance Dressing Assistance: Maximum assistance     Functional Limitations Info  Sight, Hearing, Speech Sight Info: Impaired Hearing Info: Impaired Speech Info: Adequate    SPECIAL CARE FACTORS FREQUENCY  PT (By licensed PT)     PT Frequency: 5x weekly              Contractures      Additional Factors Info  Code Status, Allergies Code Status Info: DNR- Limited Allergies Info: Penicillins, Aspirin            Current Medications (09/09/2023):  This is the current hospital active medication list Current Facility-Administered Medications  Medication Dose Route Frequency Provider Last Rate Last Admin   acetaminophen  (TYLENOL ) tablet 650 mg  650 mg Oral Q6H PRN Arrien, Curlee Doss, MD       Or   acetaminophen  (TYLENOL ) suppository 650 mg  650 mg Rectal Q6H PRN Arrien, Curlee Doss, MD       carbidopa -levodopa  (SINEMET  IR) 25-100 MG per tablet immediate release 2 tablet  2 tablet Oral 3 times per day Arrien, Curlee Doss, MD   2 tablet at 09/09/23 0512   cefTRIAXone (ROCEPHIN) 1 g in sodium chloride  0.9 % 100 mL IVPB  1 g Intravenous Q24H Arrien, Mauricio Daniel, MD 200 mL/hr at 09/09/23 0844 1 g at 09/09/23 0844   Chlorhexidine  Gluconate Cloth 2 %  PADS 6 each  6 each Topical Daily Justina Oman, MD   6 each at 09/09/23 0850   cyanocobalamin  (VITAMIN B12) tablet 1,000 mcg  1,000 mcg Oral Daily Arrien, Mauricio Daniel, MD   1,000 mcg at 09/09/23 4098   enoxaparin  (LOVENOX ) injection 40 mg  40 mg Subcutaneous Q24H Arrien, Mauricio Daniel, MD   40 mg at 09/09/23 1191   insulin  aspart (novoLOG ) injection 0-9 Units  0-9 Units Subcutaneous TID WC Arrien, Mauricio Daniel, MD   2 Units at 09/09/23 [phone]   morphine  (PF) 2 MG/ML injection 1 mg  1 mg  Intravenous Q2H PRN Arrien, Mauricio Daniel, MD       ondansetron  (ZOFRAN ) tablet 4 mg  4 mg Oral Q6H PRN Arrien, Mauricio Daniel, MD       Or   ondansetron  (ZOFRAN ) injection 4 mg  4 mg Intravenous Q6H PRN Arrien, Mauricio Daniel, MD       oxyCODONE  (Oxy IR/ROXICODONE ) immediate release tablet 5 mg  5 mg Oral Q6H PRN Arrien, Mauricio Daniel, MD       pantoprazole  (PROTONIX ) EC tablet 40 mg  40 mg Oral Daily Arrien, Curlee Doss, MD   40 mg at 09/09/23 0842   polyethylene glycol (MIRALAX  / GLYCOLAX ) packet 17 g  17 g Oral Daily Arrien, Mauricio Daniel, MD   17 g at 09/09/23 9562   potassium chloride  10 mEq in 100 mL IVPB  10 mEq Intravenous Q1 Hr x 5 Justina Oman, MD       pravastatin  (PRAVACHOL ) tablet 40 mg  40 mg Oral Daily Arrien, Curlee Doss, MD   40 mg at 09/09/23 1308   tamsulosin  (FLOMAX ) capsule 0.4 mg  0.4 mg Oral QPC supper Arrien, Mauricio Daniel, MD         Discharge Medications: Please see discharge summary for a list of discharge medications.  Relevant Imaging Results:  Relevant Lab Results:   Additional Information    Ander Katos, LCSW

## 2023-09-09 NOTE — Plan of Care (Signed)
  Problem: Acute Rehab OT Goals (only OT should resolve) Goal: Pt. Will Perform Eating Flowsheets (Taken 09/09/2023 1324) Pt Will Perform Eating:  with supervision  sitting Goal: Pt. Will Perform Grooming Flowsheets (Taken 09/09/2023 1324) Pt Will Perform Grooming:  with min assist  sitting Goal: Pt. Will Perform Upper Body Dressing Flowsheets (Taken 09/09/2023 1324) Pt Will Perform Upper Body Dressing:  with min assist  sitting Goal: Pt. Will Perform Lower Body Dressing Flowsheets (Taken 09/09/2023 1324) Pt Will Perform Lower Body Dressing: with mod assist Goal: Pt. Will Transfer To Toilet Flowsheets (Taken 09/09/2023 1324) Pt Will Transfer to Toilet:  stand pivot transfer  with min assist Goal: Pt. Will Perform Toileting-Clothing Manipulation Flowsheets (Taken 09/09/2023 1324) Pt Will Perform Toileting - Clothing Manipulation and hygiene:  with mod assist  bed level Goal: Pt/Caregiver Will Perform Home Exercise Program Flowsheets (Taken 09/09/2023 1324) Pt/caregiver will Perform Home Exercise Program:  Increased strength  Increased ROM  Both right and left upper extremity  With minimal assist  Gean Laursen OT, MOT

## 2023-09-10 DIAGNOSIS — N401 Enlarged prostate with lower urinary tract symptoms: Secondary | ICD-10-CM | POA: Diagnosis not present

## 2023-09-10 DIAGNOSIS — J690 Pneumonitis due to inhalation of food and vomit: Secondary | ICD-10-CM | POA: Diagnosis not present

## 2023-09-10 DIAGNOSIS — E1169 Type 2 diabetes mellitus with other specified complication: Secondary | ICD-10-CM | POA: Diagnosis not present

## 2023-09-10 DIAGNOSIS — I10 Essential (primary) hypertension: Secondary | ICD-10-CM | POA: Diagnosis not present

## 2023-09-10 LAB — GLUCOSE, CAPILLARY
Glucose-Capillary: 118 mg/dL — ABNORMAL HIGH (ref 70–99)
Glucose-Capillary: 144 mg/dL — ABNORMAL HIGH (ref 70–99)
Glucose-Capillary: 110 mg/dL — ABNORMAL HIGH (ref 70–99)
Glucose-Capillary: 143 mg/dL — ABNORMAL HIGH (ref 70–99)

## 2023-09-10 MED ORDER — POTASSIUM CHLORIDE 10 MEQ/100ML IV SOLN
10.0000 meq | INTRAVENOUS | Status: AC
  Administered 2023-09-10 (×2): 10 meq via INTRAVENOUS
  Filled 2023-09-10: qty 100

## 2023-09-10 NOTE — Progress Notes (Signed)
 Pt is hallucinating, attempting to get out of bed; fall risk. Very confused, restless, anxious, requiring frequent redirection/reorientation. Minimal effects noted. Dr. Segars notified, new order for prn ativan  noted.

## 2023-09-10 NOTE — Care Management Important Message (Signed)
 Important Message  Patient Details  Name: Mark Brady MRN: [id number] Date of Birth: Nov 25, 1929   Important Message Given:  Yes - Medicare IM     Elsbeth Yearick L Shaquayla Klimas 09/10/2023, 1:52 PM

## 2023-09-10 NOTE — Plan of Care (Signed)

## 2023-09-10 NOTE — Plan of Care (Signed)
  Problem: Coping: Goal: Ability to adjust to condition or change in health will improve Outcome: Not Progressing   Problem: Fluid Volume: Goal: Ability to maintain a balanced intake and output will improve Outcome: Not Progressing   Problem: Health Behavior/Discharge Planning: Goal: Ability to manage health-related needs will improve Outcome: Not Progressing

## 2023-09-10 NOTE — Progress Notes (Signed)
 Progress Note   Patient: Mark Brady:[id number] DOB: 1930/03/15 DOA: 09/06/2023     4 DOS: the patient was seen and examined on 09/10/2023   Brief hospital admission narrative: As per H&P written by Dr. Sunnie England on 09/06/2023 Mark Brady is a 89 y.o. male with medical history significant of T2DM, hypertension, hyperlipidemia and advanced dementia who presented with respiratory distress.  Recent hospitalization 05/12 to 08/29/23 after a mechanical fall, resulting in rib fractures. He was discharged to SNF with a foley catheter due to urinary retention.  Most information is from his wife at the bedside, patient is not communicative.  Apparently patient loss his consciousness after aspirating a milkshake. EMS was called and he got about 10 ml of white milkshake cleared from his upper airway. Patient recovered his consciousness and was transported to the ED. His 02 saturation was 62% and required 15 L per non re breather mask to increase to 92%.    At the time of my examination patient is not verbal, he has mittens in place and appears in pain.  His foley was repositioned after patient pulling the tubing.    Per his wife patient's dementia has been progressively worsening since his initial hospitalization on 08/21/23 and posterior transfer to SNF.  He has been having hallucinations and has been fully dependent for his daily activities of daily living.    Assessment and plan Aspiration pneumonia of right lower lobe (HCC) -Complicated with severe sepsis present on admission.  - Continue current IV antibiotics - Patient currently off oxygen supplementation with good saturation. - Follow culture results and follow-up recommendations by speech therapy - Long-term prognosis is poor/guarded. - Patient already DNR/DNI. - Appreciate assistance and recommendation by speech therapy. - Will attempt transition to Augmentin orally in order to complete treatment for aspiration  pneumonia.  Hypokalemia/Hyponatremia.  - Continue to maintain adequate hydration - Will continue telemetry monitoring for now. - Magnesium level within normal limits. - Will continue to further replete electrolytes and follow trend.  Essential hypertension, benign - Blood pressure overall stable - Continue to hold antihypertensive agents at the moment with concerns for high risk hypotension in the setting of sepsis.  Patient also with limited ability to take oral meds on today's exam. - Continue to follow vital signs.  Type 2 diabetes mellitus with hyperlipidemia (HCC) -Continue sliding scale insulin . - Continue to follow CBG fluctuation.  Parkinson's disease (HCC) -Advanced dementia. Acute metabolic encephalopathy due to pneumonia with acute respiratory failure.  - Continue Sinemet   Rib fractures -Overall with poor prognosis - Continue as needed analgesics.  BPH (benign prostatic hyperplasia)/urinary retention -Continue foley catheter in place, it has been repositioned in the ED.  -Follow culture result - Continue current antibiotics - Continue supportive care. - During recent hospitalization patient failed voiding trial.   Subjective:  No fever, no requiring oxygen supplementation.  Lethargic and difficult to arouse on today's exam.  Physical Exam: Vitals:   09/09/23 1450 09/09/23 2018 09/10/23 0501 09/10/23 1338  BP: 102/71 (!) 142/60 (!) 154/81 130/64  Pulse: 71 87 81 78  Resp:    14  Temp: 98.7 F (37.1 C) 98.6 F (37 C) 98.2 F (36.8 C) 99.9 F (37.7 C)  TempSrc: Axillary Oral Axillary Oral  SpO2: 95% 97% 98% 94%  Weight:      Height:       General exam: Difficult to arouse; per nursing staff demonstrating difficulty taking his meds safely (high risk for aspiration while not fully alert).  Respiratory system: Good saturation on room air; no wheezing or crackles. Cardiovascular system:RRR. No rubs or gallops. Gastrointestinal system: Abdomen is nondistended,  soft and nontender.  Positive bowel. Central nervous system: Generally weak; no focal neurological deficits. Extremities: No cyanosis, clubbing or edema. Skin: No petechiae. Psychiatry: Judgement and insight appear impaired secondary to underlying dementia.  Overall with flat affect.  Limited evaluation secondary to lethargy.  Latest data Reviewed: Lactic acid: 2.4 >> 1.3 Magnesium: 2.0 Basic metabolic panel: Sodium 136, chloride 101, potassium 2.7, bicarb 24, BUN 22, creatinine 0.75 and GFR >60 CBC: WBCs 15.3, hemoglobin 11.0 platelet count 456K  Family Communication: Wife and son at bedside.  Disposition: Status is: Inpatient Remains inpatient appropriate because: Continue IV antibiotics.  Patient with setback on the day we were planning to discharge; experiencing difficulty to be aroused and safely take medications and diet.  Family understands mental up sundowning fluctuation (given underlying dementia/Parkinson's) if condition further stabilized can be discharged to skilled nursing facility on 09/11/2023 with final attempt for rehabilitation and hospice transition if he fails to improve.  If patient condition felt to stabilize family in agreement to transition to hospice and pursued hospice home placement for comfort management.  Time spent: 50 minutes  Author: Justina Oman, MD 09/10/2023 4:19 PM  For on call review [email or web address].

## 2023-09-10 NOTE — Progress Notes (Signed)
 Physical Therapy Treatment Patient Details Name: [name] MRN: [id number] DOB: 1929/12/16 Today's Date: 09/10/2023   History of Present Illness Mark Brady is a 89 y.o. male with medical history significant of T2DM, hypertension, hyperlipidemia and advanced dementia who presented with respiratory distress.   Recent hospitalization 05/12 to 08/29/23 after a mechanical fall, resulting in rib fractures. He was discharged to SNF with a foley catheter due to urinary retention.   Most information is from his wife at the bedside, patient is not communicative.   Apparently patient loss his consciousness after aspirating a milkshake. EMS was called and he got about 10 ml of white milkshake cleared from his upper airway. Patient recovered his consciousness and was transported to the ED. His 02 saturation was 62% and required 15 L per non re breather mask to increase to 92%.      At the time of my examination patient is not verbal, he has mittens in place and appears in pain.   His foley was repositioned after patient pulling the tubing.      Per his wife patient's dementia has been progressively worsening since his initial hospitalization on 08/21/23 and posterior transfer to SNF.   He has been having hallucinations and has been fully dependent for his daily activities of daily living.    PT Comments  Patient was limited today during PT treatment due to decreased arousal level. Patient was received supine in bed with wife and son present. Attempts at increasing arousal at beginning of session with increased volume of voice, sternal rub, and lifting HOB unsuccessful. Family reporting this as a change but believes it may be due to medications. Patient required max A for supine>sit. Arousal level increases slightly once EOB. Patient able to maintain seated balance for ~3 min independently. Attempts at full stand unsuccessful w/ and w/o RW. Patient follows command to grab RW for stand attempts. Max A to  scoot to [facility]. Patient does not open eyes throughout session. Patient returned to bed in chair position, bed alarm set, call bell in reach and family present. Patient will benefit from continued skilled physical therapy acutely and in recommended venue in order to address the above before returning home with adequate support.     If plan is discharge home, recommend the following: A lot of help with bathing/dressing/bathroom;A lot of help with walking and/or transfers;Help with stairs or ramp for entrance;Assistance with cooking/housework   Can travel by private vehicle     No  Equipment Recommendations  None recommended by PT    Recommendations for Other Services       Precautions / Restrictions Precautions Precautions: Fall Recall of Precautions/Restrictions: Impaired Restrictions Weight Bearing Restrictions Per Provider Order: No     Mobility  Bed Mobility Overal bed mobility: Needs Assistance Bed Mobility: Supine to Sit, Sidelying to Sit, Sit to Supine   Sidelying to sit: Max assist Supine to sit: Max assist, Used rails Sit to supine: Max assist   General bed mobility comments: Max A due to pt being difficult to arouse during supine/sidelying>sit. Max A for sit>supine as well but pt more alert, eyes still not opened but states "thats enough" and initiates transfer with verbal cueing.    Transfers Overall transfer level: Needs assistance Equipment used: Rolling walker (2 wheels), None Transfers: Sit to/from Stand Sit to Stand: Max assist, Total assist           General transfer comment: Attempted STS from elevated bed height w/ and w/o RW,  w/ heavy posterior lean. Attempts inc pt's arousal but pt still with no eye opening or contribution with LE. Pt able to follow commands of RUE use to grab RW. Stands ultimately unsuccessful. Max assist to scoot to [facility].    Ambulation/Gait       Stairs       Wheelchair Mobility     Tilt Bed    Modified Rankin (Stroke  Patients Only)       Balance Overall balance assessment: Needs assistance Sitting-balance support: Feet supported, Bilateral upper extremity supported Sitting balance-Leahy Scale: Fair Sitting balance - Comments: Good/fair seated EOB. Pt able to maintain balance independently for ~3 minutes before posterior lean requires A to correct Postural control: Posterior lean Standing balance support: During functional activity Standing balance-Leahy Scale: Poor Standing balance comment: w/ and w/o AD. due to weakness and posterior lean          Communication Communication Communication: Impaired Factors Affecting Communication: Reduced clarity of speech;Difficulty expressing self  Cognition Arousal: Lethargic, Obtunded Behavior During Therapy: Flat affect   PT - Cognitive impairments: Difficult to assess Difficult to assess due to: Level of arousal       PT - Cognition Comments: Repeated verbal and tactile cueing given throughout. Sternal rubbing unsuccessful. Family (wife and son present), pt not aroused to their voice or touch. Following commands: Impaired Following commands impaired: Follows one step commands inconsistently    Cueing Cueing Techniques: Verbal cues, Tactile cues  Exercises      General Comments        Pertinent Vitals/Pain Pain Assessment Pain Assessment: Faces Faces Pain Scale: No hurt    Home Living                          Prior Function            PT Goals (current goals can now be found in the care plan section) Acute Rehab PT Goals Patient Stated Goal: return home after rehab PT Goal Formulation: With patient/family Time For Goal Achievement: 09/22/23 Potential to Achieve Goals: Good Progress towards PT goals: Progressing toward goals    Frequency    Min 3X/week      PT Plan      Co-evaluation     PT goals addressed during session: Mobility/safety with mobility;Balance        AM-PAC PT "6 Clicks" Mobility    Outcome Measure  Help needed turning from your back to your side while in a flat bed without using bedrails?: A Lot Help needed moving from lying on your back to sitting on the side of a flat bed without using bedrails?: A Lot Help needed moving to and from a bed to a chair (including a wheelchair)?: A Lot Help needed standing up from a chair using your arms (e.g., wheelchair or bedside chair)?: A Lot Help needed to walk in hospital room?: A Lot Help needed climbing 3-5 steps with a railing? : Total 6 Click Score: 11    End of Session Equipment Utilized During Treatment: Gait belt Activity Tolerance: Patient limited by fatigue;Patient limited by lethargy Patient left: in bed;with call bell/phone within reach;with bed alarm set;with family/visitor present   PT Visit Diagnosis: Unsteadiness on feet (R26.81);Other abnormalities of gait and mobility (R26.89);Muscle weakness (generalized) (M62.81)     Time: 1145-1200 PT Time Calculation (min) (ACUTE ONLY): 15 min  Charges:    $Therapeutic Activity: 8-22 mins PT General Charges $$ ACUTE PT VISIT: 1 Visit  12:16 PM, 09/10/23 Marysue Sola, PT, DPT [place] [facility]

## 2023-09-11 ENCOUNTER — Encounter (HOSPITAL_COMMUNITY): Payer: Self-pay | Admitting: Internal Medicine

## 2023-09-11 ENCOUNTER — Other Ambulatory Visit: Payer: Self-pay

## 2023-09-11 DIAGNOSIS — Z515 Encounter for palliative care: Secondary | ICD-10-CM

## 2023-09-11 DIAGNOSIS — J690 Pneumonitis due to inhalation of food and vomit: Secondary | ICD-10-CM | POA: Diagnosis not present

## 2023-09-11 DIAGNOSIS — Z7189 Other specified counseling: Secondary | ICD-10-CM

## 2023-09-11 LAB — BASIC METABOLIC PANEL WITH GFR
Anion gap: 6 (ref 5–15)
BUN: 24 mg/dL — ABNORMAL HIGH (ref 8–23)
CO2: 25 mmol/L (ref 22–32)
Calcium: 8.1 mg/dL — ABNORMAL LOW (ref 8.9–10.3)
Chloride: 112 mmol/L — ABNORMAL HIGH (ref 98–111)
Creatinine, Ser: 1.04 mg/dL (ref 0.61–1.24)
GFR, Estimated: >60 mL/min (ref >60)
Glucose, Bld: 116 mg/dL — ABNORMAL HIGH (ref 70–99)
Potassium: 3 mmol/L — ABNORMAL LOW (ref 3.5–5.1)
Sodium: 143 mmol/L (ref 135–145)

## 2023-09-11 LAB — GLUCOSE, CAPILLARY
Glucose-Capillary: 112 mg/dL — ABNORMAL HIGH (ref 70–99)
Glucose-Capillary: 121 mg/dL — ABNORMAL HIGH (ref 70–99)

## 2023-09-11 LAB — MAGNESIUM: Magnesium: 2 mg/dL (ref 1.7–2.4)

## 2023-09-11 LAB — CULTURE, BLOOD (ROUTINE X 2)
Culture: NO GROWTH
Culture: NO GROWTH
Special Requests: ADEQUATE
Special Requests: ADEQUATE

## 2023-09-11 MED ORDER — GLYCOPYRROLATE 1 MG PO TABS: 1.0000 mg | ORAL_TABLET | ORAL | Status: DC | PRN

## 2023-09-11 MED ORDER — ACETAMINOPHEN 325 MG PO TABS
650.0000 mg | ORAL_TABLET | Freq: Four times a day (QID) | ORAL | Status: DC | PRN
Start: 2023-09-11 — End: 2023-09-13

## 2023-09-11 MED ORDER — BIOTENE DRY MOUTH MT LIQD: 15.0000 mL | OROMUCOSAL | Status: DC | PRN

## 2023-09-11 MED ORDER — MORPHINE SULFATE (PF) 2 MG/ML IV SOLN
1.0000 mg | INTRAVENOUS | Status: DC | PRN
  Administered 2023-09-11 – 2023-09-12 (×3): 2 mg via INTRAVENOUS
  Filled 2023-09-11 (×3): qty 1

## 2023-09-11 MED ORDER — GLYCOPYRROLATE 0.2 MG/ML IJ SOLN
0.2000 mg | INTRAMUSCULAR | Status: DC | PRN
  Administered 2023-09-11 – 2023-09-12 (×4): 0.2 mg via INTRAVENOUS
  Filled 2023-09-11 (×4): qty 1

## 2023-09-11 MED ORDER — ONDANSETRON HCL 4 MG/2ML IJ SOLN: 4.0000 mg | Freq: Four times a day (QID) | INTRAMUSCULAR | Status: DC | PRN

## 2023-09-11 MED ORDER — ONDANSETRON 4 MG PO TBDP: 4.0000 mg | ORAL_TABLET | Freq: Four times a day (QID) | ORAL | Status: DC | PRN

## 2023-09-11 MED ORDER — SODIUM CHLORIDE 0.9% FLUSH
3.0000 mL | Freq: Two times a day (BID) | INTRAVENOUS | Status: DC
  Administered 2023-09-11 – 2023-09-12 (×2): 3 mL via INTRAVENOUS

## 2023-09-11 MED ORDER — POLYVINYL ALCOHOL 1.4 % OP SOLN: 1.0000 [drp] | Freq: Four times a day (QID) | OPHTHALMIC | Status: DC | PRN

## 2023-09-11 MED ORDER — SODIUM CHLORIDE 0.9% FLUSH: 3.0000 mL | INTRAVENOUS | Status: DC | PRN

## 2023-09-11 MED ORDER — POTASSIUM CHLORIDE 10 MEQ/100ML IV SOLN
10.0000 meq | INTRAVENOUS | Status: AC
  Administered 2023-09-11 (×4): 10 meq via INTRAVENOUS
  Filled 2023-09-11 (×2): qty 100

## 2023-09-11 MED ORDER — LORAZEPAM 2 MG/ML IJ SOLN
1.0000 mg | INTRAMUSCULAR | Status: DC | PRN
  Administered 2023-09-11 – 2023-09-12 (×2): 2 mg via INTRAVENOUS
  Administered 2023-09-12 (×2): 1 mg via INTRAVENOUS
  Filled 2023-09-11 (×4): qty 1

## 2023-09-11 MED ORDER — GLYCOPYRROLATE 0.2 MG/ML IJ SOLN: 0.2000 mg | INTRAMUSCULAR | Status: DC | PRN

## 2023-09-11 MED ORDER — ACETAMINOPHEN 650 MG RE SUPP: 650.0000 mg | Freq: Four times a day (QID) | RECTAL | Status: DC | PRN

## 2023-09-11 NOTE — Progress Notes (Signed)
 Occupational Therapy Treatment Patient Details Name: Mark Brady MRN: [id number] DOB: August 05, 1929 Today's Date: 09/11/2023   History of present illness Mark Brady is a 89 y.o. male with medical history significant of T2DM, hypertension, hyperlipidemia and advanced dementia who presented with respiratory distress.   Recent hospitalization 05/12 to 08/29/23 after a mechanical fall, resulting in rib fractures. He was discharged to SNF with a foley catheter due to urinary retention.   Most information is from his wife at the bedside, patient is not communicative.   Apparently patient loss his consciousness after aspirating a milkshake. EMS was called and he got about 10 ml of white milkshake cleared from his upper airway. Patient recovered his consciousness and was transported to the ED. His 02 saturation was 62% and required 15 L per non re breather mask to increase to 92%.      At the time of my examination patient is not verbal, he has mittens in place and appears in pain.   His foley was repositioned after patient pulling the tubing.      Per his wife patient's dementia has been progressively worsening since his initial hospitalization on 08/21/23 and posterior transfer to SNF.   He has been having hallucinations and has been fully dependent for his daily activities of daily living.   OT comments  Pt agreeable to OT and PT treatment. Pt lethargic initially but arousal improved after transfer to the chair. Pt able to complete a step pivot transfer with +2 assist with max to total assist as well. Pt used RW and was able to take a couple steps to the chair. Pt was able to wash his face with set up assist while seated in the chair. Pt had more difficulty following commands for applying deodorant and socks which were both discontinued due to cognitive difficulty. Pt left in the chair with call bell within reach and family and nursing present. Pt will benefit from continued OT in the hospital and  recommended venue below to increase strength, balance, and endurance for safe ADL's.         If plan is discharge home, recommend the following:  A lot of help with walking and/or transfers;A lot of help with bathing/dressing/bathroom;Assistance with cooking/housework;Assistance with feeding;Assist for transportation;Help with stairs or ramp for entrance;Direct supervision/assist for medications management;Two people to help with walking and/or transfers   Equipment Recommendations  None recommended by OT          Precautions / Restrictions Precautions Precautions: Fall Recall of Precautions/Restrictions: Impaired Restrictions Weight Bearing Restrictions Per Provider Order: No       Mobility Bed Mobility Overal bed mobility: Needs Assistance Bed Mobility: Supine to Sit     Supine to sit: Max assist     General bed mobility comments: Pt lethargic and needing max A to sit upright in bed.    Transfers Overall transfer level: Needs assistance Equipment used: Rolling walker (2 wheels), None Transfers: Sit to/from Stand, Bed to chair/wheelchair/BSC Sit to Stand: Total assist, Max assist, +2 physical assistance     Step pivot transfers: Max assist, +2 physical assistance, Total assist     General transfer comment: Pt able to achieving standing on the first attempt with RW and +2 assist with a therapist on either side. Pt was then able to step to the chair with more max A using the RW.     Balance Overall balance assessment: Needs assistance Sitting-balance support: Feet supported, Bilateral upper extremity supported Sitting balance-Leahy Scale: Fair  Sitting balance - Comments: seated at EOB Postural control: Posterior lean Standing balance support: During functional activity, Bilateral upper extremity supported, Reliant on assistive device for balance Standing balance-Leahy Scale: Poor Standing balance comment: using RW                           ADL either  performed or assessed with clinical judgement   ADL Overall ADL's : Needs assistance/impaired     Grooming: Set up;Wash/dry face;Sitting Grooming Details (indicate cue type and reason): Pt able to wash his face with set up assist while seated in the chair. Attempted for pt to apply deodorant but this was discontinued due to pt struggling to follow the command. Pt initially brought the deodorant to his mouth while seated and had to be redirected.             Lower Body Dressing: Maximal assistance;Total assistance;Sitting/lateral leans Lower Body Dressing Details (indicate cue type and reason): Attempted to have pt doff a sock seated at EOB but the pt attempted to remove his shirt rather than his sock despite tactile cuing. Attempt discontinued.                    Extremity/Trunk Assessment Upper Extremity Assessment Upper Extremity Assessment: RUE deficits/detail;LUE deficits/detail RUE Deficits / Details: 2+/5 shoulder flexion continues LUE Deficits / Details: 3-/5 shoulder flexion; near full P/ROM for shoulder flexion; mild limitation in last 25% of range.  generally weak.   Lower Extremity Assessment Lower Extremity Assessment: Defer to PT evaluation                         Communication Communication Communication: Impaired Factors Affecting Communication: Reduced clarity of speech;Difficulty expressing self (soft spoken today)   Cognition Arousal: Lethargic Behavior During Therapy: Anxious (upset at times) Cognition: History of cognitive impairments             OT - Cognition Comments: Dementia.                 Following commands: Impaired Following commands impaired: Follows one step commands inconsistently      Cueing   Cueing Techniques: Verbal cues, Tactile cues  Exercises                   Pertinent Vitals/ Pain       Pain Assessment Pain Assessment: Faces Faces Pain Scale: No hurt                                                           Frequency  Min 2X/week        Progress Toward Goals  OT Goals(current goals can now be found in the care plan section)  Progress towards OT goals: Progressing toward goals  Acute Rehab OT Goals Patient Stated Goal: improve function OT Goal Formulation: With family Time For Goal Achievement: 09/23/23 Potential to Achieve Goals: Good ADL Goals Pt Will Perform Eating: with supervision;sitting Pt Will Perform Grooming: with min assist;sitting Pt Will Perform Upper Body Dressing: with min assist;sitting Pt Will Perform Lower Body Dressing: with mod assist Pt Will Transfer to Toilet: stand pivot transfer;with min assist Pt Will Perform Toileting - Clothing Manipulation and hygiene: with mod assist;bed level Pt/caregiver will  Perform Home Exercise Program: Increased strength;Increased ROM;Both right and left upper extremity;With minimal assist  Plan      Co-evaluation    PT/OT/SLP Co-Evaluation/Treatment: Yes Reason for Co-Treatment: To address functional/ADL transfers;Complexity of the patient's impairments (multi-system involvement)   OT goals addressed during session: ADL's and self-care                          End of Session Equipment Utilized During Treatment: Rolling walker (2 wheels);Gait belt  OT Visit Diagnosis: Unsteadiness on feet (R26.81);Other abnormalities of gait and mobility (R26.89);Muscle weakness (generalized) (M62.81);History of falling (Z91.81);Other symptoms and signs involving cognitive function   Activity Tolerance Patient tolerated treatment well   Patient Left in bed;with call bell/phone within reach;with nursing/sitter in room;with family/visitor present   Nurse Communication Other (comment) (RN in room at the end of session.)        Time: [phone] OT Time Calculation (min): 23 min  Charges: OT General Charges $OT Visit: 1 Visit OT Treatments $Self Care/Home Management : 8-22  mins  [facility] OT, MOT  Thurnell Floss 09/11/2023, 9:32 AM

## 2023-09-11 NOTE — Consult Note (Signed)
 Consultation Note Date: 09/11/2023   Patient Name: Mark Brady  DOB: 1929-12-05  MRN: [id number]  Age / Sex: 89 y.o., male  PCP: Mark Ast, DO Referring Physician: Cornelius Dill, DO  Reason for Consultation: Establishing goals of care  HPI/Patient Profile: 89 y.o. male  with past medical history of T2DM, hypertension, hyperlipidemia and advanced dementia admitted on 09/06/2023 with aspiration pneumonia, advancing dementia.   Clinical Assessment and Goals of Care: I have reviewed medical records including EPIC notes, labs and imaging, received report from RN, assessed the patient.  Mark Brady is lying quietly in bed.  He appears acutely/chronically ill and quite frail.  He will briefly open his eyes, but not make eye contact.  He has known dementia therefore I do not ask orientation questions.  I am not sure that he can make his basic needs known.  His son, Mark Brady, is present at bedside.  Mark Brady shares that Mark Brady wife, Mark Brady, has stepped out for a few minutes.  Face-to-face discussion with bedside nursing staff.  I returned later in the day to find Mark Brady and Mark Brady at bedside.   We meet at the bedside to discuss diagnosis prognosis, GOC, EOL wishes, disposition and options. I introduced Palliative Medicine as specialized medical care for people living with serious illness. It focuses on providing relief from the symptoms and stress of a serious illness. The goal is to improve quality of life for both the patient and the family.  We discussed a brief life review of the patient.  [name] had been married since 10/12/1997.  His first wife died in 21.  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  His daughter died at [facility] house in 10/13/06.  He has had marked declines over the last few months.  We then focused on their current illness.  We talked about Mark Brady and his declines over the last few  months, weeks in particular.  We talked about what we can and cannot change.  We talked about the chronic illness pathway, what is normal and expected.  We talked about comfort and dignity, let nature take its course.  The natural disease trajectory and expectations at EOL were discussed.  I attempted to elicit values and goals of care important to the patient.  The difference between aggressive medical intervention and comfort care was considered in light of the patient's goals of care.  Mark Brady shares that although she would like to focus on her needs, her wants for him to continue to be with her, she is reminded that it is more important to focus on her husband.  Advanced directives, concepts specific to code status, artifical feeding and hydration, and rehospitalization were considered and discussed.  DNR/comfort care.  Hospice Care services outpatient were explained and offered.  We talk in detail about hospice care, what is and is not provided at home, and that residential hospice.  Generalities are discussed and I shared that hospice provider will give specifics.  Provider choice discussed, family chooses [facility].  Mark Brady asks appropriate questions.  She shares that she is considering taking him home, but realizes that she can transition to residential hospice if/when needed.   Discussed the importance of continued conversation with family and the medical providers regarding overall plan of care and treatment options, ensuring decisions are within the context of the patient's values and GOCs.  Questions and concerns were addressed.  Hard Choices booklet left for review. The family was encouraged to call with questions or concerns.  PMT will continue to support holistically.  Conference with attending, bedside nursing staff, transition of care team, nursing tech, speech therapy, [facility] social worker related to patient condition, needs, goals of care, disposition.   HCPOA  HCPOA -wife, Mark Brady    SUMMARY OF RECOMMENDATIONS   Full comfort care End-of-life order set implemented   Code Status/Advance Care Planning: DNR/comfort care  Symptom Management:  End-of-life order set implemented  Palliative Prophylaxis:  Frequent Pain Assessment, Oral Care, and Turn Reposition  Additional Recommendations (Limitations, Scope, Preferences): Full Comfort Care  Psycho-social/Spiritual:  Desire for further Chaplaincy support:yes Additional Recommendations: Caregiving  Support/Resources, Education on Hospice, and Grief/Bereavement Support  Prognosis:  < 2 weeks anticipated based on acute illness, family's desire to focus on comfort and dignity, let nature take its course chronic illness burden, decreased functional status, poor by mouth intake for the last 2 days.  Discharge Planning: Requesting comfort and dignity at end-of-life, residential hospice at [facility] house      Primary Diagnoses: Present on Admission:  Aspiration pneumonia of right lower lobe (HCC)  Essential hypertension, benign  Type 2 diabetes mellitus with hyperlipidemia (HCC)  Parkinson's disease (HCC)  Rib fractures  Hypokalemia  Acute respiratory failure (HCC)   I have reviewed the medical record, interviewed the patient and family, and examined the patient. The following aspects are pertinent.  Past Medical History:  Diagnosis Date   Arthritis    Diabetes mellitus without complication (HCC)    diet controlled   History of elevated PSA    Hyperlipidemia    Hypertension    Stroke [facility])    Social History   Socioeconomic History   Marital status: Married    Spouse name: Mark Brady   Number of children: 5   Years of education: Not on file   Highest education level: Not on file  Occupational History   Occupation: retired    Comment: tobacco and beef farmer  Tobacco Use   Smoking status: Never   Smokeless tobacco: Never  Substance and Sexual Activity   Alcohol use: No   Drug use: No    Sexual activity: Yes    Birth control/protection: None  Other Topics Concern   Not on file  Social History Narrative   3children, all live nearby.   Grandchildren and great grandchildren.      Right Handed    Social Drivers of Health   Financial Resource Strain: Low Risk  (05/17/2023)   Overall Financial Resource Strain (CARDIA)    Difficulty of Paying Living Expenses: Not hard at all  Food Insecurity: No Food Insecurity (09/07/2023)   Hunger Vital Sign    Worried About Running Out of Food in the Last Year: Never true    Ran Out of Food in the Last Year: Never true  Transportation Needs: No Transportation Needs (09/07/2023)   PRAPARE - Administrator, Civil Service (Medical): No    Lack of Transportation (Non-Medical): No  Physical Activity: Insufficiently Active (05/17/2023)   Exercise Vital  Sign    Days of Exercise per Week: 3 days    Minutes of Exercise per Session: 30 min  Stress: No Stress Concern Present (05/17/2023)   [employer] of Occupational Health - Occupational Stress Questionnaire    Feeling of Stress : Not at all  Social Connections: Unknown (09/07/2023)   Social Connection and Isolation Panel [NHANES]    Frequency of Communication with Friends and Family: Patient unable to answer    Frequency of Social Gatherings with Friends and Family: Patient unable to answer    Attends Religious Services: Patient unable to answer    Active Member of Clubs or Organizations: Patient unable to answer    Attends Banker Meetings: Patient unable to answer    Marital Status: Married   Family History  Problem Relation Age of Onset   Arthritis Other    Colon cancer Child    Heart failure Child    Scheduled Meds:  carbidopa -levodopa   2 tablet Oral 3 times per day   Chlorhexidine  Gluconate Cloth  6 each Topical Daily   cyanocobalamin   1,000 mcg Oral Daily   enoxaparin  (LOVENOX ) injection  40 mg Subcutaneous Q24H   feeding supplement  237 mL Oral BID  BM   insulin  aspart  0-9 Units Subcutaneous TID WC   pantoprazole   40 mg Oral Daily   polyethylene glycol  17 g Oral Daily   pravastatin   40 mg Oral Daily   tamsulosin   0.4 mg Oral QPC supper   Continuous Infusions:  cefTRIAXone (ROCEPHIN)  IV Stopped (09/10/23 1017)   potassium chloride  10 mEq (09/11/23 0957)   PRN Meds:.acetaminophen  **OR** acetaminophen , LORazepam , morphine  injection, ondansetron  **OR** ondansetron  (ZOFRAN ) IV, oxyCODONE  Medications Prior to Admission:  Prior to Admission medications   Medication Sig Start Date End Date Taking? Authorizing Provider  acetaminophen  (TYLENOL ) 500 MG tablet Take 2 tablets (1,000 mg total) by mouth every 8 (eight) hours as needed for mild pain (pain score 1-3) or headache (or Fever >/= 101). 08/23/23  Yes Justina Oman, MD  amLODipine  (NORVASC ) 10 MG tablet Take 1 tablet (10 mg total) by mouth daily. 07/29/23  Yes Cook, Jayce G, DO  carbidopa -levodopa  (SINEMET  IR) 25-100 MG tablet Take 2 tablets by mouth 3 (three) times daily. 7am/11am/4pm Patient taking differently: Take 2 tablets by mouth 3 (three) times daily. 6am/11am/4pm 07/09/23  Yes Tat, Von Grumbling, DO  cyanocobalamin  2000 MCG tablet Take 1,000 mcg by mouth daily.   Yes [provider]  dorzolamide -timolol  (COSOPT ) 22.3-6.8 MG/ML ophthalmic solution Place 1 drop into both eyes daily.   Yes [provider]  latanoprost  (XALATAN ) 0.005 % ophthalmic solution Place 1 drop into both eyes at bedtime. 04/30/22  Yes [provider]  lidocaine  (LIDODERM ) 5 % Place 1 patch onto the skin daily. Remove & Discard patch within 12 hours; apply to affected area (right sided costal zone) 08/24/23  Yes Justina Oman, MD  losartan  (COZAAR ) 100 MG tablet TAKE 1 TABLET(100 MG) BY MOUTH DAILY Patient taking differently: Take 100 mg by mouth daily. 06/26/23  Yes Cook, Jayce G, DO  omeprazole  (PRILOSEC) 40 MG capsule Take 40 mg by mouth daily.   Yes [provider]  polyethylene  glycol (MIRALAX  / GLYCOLAX ) 17 g packet Take 17 g by mouth daily. 08/30/23  Yes Emokpae, Courage, MD  pravastatin  (PRAVACHOL ) 40 MG tablet TAKE 1 TABLET(40 MG) BY MOUTH DAILY Patient taking differently: Take 40 mg by mouth daily. 06/26/23  Yes Cook, Jayce G, DO  senna-docusate (SENOKOT-S)  8.6-50 MG tablet Take 2 tablets by mouth at bedtime. 08/29/23  Yes Emokpae, Courage, MD  tamsulosin  (FLOMAX ) 0.4 MG CAPS capsule Take 1 capsule (0.4 mg total) by mouth daily after supper. 08/29/23  Yes Colin Dawley, MD  Accu-Chek Softclix Lancets lancets USE TO CHECK BLOOD SUGAR DAILY Patient not taking: Reported on 09/07/2023 01/07/23   Cook, Jayce G, DO  glucose blood (ACCU-CHEK AVIVA PLUS) test strip USE TO CHECK BLOOD SUGAR DAILY Patient not taking: Reported on 09/07/2023 05/24/22   Cook, Jayce G, DO   Allergies  Allergen Reactions   Penicillins Other (See Comments)    Unknown, no reaction listed on MAR   Aspirin  Rash    No reaction listed on MAR for verification   Review of Systems  Unable to perform ROS: Dementia    Physical Exam Vitals and nursing note reviewed.  Constitutional:      Appearance: He is ill-appearing.  HENT:     Mouth/Throat:     Mouth: Mucous membranes are dry.  Cardiovascular:     Rate and Rhythm: Normal rate.  Pulmonary:     Effort: Pulmonary effort is normal. No respiratory distress.  Skin:    General: Skin is warm and dry.     Coloration: Skin is pale.     Vital Signs: BP (!) 146/63 (BP Location: Right Arm)   Pulse 83   Temp 99 F (37.2 C) (Axillary)   Resp 17   Ht 5\' 10"  (1.778 m)   Wt 77.1 kg   SpO2 98%   BMI 24.39 kg/m  Pain Scale: 0-10   Pain Score: 0-No pain   SpO2: SpO2: 98 % O2 Device:SpO2: 98 % O2 Flow Rate: .O2 Flow Rate (L/min): 3 L/min  IO: Intake/output summary:  Intake/Output Summary (Last 24 hours) at 09/11/2023 1002 Last data filed at 09/11/2023 0503 Gross per 24 hour  Intake 303.22 ml  Output 650 ml  Net -346.78 ml    LBM: Last BM  Date : (P)  (PTA) Baseline Weight: Weight: 77.1 kg Most recent weight: Weight: 77.1 kg     Palliative Assessment/Data:     Time In: 1300  Time Out: 1415 Time Total: 75 minutes  Greater than 50%  of this time was spent counseling and coordinating care related to the above assessment and plan.  Signed by: Annabelle Barrack, NP   Please contact Palliative Medicine Team phone at [phone] for questions and concerns.  For individual provider: See Tilford Foley

## 2023-09-11 NOTE — Plan of Care (Signed)
   Problem: Skin Integrity: Goal: Risk for impaired skin integrity will decrease Outcome: Progressing   Problem: Activity: Goal: Risk for activity intolerance will decrease Outcome: Progressing   Problem: Coping: Goal: Level of anxiety will decrease Outcome: Progressing

## 2023-09-11 NOTE — Plan of Care (Signed)
  Problem: Fluid Volume: Goal: Ability to maintain a balanced intake and output will improve Outcome: Progressing   Problem: Health Behavior/Discharge Planning: Goal: Ability to manage health-related needs will improve Outcome: Not Met (add Reason)   Problem: Nutritional: Goal: Maintenance of adequate nutrition will improve Outcome: Progressing   Problem: Skin Integrity: Goal: Risk for impaired skin integrity will decrease Outcome: Not Met (add Reason)

## 2023-09-11 NOTE — Progress Notes (Signed)
 PROGRESS NOTE    AZTLAN COLL  ONG:[id number] DOB: 01/13/1930 DOA: 09/06/2023 PCP: Cook, Jayce G, DO   Brief Narrative:    Mark Brady is a 89 y.o. male with medical history significant of T2DM, hypertension, hyperlipidemia and advanced dementia who presented with respiratory distress.  Recent hospitalization 05/12 to 08/29/23 after a mechanical fall, resulting in rib fractures. He was discharged to SNF with a foley catheter due to urinary retention.  Most information is from his wife at the bedside, patient is not communicative.  Apparently patient loss his consciousness after aspirating a milkshake.  Patient was admitted with right lower lobe aspiration pneumonia and started on IV antibiotics.  Unfortunately, his condition continues to decline in the setting of advanced dementia and he has poor oral intake.  PT has recommended SNF, but it does not appear that he would do well in the setting.  Palliative consulted 5/28 and patient is now comfort care only with plans to discharge to hospice home.  Assessment & Plan:   Principal Problem:   Aspiration pneumonia of right lower lobe (HCC) Active Problems:   Hypokalemia   Essential hypertension, benign   Type 2 diabetes mellitus with hyperlipidemia (HCC)   Parkinson's disease (HCC)   Rib fractures   BPH (benign prostatic hyperplasia)   Acute respiratory failure (HCC)  Assessment and Plan:   Aspiration pneumonia of right lower lobe (HCC) -Complicated with severe sepsis present on admission.  - Continue current IV antibiotics - Patient currently off oxygen supplementation with good saturation. - Follow culture results and follow-up recommendations by speech therapy - Long-term prognosis is poor/guarded. - Patient already DNR/DNI. - Appreciate assistance and recommendation by speech therapy. - Will attempt transition to Augmentin orally in order to complete treatment for aspiration pneumonia.   Hypokalemia/Hyponatremia.  -  Continue to maintain adequate hydration - Will continue telemetry monitoring for now. - Magnesium level within normal limits. - Will continue to further replete electrolytes and follow trend.   Essential hypertension, benign - Blood pressure overall stable - Continue to hold antihypertensive agents at the moment with concerns for high risk hypotension in the setting of sepsis.  Patient also with limited ability to take oral meds on today's exam. - Continue to follow vital signs.   Type 2 diabetes mellitus with hyperlipidemia (HCC) -Continue sliding scale insulin . - Continue to follow CBG fluctuation.   Parkinson's disease (HCC) -Advanced dementia. Acute metabolic encephalopathy due to pneumonia with acute respiratory failure.  - Continue Sinemet    Rib fractures -Overall with poor prognosis - Continue as needed analgesics.   BPH (benign prostatic hyperplasia)/urinary retention -Continue foley catheter in place, it has been repositioned in the ED.  -Follow culture result - Continue current antibiotics - Continue supportive care. - During recent hospitalization patient failed voiding trial.    Patient has now been transitioned to comfort measures only on 5/28 with plans to discharge to hospice facility hopefully by 5/29.  DVT prophylaxis: None Code Status: DNR/comfort care Family Communication: Wife and daughter at bedside 5/28 Disposition Plan: Hospice Home placement Status is: Inpatient Remains inpatient appropriate because: Need for IV medications.   Consultants:  Palliative care  Procedures:  None  Antimicrobials:  Anti-infectives (From admission, onward)    Start     Dose/Rate Route Frequency Ordered Stop   09/07/23 1000  cefTRIAXone (ROCEPHIN) 1 g in sodium chloride  0.9 % 100 mL IVPB  Status:  Discontinued        1 g 200 mL/hr over 30  Minutes Intravenous Every 24 hours 09/07/23 0030 09/11/23 1414   09/06/23 2245  azithromycin (ZITHROMAX) 500 mg in sodium  chloride 0.9 % 250 mL IVPB  Status:  Discontinued        500 mg 250 mL/hr over 60 Minutes Intravenous Every 24 hours 09/06/23 2234 09/07/23 0030   09/06/23 1945  cefTRIAXone (ROCEPHIN) 2 g in sodium chloride  0.9 % 100 mL IVPB        2 g 200 mL/hr over 30 Minutes Intravenous  Once 09/06/23 1940 09/06/23 2019      Subjective: Patient seen and evaluated today and did require a dose of Ativan  last night at around 10 PM.  No other acute events or concerns noted overnight.  Wife and daughter are at bedside and seem agreeable to hospice care.  He is not having very much oral intake.  Objective: Vitals:   09/10/23 0501 09/10/23 1338 09/10/23 1952 09/11/23 0445  BP: (!) 154/81 130/64 (!) 149/75 (!) 146/63  Pulse: 81 78 85 83  Resp:  14 17   Temp: 98.2 F (36.8 C) 99.9 F (37.7 C) 99.9 F (37.7 C) 99 F (37.2 C)  TempSrc: Axillary Oral Axillary Axillary  SpO2: 98% 94% 92% 98%  Weight:      Height:        Intake/Output Summary (Last 24 hours) at 09/11/2023 1536 Last data filed at 09/11/2023 1300 Gross per 24 hour  Intake 503.22 ml  Output 650 ml  Net -146.78 ml   Filed Weights   09/06/23 1837  Weight: 77.1 kg    Examination:  General exam: Appears calm and comfortable, frail-appearing, elderly Respiratory system: Clear to auscultation. Respiratory effort normal. Cardiovascular system: S1 & S2 heard, RRR.  Gastrointestinal system: Abdomen is soft Central nervous system: Alert and awake Extremities: No edema Skin: No significant lesions noted Psychiatry: Flat affect.    Data Reviewed: I have personally reviewed following labs and imaging studies  CBC: Recent Labs  Lab 09/06/23 1914 09/07/23 0700 09/08/23 0237  WBC 30.0* 21.4* 15.3*  NEUTROABS 26.9*  --   --   HGB 12.0* 10.6* 11.0*  HCT 35.7* 30.4* 33.1*  MCV 97.0 95.0 99.7  PLT 437* 426* 456*   Basic Metabolic Panel: Recent Labs  Lab 09/06/23 1914 09/07/23 0528 09/08/23 0237 09/09/23 0632 09/11/23 0423   NA 132* 135 137 136 143  K 2.6* 3.0* 2.8* 2.7* 3.0*  CL 100 108 111 101 112*  CO2 22 21* 20* 24 25  GLUCOSE 185* 125* 106* 123* 116*  BUN 27* 28* 23 22 24*  CREATININE 1.06 0.94 0.75 0.75 1.04  CALCIUM 8.0* 7.4* 7.6* 7.6* 8.1*  MG  --   --  2.0  --  2.0   GFR: Estimated Creatinine Clearance: 47.8 mL/min (by C-G formula based on SCr of 1.04 mg/dL). Liver Function Tests: Recent Labs  Lab 09/06/23 1914  AST 54*  ALT 8  ALKPHOS 72  BILITOT 1.4*  PROT 6.3*  ALBUMIN 2.9*   No results for input(s): "LIPASE", "AMYLASE" in the last 168 hours. No results for input(s): "AMMONIA" in the last 168 hours. Coagulation Profile: No results for input(s): "INR", "PROTIME" in the last 168 hours. Cardiac Enzymes: No results for input(s): "CKTOTAL", "CKMB", "CKMBINDEX", "TROPONINI" in the last 168 hours. BNP (last 3 results) No results for input(s): "PROBNP" in the last 8760 hours. HbA1C: No results for input(s): "HGBA1C" in the last 72 hours. CBG: Recent Labs  Lab 09/10/23 1113 09/10/23 1639 09/10/23 1957 09/11/23 1610  09/11/23 1123  GLUCAP 144* 110* 143* 112* 121*   Lipid Profile: No results for input(s): "CHOL", "HDL", "LDLCALC", "TRIG", "CHOLHDL", "LDLDIRECT" in the last 72 hours. Thyroid  Function Tests: No results for input(s): "TSH", "T4TOTAL", "FREET4", "T3FREE", "THYROIDAB" in the last 72 hours. Anemia Panel: No results for input(s): "VITAMINB12", "FOLATE", "FERRITIN", "TIBC", "IRON", "RETICCTPCT" in the last 72 hours. Sepsis Labs: Recent Labs  Lab 09/06/23 2214 09/07/23 0017  LATICACIDVEN 2.4* 1.3    Recent Results (from the past 240 hours)  Blood culture (routine x 2)     Status: None   Collection Time: 09/06/23 10:14 PM   Specimen: Left Antecubital; Blood  Result Value Ref Range Status   Specimen Description LEFT ANTECUBITAL  Final   Special Requests AEROBIC BOTTLE ONLY Blood Culture adequate volume  Final   Culture   Final    NO GROWTH 5 DAYS Performed at  [facility], [address], [place], [place] [address]    Report Status 09/11/2023 FINAL  Final  Blood culture (routine x 2)     Status: None   Collection Time: 09/06/23 10:15 PM   Specimen: Right Antecubital; Blood  Result Value Ref Range Status   Specimen Description RIGHT ANTECUBITAL  Final   Special Requests AEROBIC BOTTLE ONLY Blood Culture adequate volume  Final   Culture   Final    NO GROWTH 5 DAYS Performed at [facility], [address], [place], [place] [address]    Report Status 09/11/2023 FINAL  Final         Radiology Studies: No results found.      Scheduled Meds:  sodium chloride  flush  3 mL Intravenous Q12H     LOS: 5 days    Time spent: 35 minutes    Johnnetta Holstine Loran Rock, DO [facility]  If 7PM-7AM, please contact night-coverage [email or web address] 09/11/2023, 3:36 PM

## 2023-09-11 NOTE — Progress Notes (Signed)
 Physical Therapy Treatment Patient Details Name: Mark Brady MRN: [id number] DOB: 10-19-1929 Today's Date: 09/11/2023   History of Present Illness Mark Brady is a 89 y.o. male with medical history significant of T2DM, hypertension, hyperlipidemia and advanced dementia who presented with respiratory distress.   Recent hospitalization 05/12 to 08/29/23 after a mechanical fall, resulting in rib fractures. He was discharged to SNF with a foley catheter due to urinary retention.   Most information is from his wife at the bedside, patient is not communicative.   Apparently patient loss his consciousness after aspirating a milkshake. EMS was called and he got about 10 ml of white milkshake cleared from his upper airway. Patient recovered his consciousness and was transported to the ED. His 02 saturation was 62% and required 15 L per non re breather mask to increase to 92%.      At the time of my examination patient is not verbal, he has mittens in place and appears in pain.   His foley was repositioned after patient pulling the tubing.      Per his wife patient's dementia has been progressively worsening since his initial hospitalization on 08/21/23 and posterior transfer to SNF.   He has been having hallucinations and has been fully dependent for his daily activities of daily living.    PT Comments  Patient continues to demonstrate decreased lethargic behavior during therapy session with LE strength, decreased gait quality and balance noted. Patient also demonstrates need for max assist for bed mobility, functional transfers, and ambulation with gait belt and RW. Patient able to demonstrate inconsistent following of verbal one step commands. Co treat with OT appreciated as max assist required with bed to chair transfer. Patient would continue to benefit from skilled physical therapy for increased endurance with ambulation, increased LE strength, and improved balance for improved quality of life,  improved independence with gait training and continued progress towards therapy goals.     If plan is discharge home, recommend the following: A lot of help with bathing/dressing/bathroom;A lot of help with walking and/or transfers;Help with stairs or ramp for entrance;Assistance with cooking/housework   Can travel by private vehicle     No  Equipment Recommendations  None recommended by PT    Recommendations for Other Services       Precautions / Restrictions Precautions Precautions: Fall Recall of Precautions/Restrictions: Impaired Restrictions Weight Bearing Restrictions Per Provider Order: No     Mobility  Bed Mobility Overal bed mobility: Needs Assistance Bed Mobility: Supine to Sit   Sidelying to sit: Max assist Supine to sit: Max assist Sit to supine: Max assist   General bed mobility comments: Pt lethargic and needing max A to sit upright in bed.    Transfers Overall transfer level: Needs assistance Equipment used: Rolling walker (2 wheels), None Transfers: Sit to/from Stand, Bed to chair/wheelchair/BSC Sit to Stand: Total assist, Max assist, +2 physical assistance   Step pivot transfers: Max assist, +2 physical assistance, Total assist       General transfer comment: Pt able to achieving standing on the first attempt with RW and +2 assist with a therapist on either side. Pt was then able to step to the chair with more max A using the RW.    Ambulation/Gait Ambulation/Gait assistance: Mod assist, Max assist Gait Distance (Feet): 5 Feet Assistive device: Rolling walker (2 wheels) Gait Pattern/deviations: Decreased step length - right, Decreased step length - left, Decreased stride length, Shuffle, Leaning posteriorly Gait velocity: decreased  General Gait Details: limited to a few slow labored side steps, steps forward/backward with therapist on either side and multiple VC for UE placment on RW.   Stairs             Wheelchair Mobility      Tilt Bed    Modified Rankin (Stroke Patients Only)       Balance Overall balance assessment: Needs assistance Sitting-balance support: Feet supported, Bilateral upper extremity supported Sitting balance-Leahy Scale: Fair Sitting balance - Comments: seated at EOB Postural control: Posterior lean Standing balance support: During functional activity, Bilateral upper extremity supported, Reliant on assistive device for balance Standing balance-Leahy Scale: Poor Standing balance comment: using RW                            Communication Communication Communication: Impaired Factors Affecting Communication: Reduced clarity of speech;Difficulty expressing self  Cognition Arousal: Lethargic Behavior During Therapy: Anxious   PT - Cognitive impairments: Difficult to assess Difficult to assess due to: Level of arousal                     PT - Cognition Comments: Repeated verbal and tactile cueing given throughout. Sternal rubbing unsuccessful. Family (wife and son present), pt not aroused to their voice or touch. Following commands: Impaired Following commands impaired: Follows one step commands inconsistently    Cueing Cueing Techniques: Verbal cues, Tactile cues  Exercises General Exercises - Lower Extremity Ankle Circles/Pumps: AAROM, 10 reps Long Arc Quad: AAROM, 10 reps Hip ABduction/ADduction: AAROM, 10 reps Hip Flexion/Marching: AAROM, 10 reps Toe Raises: AAROM, 10 reps Heel Raises: AAROM, 10 reps    General Comments        Pertinent Vitals/Pain Pain Assessment Pain Assessment: Faces Faces Pain Scale: Hurts even more    Home Living                          Prior Function            PT Goals (current goals can now be found in the care plan section) Acute Rehab PT Goals Patient Stated Goal: return home after rehab PT Goal Formulation: With patient/family Time For Goal Achievement: 09/22/23 Potential to Achieve Goals:  Good Progress towards PT goals: Progressing toward goals    Frequency    Min 3X/week      PT Plan      Co-evaluation PT/OT/SLP Co-Evaluation/Treatment: Yes Reason for Co-Treatment: To address functional/ADL transfers;Complexity of the patient's impairments (multi-system involvement) PT goals addressed during session: Mobility/safety with mobility;Balance OT goals addressed during session: ADL's and self-care      AM-PAC PT "6 Clicks" Mobility   Outcome Measure  Help needed turning from your back to your side while in a flat bed without using bedrails?: A Lot Help needed moving from lying on your back to sitting on the side of a flat bed without using bedrails?: A Lot Help needed moving to and from a bed to a chair (including a wheelchair)?: A Lot   Help needed to walk in hospital room?: A Lot Help needed climbing 3-5 steps with a railing? : Total 6 Click Score: 9    End of Session Equipment Utilized During Treatment: Gait belt Activity Tolerance: Patient limited by fatigue;Patient limited by lethargy Patient left: in bed;with call bell/phone within reach;with bed alarm set;with family/visitor present Nurse Communication: Mobility status PT Visit Diagnosis: Unsteadiness on feet (R26.81);Other  abnormalities of gait and mobility (R26.89);Muscle weakness (generalized) (M62.81)     Time: 1610-9604 PT Time Calculation (min) (ACUTE ONLY): 17 min  Charges:    $Therapeutic Exercise: 8-22 mins PT General Charges $$ ACUTE PT VISIT: 1 Visit                     Armond Bertin, PT, DPT [facility] Office: [phone] 10:51 AM, 09/11/23

## 2023-09-11 NOTE — Plan of Care (Signed)

## 2023-09-11 NOTE — TOC Progression Note (Addendum)
 Transition of Care [facility]) - Progression Note    Patient Details  Name: Mark Brady MRN: [id number] Date of Birth: [birth year]  Transition of Care [facility]) CM/SW Contact  Ander Katos, [place] Phone Number: 09/11/2023, 2:13 PM  Clinical Narrative: Referral sent to [facility] for home with hospice vs [facility]. Requested hospice follow up with family to discuss.  Left voicemail for wife.   Update: Hospice has spoken with patient's wife and hospice RN will assess between 9:30-10:00 tomorrow morning to determine if [facility] appropriate.   Expected Discharge Plan: Skilled Nursing Facility Barriers to Discharge: Continued Medical Work up  Expected Discharge Plan and Services     Post Acute Care Choice: Skilled Nursing Facility Living arrangements for the past 2 months: Skilled Nursing Facility                                       Social Determinants of Health (SDOH) Interventions SDOH Screenings   Food Insecurity: No Food Insecurity (09/07/2023)  Housing: Low Risk  (09/07/2023)  Transportation Needs: No Transportation Needs (09/07/2023)  Utilities: Not At Risk (09/07/2023)  Alcohol Screen: Low Risk  (05/17/2023)  Depression (PHQ2-9): Low Risk  (05/29/2023)  Financial Resource Strain: Low Risk  (05/17/2023)  Physical Activity: Insufficiently Active (05/17/2023)  Social Connections: Unknown (09/07/2023)  Stress: No Stress Concern Present (05/17/2023)  Tobacco Use: Low Risk  (09/11/2023)  Health Literacy: Patient Declined (05/17/2023)    Readmission Risk Interventions    08/29/2023   10:10 AM  Readmission Risk Prevention Plan  Transportation Screening Complete  PCP or Specialist Appt within 3-5 Days Complete  HRI or Home Care Consult Complete  Social Work Consult for Recovery Care Planning/Counseling Complete  Palliative Care Screening Not Applicable  Medication Review [employer]) Complete

## 2023-09-12 DIAGNOSIS — J690 Pneumonitis due to inhalation of food and vomit: Secondary | ICD-10-CM | POA: Diagnosis not present

## 2023-09-12 MED ORDER — MORPHINE SULFATE (CONCENTRATE) 20 MG/ML PO SOLN: 10.0000 mg | ORAL | 0 refills | Status: DC | PRN

## 2023-09-12 MED ORDER — LORAZEPAM 2 MG/ML PO CONC: 1.0000 mg | ORAL | 0 refills | Status: DC | PRN

## 2023-09-12 MED ORDER — MORPHINE SULFATE 20 MG/5ML PO SOLN: 2.5000 mg | ORAL | 0 refills | Status: DC | PRN

## 2023-09-12 NOTE — Progress Notes (Addendum)
 Vitals stable. Responds to turning with moaning. No signs of mottling.  Has received prn ativan  and robinul  today. EMS has been called since hospital bed has been delivered by [facility] to home.  Family to pick up meds from them. Waiting for transport.  Family present off and on today.

## 2023-09-12 NOTE — Progress Notes (Signed)
 Palliative: Face-to-face discussion with [facility] hospice representative.  Mr. Coba will be discharging to his own home with the benefits of [facility] hospice care.  No needs at this time.  Plan: Home with [facility] hospice services.  Anticipate transition to residential hospice at [place] [facility] if needed, do not rehospitalize.  End-of-life order set implemented DNR/goldenrod form completed and placed on chart.  No charge Arla Lab, NP Palliative medicine team Team phone [phone]

## 2023-09-12 NOTE — Discharge Summary (Signed)
 Physician Discharge Summary  Mark Brady:[id number] DOB: 06-27-1929 DOA: 09/06/2023  PCP: Cook, Jayce G, DO  Admit date: 09/06/2023  Discharge date: 09/12/2023  Admitted From:Home  Disposition:  Home with hospice  Recommendations for Outpatient Follow-up:  Follow up with hospice agency Continue on medications as noted below  Home Health: None  Equipment/Devices: Hospital bed  Discharge Condition:Stable  CODE STATUS: DNR  Diet recommendation: Pleasure feeds  Brief/Interim Summary: Mark Brady is a 89 y.o. male with medical history significant of T2DM, hypertension, hyperlipidemia and advanced dementia who presented with respiratory distress.  Recent hospitalization 05/12 to 08/29/23 after a mechanical fall, resulting in rib fractures. He was discharged to SNF with a foley catheter due to urinary retention.  Most information is from his wife at the bedside, patient is not communicative.  Apparently patient loss his consciousness after aspirating a milkshake.  Patient was admitted with right lower lobe aspiration pneumonia and started on IV antibiotics.  Unfortunately, his condition continues to decline in the setting of advanced dementia and he has poor oral intake.  PT has recommended SNF, but it does not appear that he would do well in the setting.  Palliative consulted 5/28 and patient is now comfort care only with plans to discharge to hospice home.  Discharge Diagnoses:  Principal Problem:   Aspiration pneumonia of right lower lobe (HCC) Active Problems:   Hypokalemia   Essential hypertension, benign   Type 2 diabetes mellitus with hyperlipidemia (HCC)   Parkinson's disease (HCC)   Rib fractures   BPH (benign prostatic hyperplasia)   Acute respiratory failure (HCC)  Principal discharge diagnosis: Right lower lobe aspiration pneumonia now transition to comfort care in the setting of advanced dementia/Parkinson's disease.  Discharge Instructions  Discharge  Instructions     Diet - low sodium heart healthy   Complete by: As directed    Increase activity slowly   Complete by: As directed       Allergies as of 09/12/2023       Reactions   Penicillins Other (See Comments)   Unknown, no reaction listed on MAR   Aspirin  Rash   No reaction listed on MAR for verification        Medication List     STOP taking these medications    Accu-Chek Aviva Plus test strip Generic drug: glucose blood   Accu-Chek Softclix Lancets lancets   acetaminophen  500 MG tablet Commonly known as: TYLENOL    amLODipine  10 MG tablet Commonly known as: NORVASC    cyanocobalamin  2000 MCG tablet   losartan  100 MG tablet Commonly known as: COZAAR    omeprazole  40 MG capsule Commonly known as: PRILOSEC   pravastatin  40 MG tablet Commonly known as: PRAVACHOL        TAKE these medications    carbidopa -levodopa  25-100 MG tablet Commonly known as: SINEMET  IR Take 2 tablets by mouth 3 (three) times daily. 7am/11am/4pm What changed: additional instructions   dorzolamide -timolol  2-0.5 % ophthalmic solution Commonly known as: COSOPT  Place 1 drop into both eyes daily.   latanoprost  0.005 % ophthalmic solution Commonly known as: XALATAN  Place 1 drop into both eyes at bedtime.   lidocaine  5 % Commonly known as: LIDODERM  Place 1 patch onto the skin daily. Remove & Discard patch within 12 hours; apply to affected area (right sided costal zone)   LORazepam  2 MG/ML concentrated solution Commonly known as: ATIVAN  Take 0.5 mLs (1 mg total) by mouth every 4 (four) hours as needed for anxiety.   morphine  20  MG/ML concentrated solution Commonly known as: ROXANOL Take 0.5 mLs (10 mg total) by mouth every 2 (two) hours as needed for severe pain (pain score 7-10), breakthrough pain, anxiety or shortness of breath.   polyethylene glycol 17 g packet Commonly known as: MIRALAX  / GLYCOLAX  Take 17 g by mouth daily.   senna-docusate 8.6-50 MG tablet Commonly  known as: Senokot-S Take 2 tablets by mouth at bedtime.   tamsulosin  0.4 MG Caps capsule Commonly known as: FLOMAX  Take 1 capsule (0.4 mg total) by mouth daily after supper.               Durable Medical Equipment  (From admission, onward)           Start     Ordered   09/12/23 1307  For home use only DME Hospital bed  Once       Question Answer Comment  Length of Need 6 Months   The above medical condition requires: Patient requires the ability to reposition frequently   Head must be elevated greater than: 30 degrees   Bed type Semi-electric      09/12/23 1307            Follow-up Information     Cook, Jayce G, DO. Schedule an appointment as soon as possible for a visit in 1 week(s).   Specialty: Family Medicine Contact information: [address] [place] [address] [phone]                Allergies  Allergen Reactions   Penicillins Other (See Comments)    Unknown, no reaction listed on MAR   Aspirin  Rash    No reaction listed on [facility] for verification    Consultations: Palliative care   Procedures/Studies: CT Angio Chest PE W and/or Wo Contrast Result Date: 09/06/2023 CLINICAL DATA:  Found unconscious. Aspiration. Pallor. Distended abdomen. EMS cleared airway and found 10 mL of white milkshake in patient's airway. EXAM: CT ANGIOGRAPHY CHEST CT ABDOMEN AND PELVIS WITH CONTRAST TECHNIQUE: Multidetector CT imaging of the chest was performed using the standard protocol during bolus administration of intravenous contrast. Multiplanar CT image reconstructions and MIPs were obtained to evaluate the vascular anatomy. Multidetector CT imaging of the abdomen and pelvis was performed using the standard protocol during bolus administration of intravenous contrast. RADIATION DOSE REDUCTION: This exam was performed according to the departmental dose-optimization program which includes automated exposure control, adjustment of the mA and/or kV  according to patient size and/or use of iterative reconstruction technique. CONTRAST:  OMNIPAQUE  IOHEXOL  350 MG/ML SOLN COMPARISON:  Same day chest radiograph; CT chest abdomen pelvis 08/26/2023 FINDINGS: CTA CHEST FINDINGS Cardiovascular: No pericardial effusion. Coronary artery and aortic atherosclerotic calcification. Motion obscures the segmental and subsegmental pulmonary arteries. No central pulmonary embolism. Mediastinum/Nodes: Layering debris in the posterior trachea. Esophagus is unremarkable. No thoracic adenopathy. Lungs/Pleura: Respiratory motion obscures fine detail particularly in the lower lungs. Diffuse bronchial wall thickening. Patchy Peribronchovascular consolidation in the lower lungs greatest in the right lower lobe. Trace right pleural effusion. No pneumothorax. Musculoskeletal: Slightly increased displacement of the fractures of the posterior right 9th-11th ribs compared to 08/26/2023. No evidence of acute fracture. Review of the MIP images confirms the above findings. CT ABDOMEN and PELVIS FINDINGS Hepatobiliary: No acute abnormality. Pancreas: No acute abnormality. Spleen: Unremarkable. Adrenals/Urinary Tract: Stable adrenal glands. No urinary calculi or hydronephrosis. Gas within the bladder. The Foley catheter is malpositioned with balloon in the prostatic urethra. Stomach/Bowel: Stomach and appendix are within  normal limits. No bowel wall thickening. Moderate stool burden in the transverse colon. No evidence of obstruction. Vascular/Lymphatic: Aortic atherosclerosis. No enlarged abdominal or pelvic lymph nodes. Reproductive: Marked enlargement of the prostate. Foley catheter balloon within the prostatic urethra. Other: No free intraperitoneal fluid or air. Musculoskeletal: No acute fracture. Review of the MIP images confirms the above findings. IMPRESSION: CHEST: 1. No central pulmonary embolism. Motion obscures the segmental and subsegmental pulmonary arteries. 2. Patchy  peribronchovascular consolidation in the lower lungs greatest in the right lower lobe, likely aspiration pneumonia. 3. Trace right pleural effusion. ABDOMEN/PELVIS: 1. Foley catheter balloon is malpositioned with balloon in the prostatic urethra. Recommend repositioning. 2. Prostatomegaly. 3. Aortic Atherosclerosis (ICD10-I70.0). Electronically Signed   By: Rozell Cornet M.D.   On: 09/06/2023 22:05   CT ABDOMEN PELVIS W CONTRAST Result Date: 09/06/2023 CLINICAL DATA:  Found unconscious. Aspiration. Pallor. Distended abdomen. EMS cleared airway and found 10 mL of white milkshake in patient's airway. EXAM: CT ANGIOGRAPHY CHEST CT ABDOMEN AND PELVIS WITH CONTRAST TECHNIQUE: Multidetector CT imaging of the chest was performed using the standard protocol during bolus administration of intravenous contrast. Multiplanar CT image reconstructions and MIPs were obtained to evaluate the vascular anatomy. Multidetector CT imaging of the abdomen and pelvis was performed using the standard protocol during bolus administration of intravenous contrast. RADIATION DOSE REDUCTION: This exam was performed according to the departmental dose-optimization program which includes automated exposure control, adjustment of the mA and/or kV according to patient size and/or use of iterative reconstruction technique. CONTRAST:  OMNIPAQUE  IOHEXOL  350 MG/ML SOLN COMPARISON:  Same day chest radiograph; CT chest abdomen pelvis 08/26/2023 FINDINGS: CTA CHEST FINDINGS Cardiovascular: No pericardial effusion. Coronary artery and aortic atherosclerotic calcification. Motion obscures the segmental and subsegmental pulmonary arteries. No central pulmonary embolism. Mediastinum/Nodes: Layering debris in the posterior trachea. Esophagus is unremarkable. No thoracic adenopathy. Lungs/Pleura: Respiratory motion obscures fine detail particularly in the lower lungs. Diffuse bronchial wall thickening. Patchy Peribronchovascular consolidation in the  lower lungs greatest in the right lower lobe. Trace right pleural effusion. No pneumothorax. Musculoskeletal: Slightly increased displacement of the fractures of the posterior right 9th-11th ribs compared to 08/26/2023. No evidence of acute fracture. Review of the MIP images confirms the above findings. CT ABDOMEN and PELVIS FINDINGS Hepatobiliary: No acute abnormality. Pancreas: No acute abnormality. Spleen: Unremarkable. Adrenals/Urinary Tract: Stable adrenal glands. No urinary calculi or hydronephrosis. Gas within the bladder. The Foley catheter is malpositioned with balloon in the prostatic urethra. Stomach/Bowel: Stomach and appendix are within normal limits. No bowel wall thickening. Moderate stool burden in the transverse colon. No evidence of obstruction. Vascular/Lymphatic: Aortic atherosclerosis. No enlarged abdominal or pelvic lymph nodes. Reproductive: Marked enlargement of the prostate. Foley catheter balloon within the prostatic urethra. Other: No free intraperitoneal fluid or air. Musculoskeletal: No acute fracture. Review of the MIP images confirms the above findings. IMPRESSION: CHEST: 1. No central pulmonary embolism. Motion obscures the segmental and subsegmental pulmonary arteries. 2. Patchy peribronchovascular consolidation in the lower lungs greatest in the right lower lobe, likely aspiration pneumonia. 3. Trace right pleural effusion. ABDOMEN/PELVIS: 1. Foley catheter balloon is malpositioned with balloon in the prostatic urethra. Recommend repositioning. 2. Prostatomegaly. 3. Aortic Atherosclerosis (ICD10-I70.0). Electronically Signed   By: Rozell Cornet M.D.   On: 09/06/2023 22:05   DG Chest Portable 1 View Result Date: 09/06/2023 CLINICAL DATA:  Dyspnea EXAM: PORTABLE CHEST 1 VIEW COMPARISON:  05/31/2021, 08/26/2023 FINDINGS: Single frontal view of the chest demonstrates an unremarkable cardiac silhouette. Lung volumes are  diminished, with chronic background scarring and bibasilar  hypoventilatory changes noted. No airspace disease, effusion, or pneumothorax. No acute bony abnormalities. The right rib fracture seen on recent chest CT are not well visualized by x-ray. IMPRESSION: 1. Hypoventilatory changes.  No acute intrathoracic process. Electronically Signed   By: Bobbye Burrow M.D.   On: 09/06/2023 20:10   DG Shoulder Left Port Result Date: 08/27/2023 CLINICAL DATA:  Recent fall with left shoulder pain, initial encounter EXAM: LEFT SHOULDER COMPARISON:  None Available. FINDINGS: Degenerative changes of the acromioclavicular joint are seen. No acute fracture or dislocation is noted. No soft tissue changes are seen. IMPRESSION: Degenerative change without acute abnormality. Electronically Signed   By: Violeta Grey M.D.   On: 08/27/2023 00:05   DG Shoulder Right Port Result Date: 08/27/2023 CLINICAL DATA:  Recent fall with right shoulder pain, initial encounter EXAM: RIGHT SHOULDER - 1 VIEW COMPARISON:  None Available. FINDINGS: Degenerative changes of the acromioclavicular joint are seen. Remodeling of the acromion is noted with a high-riding humeral head consistent with chronic rotator cuff injury. Underlying bony thorax appears within normal limits. No other focal abnormality is noted. IMPRESSION: Chronic changes without acute abnormality. Electronically Signed   By: Violeta Grey M.D.   On: 08/27/2023 00:05   CT CHEST ABDOMEN PELVIS WO CONTRAST Result Date: 08/27/2023 CLINICAL DATA:  Recent fall EXAM: CT CHEST, ABDOMEN AND PELVIS WITHOUT CONTRAST TECHNIQUE: Multidetector CT imaging of the chest, abdomen and pelvis was performed following the standard protocol without IV contrast. RADIATION DOSE REDUCTION: This exam was performed according to the departmental dose-optimization program which includes automated exposure control, adjustment of the mA and/or kV according to patient size and/or use of iterative reconstruction technique. COMPARISON:  08/21/2023 FINDINGS: CT CHEST  FINDINGS Cardiovascular: Limited due to lack of IV contrast. Atherosclerotic calcifications are noted. No aneurysmal dilatation is seen. Coronary calcifications are noted. Heart is not significantly enlarged in size. Mediastinum/Nodes: Thoracic inlet is within normal limits. No hilar or mediastinal adenopathy is noted. The esophagus as visualized is within normal limits. Lungs/Pleura: Basilar atelectasis is noted increased on the right when compared with the prior exam with a small right-sided pleural effusion. No sizable parenchymal nodule is seen. Stable less than 5 mm nodule is noted in the left upper lobe best seen on image number 47 of series 4. no follow-up is recommended. Musculoskeletal: Fractures of the right posterior eighth through eleventh ribs are again seen without pneumothorax. The atelectasis and new effusion is likely reactive in nature. CT ABDOMEN PELVIS FINDINGS Hepatobiliary: No focal liver abnormality is seen. No gallstones, gallbladder wall thickening, or biliary dilatation. Pancreas: Unremarkable. No pancreatic ductal dilatation or surrounding inflammatory changes. Spleen: Normal in size without focal abnormality. Adrenals/Urinary Tract: Adrenal glands are within normal limits. Kidneys are well visualized bilaterally with hydronephrosis and hydroureter new from the prior exam. This is felt to be related to over distention of the bladder as no calculi are seen. No renal calculi are noted. The bladder is well distended. Stomach/Bowel: No obstructive or inflammatory changes of colon are seen. Fecal material is noted throughout the colon. The appendix is not well visualized although no inflammatory changes to suggest appendicitis are noted. Small bowel and stomach are unremarkable. Vascular/Lymphatic: Aortic atherosclerosis. No enlarged abdominal or pelvic lymph nodes. Reproductive: Prostate is enlarged. Other: No abdominal wall hernia or abnormality. No abdominopelvic ascites. Musculoskeletal:  There are degenerative changes of lumbar spine are noted. IMPRESSION: CT of the chest: Stable appearing right-sided ribs with new reactive  atelectasis and small effusion. CT of the abdomen and pelvis: Bilateral hydronephrosis and hydroureter secondary to an over distended bladder. No stones are seen. Electronically Signed   By: Violeta Grey M.D.   On: 08/27/2023 00:03   CT HEAD WO CONTRAST Result Date: 08/26/2023 CLINICAL DATA:  Recent fall with headaches and neck pain, initial encounter EXAM: CT HEAD WITHOUT CONTRAST CT CERVICAL SPINE WITHOUT CONTRAST TECHNIQUE: Multidetector CT imaging of the head and cervical spine was performed following the standard protocol without intravenous contrast. Multiplanar CT image reconstructions of the cervical spine were also generated. RADIATION DOSE REDUCTION: This exam was performed according to the departmental dose-optimization program which includes automated exposure control, adjustment of the mA and/or kV according to patient size and/or use of iterative reconstruction technique. COMPARISON:  08/21/2023 FINDINGS: CT HEAD FINDINGS Brain: No evidence of acute infarction, hemorrhage, hydrocephalus, extra-axial collection or mass lesion/mass effect. Chronic atrophic and ischemic changes are again seen and stable. Vascular: No hyperdense vessel or unexpected calcification. Skull: Normal. Negative for fracture or focal lesion. Sinuses/Orbits: Orbits and their contents are within normal limits. Some sclerotic changes are noted about the sphenoid sinus on the left with opacification similar to that seen on wall. Other: None CT CERVICAL SPINE FINDINGS Alignment: Loss of the normal cervical lordosis. Skull base and vertebrae: 7 cervical segments are well visualized. Vertebral body height is well maintained. Minimal anterolisthesis of C4 on C5 of a degenerative nature is noted. No acute fracture or acute facet abnormality is noted. Degenerative facet hypertrophic changes are seen.  Soft tissues and spinal canal: Surrounding soft tissue structures are within normal limits. Upper chest: Visualized lung apices are unremarkable. Other: None IMPRESSION: CT of the head: Chronic changes as described stable from the recent exam. CT of the cervical spine: Multilevel degenerative change without acute abnormality. Electronically Signed   By: Violeta Grey M.D.   On: 08/26/2023 23:50   CT CERVICAL SPINE WO CONTRAST Result Date: 08/26/2023 CLINICAL DATA:  Recent fall with headaches and neck pain, initial encounter EXAM: CT HEAD WITHOUT CONTRAST CT CERVICAL SPINE WITHOUT CONTRAST TECHNIQUE: Multidetector CT imaging of the head and cervical spine was performed following the standard protocol without intravenous contrast. Multiplanar CT image reconstructions of the cervical spine were also generated. RADIATION DOSE REDUCTION: This exam was performed according to the departmental dose-optimization program which includes automated exposure control, adjustment of the mA and/or kV according to patient size and/or use of iterative reconstruction technique. COMPARISON:  08/21/2023 FINDINGS: CT HEAD FINDINGS Brain: No evidence of acute infarction, hemorrhage, hydrocephalus, extra-axial collection or mass lesion/mass effect. Chronic atrophic and ischemic changes are again seen and stable. Vascular: No hyperdense vessel or unexpected calcification. Skull: Normal. Negative for fracture or focal lesion. Sinuses/Orbits: Orbits and their contents are within normal limits. Some sclerotic changes are noted about the sphenoid sinus on the left with opacification similar to that seen on wall. Other: None CT CERVICAL SPINE FINDINGS Alignment: Loss of the normal cervical lordosis. Skull base and vertebrae: 7 cervical segments are well visualized. Vertebral body height is well maintained. Minimal anterolisthesis of C4 on C5 of a degenerative nature is noted. No acute fracture or acute facet abnormality is noted. Degenerative  facet hypertrophic changes are seen. Soft tissues and spinal canal: Surrounding soft tissue structures are within normal limits. Upper chest: Visualized lung apices are unremarkable. Other: None IMPRESSION: CT of the head: Chronic changes as described stable from the recent exam. CT of the cervical spine: Multilevel degenerative change without acute  abnormality. Electronically Signed   By: Violeta Grey M.D.   On: 08/26/2023 23:50   CT CHEST ABDOMEN PELVIS W CONTRAST Result Date: 08/21/2023 CLINICAL DATA:  Polytrauma, blunt R flank pain sp fall. Redness and bruising to right flank area. EXAM: CT CHEST, ABDOMEN, AND PELVIS WITH CONTRAST TECHNIQUE: Multidetector CT imaging of the chest, abdomen and pelvis was performed following the standard protocol during bolus administration of intravenous contrast. RADIATION DOSE REDUCTION: This exam was performed according to the departmental dose-optimization program which includes automated exposure control, adjustment of the mA and/or kV according to patient size and/or use of iterative reconstruction technique. CONTRAST:  OMNIPAQUE  IOHEXOL  300 MG/ML  SOLN COMPARISON:  CT scan abdomen from 06/27/2007 and CT scan chest, abdomen and pelvis from 03/31/2005. FINDINGS: CT CHEST FINDINGS Cardiovascular: Normal cardiac size. No pericardial effusion. No aortic aneurysm. There are coronary artery calcifications, in keeping with coronary artery disease. There are also mild-to-moderate peripheral atherosclerotic vascular calcifications of thoracic aorta and its major branches. Mediastinum/Nodes: Visualized thyroid  gland appears grossly unremarkable. No solid / cystic mediastinal masses. The esophagus is nondistended precluding optimal assessment. No axillary, mediastinal or hilar lymphadenopathy by size criteria. Lungs/Pleura: The central tracheo-bronchial tree is patent. There are dependent changes in bilateral lungs. No mass or consolidation. No pleural effusion or pneumothorax.  There is a sub 4 mm solid noncalcified nodule in the left lung upper lobe (series 5, image 43), which is present since the prior study from 2006 and favored benign. No new or suspicious lung nodule. Musculoskeletal: The visualized soft tissues of the chest wall are grossly unremarkable. No suspicious osseous lesions. There are mild to moderate multilevel degenerative changes in the visualized spine. There are mildly displaced fractures of posterolateral right ninth through eleventh ribs. CT ABDOMEN PELVIS FINDINGS Hepatobiliary: The liver is normal in size. Non-cirrhotic configuration. These is diffuse hepatic steatosis. No suspicious mass. Note is made of at least 2, subcentimeter sized hypoattenuating foci, which are too small to adequately characterize. No intrahepatic or extrahepatic bile duct dilation. No calcified gallstones. Normal gallbladder wall thickness. No pericholecystic inflammatory changes. Pancreas: Small/atrophic pancreas. No peripancreatic fat stranding. There are several hypoattenuating structures in the pancreatic body (largest measuring up to 8 x 11 mm) and uncinate process measuring up to 10 x 15 mm, which are incompletely characterized on the current exam. The 2 adjacent lesions in the pancreatic body were not distinctly visualized on the prior study from 2009. However, the lesion in the uncinate process is present since the prior study from 2009. 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 Correlate clinically to determine the need for follow-up examination with MRI/MRCP in 2 years. Spleen: Within normal limits. No focal lesion. Adrenals/Urinary Tract: Adrenal glands are unremarkable. No suspicious renal mass. No hydronephrosis. No renal or ureteric calculi. Unremarkable urinary bladder. Stomach/Bowel: There is a tiny sliding hiatal hernia. No disproportionate dilation of the small or large bowel loops. No evidence of abnormal bowel  wall thickening or inflammatory changes. The appendix is unremarkable. There are scattered diverticula mainly in the sigmoid colon, without imaging signs of diverticulitis. Vascular/Lymphatic: No ascites or pneumoperitoneum. No abdominal or pelvic lymphadenopathy, by size criteria. No aneurysmal dilation of the major abdominal arteries. There are moderate peripheral atherosclerotic vascular calcifications of the aorta and its major branches. Reproductive: Enlarged prostate. Correlation with serum PSA levels is recommended. Symmetric seminal vesicles. Other: The visualized soft tissues and abdominal wall are unremarkable. Musculoskeletal: No suspicious osseous lesions. There are mild -  moderate multilevel degenerative changes in the visualized spine. IMPRESSION: 1. Mildly displaced fractures of posterolateral right ninth through eleventh ribs. Otherwise, no traumatic injury to the chest, abdomen or pelvis. 2. Severe pancreatic lesions and markedly enlarged prostate gland, as discussed above. 3. Multiple other nonacute observations, as described above. Aortic Atherosclerosis (ICD10-I70.0). Electronically Signed   By: Beula Brunswick M.D.   On: 08/21/2023 09:39   CT HEAD WO CONTRAST Result Date: 08/21/2023 CLINICAL DATA:  Provided history: Head trauma, moderate/severe. Polytrauma, blunt. Additional history provided: Fall. EXAM: CT HEAD WITHOUT CONTRAST CT CERVICAL SPINE WITHOUT CONTRAST TECHNIQUE: Multidetector CT imaging of the head and cervical spine was performed following the standard protocol without intravenous contrast. Multiplanar CT image reconstructions of the cervical spine were also generated. RADIATION DOSE REDUCTION: This exam was performed according to the departmental dose-optimization program which includes automated exposure control, adjustment of the mA and/or kV according to patient size and/or use of iterative reconstruction technique. COMPARISON:  Head CT 12/05/2022.  Cervical spine CT  12/05/2022. FINDINGS: CT HEAD FINDINGS Brain: Generalized cerebral atrophy. 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 Background moderate-to-advanced patchy and ill-defined hypoattenuation within the cerebral white matter, nonspecific but compatible with chronic small vessel ischemic disease. There is no acute intracranial hemorrhage. No demarcated cortical infarct. No extra-axial fluid collection. No evidence of an intracranial mass. No midline shift. Vascular: No hyperdense vessel.  Atherosclerotic calcifications. Skull: No calvarial fracture or aggressive osseous lesion. Sinuses/Orbits: No mass or acute finding within the imaged orbits. 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 CT CERVICAL SPINE FINDINGS Alignment: Nonspecific reversal of the expected cervical or doses. 2 mm C3-C4 grade 1 anterolisthesis. 3 mm C4-C5 grade 1 anterolisthesis. 2 mm C5-C6 grade 1 anterolisthesis. Skull base and vertebrae: The basion-dental and atlanto-dental intervals are maintained.No evidence of acute fracture to the cervical spine. Facet ankylosis on the right at C3-C4. Soft tissues and spinal canal: No prevertebral fluid or swelling. No visible canal hematoma. Disc levels: Cervical spondylosis with multilevel disc space narrowing, disc bulges/central disc protrusions, uncovertebral hypertrophy and facet arthropathy. Disc space narrowing is greatest at C6-C7 (moderate to advanced at this level). No appreciable high-grade spinal canal stenosis. Multilevel bony neural foraminal narrowing. Degenerative changes also present at the C1-C2 articulation. Multilevel ventral osteophytes. Upper chest: No consolidation within the imaged lung apices. No visible pneumothorax. IMPRESSION: CT head: 1.  No evidence of an acute intracranial abnormality. 2. Parenchymal atrophy and chronic small vessel ischemic disease. 3. Chronic severe left sphenoid sinusitis. CT  cervical spine: 1. No evidence of an acute cervical spine fracture. 2. Nonspecific reversal of the expected cervical lordosis. 3. Grade 1 anterolisthesis at C3-C4, C4-C5 and C5-C6, unchanged from the prior cervical spine CT of 12/05/2022. 4. Cervical spondylosis as described. 5. Facet ankylosis on the right at C3-C4. Electronically Signed   By: Bascom Lily D.O.   On: 08/21/2023 09:29   CT CERVICAL SPINE WO CONTRAST Result Date: 08/21/2023 CLINICAL DATA:  Provided history: Head trauma, moderate/severe. Polytrauma, blunt. Additional history provided: Fall. EXAM: CT HEAD WITHOUT CONTRAST CT CERVICAL SPINE WITHOUT CONTRAST TECHNIQUE: Multidetector CT imaging of the head and cervical spine was performed following the standard protocol without intravenous contrast. Multiplanar CT image reconstructions of the cervical spine were also generated. RADIATION DOSE REDUCTION: This exam was performed according to the departmental dose-optimization program which includes automated exposure control, adjustment of the mA and/or kV according to patient size and/or use of iterative reconstruction technique. COMPARISON:  Head CT 12/05/2022.  Cervical spine CT 12/05/2022. FINDINGS: CT HEAD FINDINGS Brain: Generalized cerebral atrophy. 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 Background moderate-to-advanced patchy and ill-defined hypoattenuation within the cerebral white matter, nonspecific but compatible with chronic small vessel ischemic disease. There is no acute intracranial hemorrhage. No demarcated cortical infarct. No extra-axial fluid collection. No evidence of an intracranial mass. No midline shift. Vascular: No hyperdense vessel.  Atherosclerotic calcifications. Skull: No calvarial fracture or aggressive osseous lesion. Sinuses/Orbits: No mass or acute finding within the imaged orbits. 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 CT CERVICAL SPINE  FINDINGS Alignment: Nonspecific reversal of the expected cervical or doses. 2 mm C3-C4 grade 1 anterolisthesis. 3 mm C4-C5 grade 1 anterolisthesis. 2 mm C5-C6 grade 1 anterolisthesis. Skull base and vertebrae: The basion-dental and atlanto-dental intervals are maintained.No evidence of acute fracture to the cervical spine. Facet ankylosis on the right at C3-C4. Soft tissues and spinal canal: No prevertebral fluid or swelling. No visible canal hematoma. Disc levels: Cervical spondylosis with multilevel disc space narrowing, disc bulges/central disc protrusions, uncovertebral hypertrophy and facet arthropathy. Disc space narrowing is greatest at C6-C7 (moderate to advanced at this level). No appreciable high-grade spinal canal stenosis. Multilevel bony neural foraminal narrowing. Degenerative changes also present at the C1-C2 articulation. Multilevel ventral osteophytes. Upper chest: No consolidation within the imaged lung apices. No visible pneumothorax. IMPRESSION: CT head: 1.  No evidence of an acute intracranial abnormality. 2. Parenchymal atrophy and chronic small vessel ischemic disease. 3. Chronic severe left sphenoid sinusitis. CT cervical spine: 1. No evidence of an acute cervical spine fracture. 2. Nonspecific reversal of the expected cervical lordosis. 3. Grade 1 anterolisthesis at C3-C4, C4-C5 and C5-C6, unchanged from the prior cervical spine CT of 12/05/2022. 4. Cervical spondylosis as described. 5. Facet ankylosis on the right at C3-C4. Electronically Signed   By: Bascom Lily D.O.   On: 08/21/2023 09:29     Discharge Exam: Vitals:   09/10/23 1952 09/11/23 0445  BP: (!) 149/75 (!) 146/63  Pulse: 85 83  Resp: 17   Temp: 99.9 F (37.7 C) 99 F (37.2 C)  SpO2: 92% 98%   Vitals:   09/10/23 0501 09/10/23 1338 09/10/23 1952 09/11/23 0445  BP: (!) 154/81 130/64 (!) 149/75 (!) 146/63  Pulse: 81 78 85 83  Resp:  14 17   Temp: 98.2 F (36.8 C) 99.9 F (37.7 C) 99.9 F (37.7 C) 99 F (37.2  C)  TempSrc: Axillary Oral Axillary Axillary  SpO2: 98% 94% 92% 98%  Weight:      Height:        General: Pt is alert, awake, not in acute distress Cardiovascular: RRR, S1/S2 +, no rubs, no gallops Respiratory: CTA bilaterally, no wheezing, no rhonchi Abdominal: Soft, NT, ND, bowel sounds + Extremities: no edema, no cyanosis    The results of significant diagnostics from this hospitalization (including imaging, microbiology, ancillary and laboratory) are listed below for reference.     Microbiology: Recent Results (from the past 240 hours)  Blood culture (routine x 2)     Status: None   Collection Time: 09/06/23 10:14 PM   Specimen: Left Antecubital; Blood  Result Value Ref Range Status   Specimen Description LEFT ANTECUBITAL  Final   Special Requests AEROBIC BOTTLE ONLY Blood Culture adequate volume  Final   Culture   Final    NO GROWTH 5 DAYS Performed at [facility], [address], [place], [place] [address]  Report Status 09/11/2023 FINAL  Final  Blood culture (routine x 2)     Status: None   Collection Time: 09/06/23 10:15 PM   Specimen: Right Antecubital; Blood  Result Value Ref Range Status   Specimen Description RIGHT ANTECUBITAL  Final   Special Requests AEROBIC BOTTLE ONLY Blood Culture adequate volume  Final   Culture   Final    NO GROWTH 5 DAYS Performed at [facility], [address] [place], [place] [address]    Report Status 09/11/2023 FINAL  Final     Labs: BNP (last 3 results) Recent Labs    09/06/23 1914  BNP 90.0   Basic Metabolic Panel: Recent Labs  Lab 09/06/23 1914 09/07/23 0528 09/08/23 0237 09/09/23 0632 09/11/23 0423  NA 132* 135 137 136 143  K 2.6* 3.0* 2.8* 2.7* 3.0*  CL 100 108 111 101 112*  CO2 22 21* 20* 24 25  GLUCOSE 185* 125* 106* 123* 116*  BUN 27* 28* 23 22 24*  CREATININE 1.06 0.94 0.75 0.75 1.04  CALCIUM 8.0* 7.4* 7.6* 7.6* 8.1*  MG  --   --  2.0  --  2.0   Liver Function Tests: Recent Labs  Lab  09/06/23 1914  AST 54*  ALT 8  ALKPHOS 72  BILITOT 1.4*  PROT 6.3*  ALBUMIN 2.9*   No results for input(s): "LIPASE", "AMYLASE" in the last 168 hours. No results for input(s): "AMMONIA" in the last 168 hours. CBC: Recent Labs  Lab 09/06/23 1914 09/07/23 0700 09/08/23 0237  WBC 30.0* 21.4* 15.3*  NEUTROABS 26.9*  --   --   HGB 12.0* 10.6* 11.0*  HCT 35.7* 30.4* 33.1*  MCV 97.0 95.0 99.7  PLT 437* 426* 456*   Cardiac Enzymes: No results for input(s): "CKTOTAL", "CKMB", "CKMBINDEX", "TROPONINI" in the last 168 hours. BNP: Invalid input(s): "POCBNP" CBG: Recent Labs  Lab 09/10/23 1113 09/10/23 1639 09/10/23 1957 09/11/23 0734 09/11/23 1123  GLUCAP 144* 110* 143* 112* 121*   D-Dimer No results for input(s): "DDIMER" in the last 72 hours. Hgb A1c No results for input(s): "HGBA1C" in the last 72 hours. Lipid Profile No results for input(s): "CHOL", "HDL", "LDLCALC", "TRIG", "CHOLHDL", "LDLDIRECT" in the last 72 hours. Thyroid  function studies No results for input(s): "TSH", "T4TOTAL", "T3FREE", "THYROIDAB" in the last 72 hours.  Invalid input(s): "FREET3" Anemia work up No results for input(s): "VITAMINB12", "FOLATE", "FERRITIN", "TIBC", "IRON", "RETICCTPCT" in the last 72 hours. Urinalysis    Component Value Date/Time   COLORURINE AMBER (A) 09/06/2023 2230   APPEARANCEUR CLOUDY (A) 09/06/2023 2230   LABSPEC 1.024 09/06/2023 2230   PHURINE 5.0 09/06/2023 2230   GLUCOSEU NEGATIVE 09/06/2023 2230   HGBUR MODERATE (A) 09/06/2023 2230   BILIRUBINUR NEGATIVE 09/06/2023 2230   KETONESUR 5 (A) 09/06/2023 2230   PROTEINUR 100 (A) 09/06/2023 2230   UROBILINOGEN 0.2 09/05/2014 2130   NITRITE NEGATIVE 09/06/2023 2230   LEUKOCYTESUR LARGE (A) 09/06/2023 2230   Sepsis Labs Recent Labs  Lab 09/06/23 1914 09/07/23 0700 09/08/23 0237  WBC 30.0* 21.4* 15.3*   Microbiology Recent Results (from the past 240 hours)  Blood culture (routine x 2)     Status: None    Collection Time: 09/06/23 10:14 PM   Specimen: Left Antecubital; Blood  Result Value Ref Range Status   Specimen Description LEFT ANTECUBITAL  Final   Special Requests AEROBIC BOTTLE ONLY Blood Culture adequate volume  Final   Culture   Final    NO GROWTH 5 DAYS Performed  at [facility], [address], [place], [place] [address]    Report Status 09/11/2023 FINAL  Final  Blood culture (routine x 2)     Status: None   Collection Time: 09/06/23 10:15 PM   Specimen: Right Antecubital; Blood  Result Value Ref Range Status   Specimen Description RIGHT ANTECUBITAL  Final   Special Requests AEROBIC BOTTLE ONLY Blood Culture adequate volume  Final   Culture   Final    NO GROWTH 5 DAYS Performed at [facility], [address] [place], [place] [address]    Report Status 09/11/2023 FINAL  Final     Time coordinating discharge: 35 minutes  SIGNED:   Cornelius Dill, DO [facility] 09/12/2023, 1:53 PM  If 7PM-7AM, please contact night-coverage [email or web address]

## 2023-09-12 NOTE — TOC Transition Note (Addendum)
 Transition of Care [facility]) - Discharge Note   Patient Details  Name: Mark Brady MRN: [id number] Date of Birth: October 20, 1929  [facility] CM/SW Contact:  Geraldina Klinefelter, RN Phone Number: 09/12/2023, 4:37 PM   Clinical Narrative:    Per palliative consult: Home with [facility] hospice services.  Anticipate transition to residential hospice at [facility] if needed, do not rehospitalize.  End-of-life order set implemented DNR/goldenrod form completed and placed on chart.  Referral sent to [facility] for home hospice. [facility] has delivered hospital bed and DME. RN has called EMS for transport. TOC signing off.   Final next level of care: Skilled Nursing Facility Barriers to Discharge: Continued Medical Work up   Patient Goals and CMS Choice Patient states their goals for this hospitalization and ongoing recovery are:: [employer] Medicare.gov Compare Post Acute Care list provided to:: Patient Choice offered to / list presented to : Patient [place] ownership interest in [facility].provided to:: Spouse    Discharge Placement                       Discharge Plan and Services Additional resources added to the After Visit Summary for       Post Acute Care Choice: Skilled Nursing Facility                               Social Drivers of Health (SDOH) Interventions SDOH Screenings   Food Insecurity: No Food Insecurity (09/07/2023)  Housing: Low Risk  (09/07/2023)  Transportation Needs: No Transportation Needs (09/07/2023)  Utilities: Not At Risk (09/07/2023)  Alcohol Screen: Low Risk  (05/17/2023)  Depression (PHQ2-9): Low Risk  (05/29/2023)  Financial Resource Strain: Low Risk  (05/17/2023)  Physical Activity: Insufficiently Active (05/17/2023)  Social Connections: Unknown (09/07/2023)  Stress: No Stress Concern Present (05/17/2023)  Tobacco Use: Low Risk  (09/11/2023)  Health Literacy: Patient Declined (05/17/2023)     Readmission  Risk Interventions    08/29/2023   10:10 AM  Readmission Risk Prevention Plan  Transportation Screening Complete  PCP or Specialist Appt within 3-5 Days Complete  HRI or Home Care Consult Complete  Social Work Consult for Recovery Care Planning/Counseling Complete  Palliative Care Screening Not Applicable  Medication Review [employer]) Complete

## 2023-09-13 ENCOUNTER — Telehealth: Payer: Self-pay | Admitting: *Deleted

## 2023-09-15 NOTE — Transitions of Care (Post Inpatient/ED Visit) (Signed)
     Name: Mark Brady MRN: [id number] DOB: 1929-05-30  Telephone call to [facility], spoke with Marliss Simple who reports pt has been admitted to home hospice services.     Cecilie Coffee [facility], BSN RN Care Manager/ Transition of Care [place]/ [facility] [phone]

## 2023-09-15 DEATH — deceased

## 2023-10-09 ENCOUNTER — Ambulatory Visit: Admitting: Urology

## 2023-10-24 ENCOUNTER — Ambulatory Visit: Payer: Medicare Other | Admitting: Family Medicine

## 2023-10-31 ENCOUNTER — Ambulatory Visit: Admitting: Family Medicine

## 2024-01-14 ENCOUNTER — Ambulatory Visit: Admitting: Neurology

## 2024-05-29 ENCOUNTER — Ambulatory Visit: Payer: Medicare Other
